# Patient Record
Sex: Male | Born: 1935
Health system: Southern US, Community
[De-identification: ages and names within clinical notes are randomized; demographics above are authoritative.]

## PROBLEM LIST (undated history)

## (undated) DIAGNOSIS — M1990 Unspecified osteoarthritis, unspecified site: Secondary | ICD-10-CM

## (undated) DIAGNOSIS — I10 Essential (primary) hypertension: Secondary | ICD-10-CM

## (undated) DIAGNOSIS — H269 Unspecified cataract: Secondary | ICD-10-CM

## (undated) DIAGNOSIS — J439 Emphysema, unspecified: Secondary | ICD-10-CM

## (undated) DIAGNOSIS — H409 Unspecified glaucoma: Secondary | ICD-10-CM

## (undated) HISTORY — PX: EYE SURGERY: SHX253

## (undated) HISTORY — DX: Unspecified cataract: H26.9

## (undated) HISTORY — DX: Emphysema, unspecified: J43.9

## (undated) HISTORY — DX: Unspecified glaucoma: H40.9

---
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
## 2001-01-08 HISTORY — PX: HERNIA REPAIR: SHX51

## 2002-05-15 ENCOUNTER — Ambulatory Visit (HOSPITAL_COMMUNITY): Admission: RE | Admit: 2002-05-15 | Discharge: 2002-05-15 | Payer: Self-pay | Admitting: General Surgery

## 2004-06-01 ENCOUNTER — Ambulatory Visit (HOSPITAL_COMMUNITY): Admission: RE | Admit: 2004-06-01 | Discharge: 2004-06-01 | Payer: Self-pay | Admitting: Gastroenterology

## 2004-10-27 ENCOUNTER — Encounter: Admission: RE | Admit: 2004-10-27 | Discharge: 2004-10-27 | Payer: Self-pay | Admitting: Internal Medicine

## 2009-11-18 ENCOUNTER — Ambulatory Visit: Payer: Self-pay | Admitting: Oncology

## 2009-12-09 LAB — CBC WITH DIFFERENTIAL/PLATELET
BASO%: 0.5 % (ref 0.0–2.0)
Basophils Absolute: 0 10*3/uL (ref 0.0–0.1)
EOS%: 4.1 % (ref 0.0–7.0)
Eosinophils Absolute: 0.3 10*3/uL (ref 0.0–0.5)
HCT: 41.3 % (ref 38.4–49.9)
HGB: 13.9 g/dL (ref 13.0–17.1)
LYMPH%: 17.4 % (ref 14.0–49.0)
MCH: 30 pg (ref 27.2–33.4)
MCHC: 33.6 g/dL (ref 32.0–36.0)
MCV: 89.4 fL (ref 79.3–98.0)
MONO#: 0.5 10*3/uL (ref 0.1–0.9)
MONO%: 8.4 % (ref 0.0–14.0)
NEUT#: 4.2 10*3/uL (ref 1.5–6.5)
NEUT%: 69.6 % (ref 39.0–75.0)
Platelets: 132 10*3/uL — ABNORMAL LOW (ref 140–400)
RBC: 4.62 10*6/uL (ref 4.20–5.82)
RDW: 13.4 % (ref 11.0–14.6)
WBC: 6.1 10*3/uL (ref 4.0–10.3)
lymph#: 1.1 10*3/uL (ref 0.9–3.3)

## 2009-12-09 LAB — TECHNOLOGIST REVIEW

## 2009-12-09 LAB — CHCC SMEAR

## 2010-05-26 NOTE — Op Note (Signed)
NAME:  [name], [name]              ACCOUNT NO.:  [id number]   MEDICAL RECORD NO.:  [id number]          PATIENT TYPE:  AMB   LOCATION:  ENDO                         FACILITY:  [facility]   PHYSICIAN:  Danise Edge, M.D.   DATE OF BIRTH:  [birth year]   DATE OF PROCEDURE:  06/01/2004  DATE OF DISCHARGE:                                 OPERATIVE REPORT   PROCEDURE:  Screening colonoscopy.   PROCEDURE INDICATIONS:  [name] is a 76 year old male born Nov 18, 1934. Danny Gross is scheduled to undergo his first screening  colonoscopy with polypectomy to prevent colon cancer.   ENDOSCOPIST:  Danise Edge, M.D.   PREMEDICATION:  Versed 4 mg and Demerol 60 mg.   PROCEDURE:  After obtaining informed consent, [name] was placed in the  left lateral decubitus position.  I administered intravenous Demerol and  intravenous Versed to achieve conscious sedation for the procedure. The  patient's blood pressure, oxygen saturation and cardiac rhythm were  monitored throughout the procedure and documented in the medical record.   Anal inspection was normal. Digital rectal exam reveals a non nodular  prostate. The Olympus adjustable pediatric colonoscope was introduced into  the rectum and advanced to the cecum. A normal-appearing ileocecal valve and  appendiceal orifice were identified. Colonic preparation for the exam today  was excellent.   Rectum normal.  Retroflexed view of the distal rectum normal.  Sigmoid colon and descending colon normal.  Splenic flexure normal.  Transverse colon normal.  Hepatic flexure normal.  Ascending colon normal.  Cecum and ileocecal valve normal.   ASSESSMENT:  Normal screening proctocolonoscopy to the cecum.      MJ/MEDQ  D:  06/01/2004  T:  06/01/2004  Job:  [id number]   cc:   Georgann Housekeeper, MD  [address]  [place]  [place] [address]  Fax: [phone]

## 2013-01-08 DIAGNOSIS — J189 Pneumonia, unspecified organism: Secondary | ICD-10-CM

## 2013-01-08 HISTORY — DX: Pneumonia, unspecified organism: J18.9

## 2013-06-17 ENCOUNTER — Other Ambulatory Visit: Payer: Self-pay | Admitting: Internal Medicine

## 2013-06-17 DIAGNOSIS — R911 Solitary pulmonary nodule: Secondary | ICD-10-CM

## 2013-07-01 ENCOUNTER — Ambulatory Visit
Admission: RE | Admit: 2013-07-01 | Discharge: 2013-07-01 | Disposition: A | Discharge status: Home / Self Care | Payer: Medicare PPO | Source: Ambulatory Visit | Attending: Internal Medicine | Admitting: Internal Medicine

## 2013-07-01 DIAGNOSIS — R911 Solitary pulmonary nodule: Secondary | ICD-10-CM

## 2013-07-13 ENCOUNTER — Ambulatory Visit (INDEPENDENT_AMBULATORY_CARE_PROVIDER_SITE_OTHER): Payer: Medicare PPO | Admitting: Internal Medicine

## 2013-07-13 ENCOUNTER — Encounter: Payer: Self-pay | Admitting: Internal Medicine

## 2013-07-13 ENCOUNTER — Other Ambulatory Visit (INDEPENDENT_AMBULATORY_CARE_PROVIDER_SITE_OTHER): Payer: Medicare PPO

## 2013-07-13 VITALS — BP 114/80 | HR 65 | Temp 97.9°F | Ht 70.0 in | Wt 142.0 lb

## 2013-07-13 DIAGNOSIS — R918 Other nonspecific abnormal finding of lung field: Secondary | ICD-10-CM

## 2013-07-13 LAB — CBC WITH DIFFERENTIAL/PLATELET
Basophils Absolute: 0 10*3/uL (ref 0.0–0.1)
Basophils Relative: 0.5 % (ref 0.0–3.0)
Eosinophils Absolute: 0.3 10*3/uL (ref 0.0–0.7)
Eosinophils Relative: 4.7 % (ref 0.0–5.0)
HCT: 41.6 % (ref 39.0–52.0)
Hemoglobin: 13.6 g/dL (ref 13.0–17.0)
Lymphocytes Relative: 18.1 % (ref 12.0–46.0)
Lymphs Abs: 1.1 10*3/uL (ref 0.7–4.0)
MCHC: 32.7 g/dL (ref 30.0–36.0)
MCV: 89.1 fl (ref 78.0–100.0)
Monocytes Absolute: 0.6 10*3/uL (ref 0.1–1.0)
Monocytes Relative: 9.7 % (ref 3.0–12.0)
Neutro Abs: 4 10*3/uL (ref 1.4–7.7)
Neutrophils Relative %: 67 % (ref 43.0–77.0)
Platelets: 120 10*3/uL — ABNORMAL LOW (ref 150.0–400.0)
RBC: 4.67 Mil/uL (ref 4.22–5.81)
RDW: 14 % (ref 11.5–15.5)
WBC: 6 10*3/uL (ref 4.0–10.5)

## 2013-07-13 LAB — SEDIMENTATION RATE: Sed Rate: 14 mm/hr (ref 0–22)

## 2013-07-13 NOTE — Patient Instructions (Addendum)
Please remember to go to the lab department downstairs for your tests - we will call you with the results when they are available.  Please schedule a follow up office visit in 2 weeks, sooner if needed with a cxr. Late add :  cxr's since onset of illness needed on disc with f/u ov

## 2013-07-13 NOTE — Progress Notes (Signed)
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 Pulmonary Consult    Referred per Danny. Christene SlatesKarar Gross for eval of MPN. Pt states had PNA in June 2015.  He c/o minimal cough- prod with minimal tan sputum.   winter 2014 bad bronchitis 100% better Dec 2014 bad bronchitis rx ? zpak > better but nagging cough then started taking allergy meds in May 2015 for fatgue he assoc with gardening  no better>   then abruptly felt ill when tried to get up from prolonged sitting during  first week in June main c/o was weak legs, but also noted   Pain both sides of lower chest and back, and fever 101 > UC rx avelox > 100% better including nagging cough better but fatigue 75% better at most. Appetite back to baseline  No obvious other patterns in day to day or daytime variabilty or assoc chronic cough or cp or chest tightness, subjective wheeze overt sinus or hb symptoms. No unusual exp hx or h/o childhood pna/ asthma or knowledge of premature birth.  Sleeping ok without nocturnal  or early am exacerbation  of respiratory  c/o's or need for noct saba. Also denies any obvious fluctuation of symptoms with weather or environmental changes or other aggravating or alleviating factors except as outlined above   Current Medications, Allergies, Complete Past Medical History, Past Surgical History, Family History, and Social History were reviewed in [facility] electronic medical record.           Review of Systems  Constitutional: Positive for appetite change and unexpected weight change. Negative for fever, chills and activity change.  HENT: Negative for congestion, dental problem, postnasal drip, rhinorrhea, sneezing, sore throat, trouble  swallowing and voice change.   Eyes: Negative for visual disturbance.  Respiratory: Positive for cough. Negative for choking and shortness of breath.   Cardiovascular: Negative for chest pain and leg swelling.  Gastrointestinal: Negative for nausea, vomiting and abdominal pain.  Genitourinary: Negative for difficulty urinating.  Musculoskeletal: Negative for arthralgias.  Skin: Negative for rash.  Psychiatric/Behavioral: Negative for behavioral problems and confusion.       Objective:   Physical Exam   Somber thin amb wm nad  Wt Readings from Last 3 Encounters:  07/13/13 142 lb (64.411 kg)      HEENT: nl dentition, turbinates, and orophanx. Nl external ear canals without cough reflex   NECK :  without JVD/Nodes/TM/ nl carotid upstrokes bilaterally   LUNGS: no acc muscle use, unusual pectus deformity bilaterally abut clear to A and P bilaterally without cough on insp or exp maneuvers   CV:  RRR  no s3 or murmur or increase in P2, no edema   ABD:  soft and nontender with nl excursion in the supine position. No bruits or organomegaly, bowel sounds nl  MS:  warm without deformities, calf tenderness, cyanosis or clubbing  SKIN: warm and dry without lesions    NEURO:  alert, approp, no deficits    CT chest w/o contrast 06/17/13 1. Opacity within the left lower lobe with a nodular character with  multiple nodules scattered throughout the lungs as well. These  findings could  be due to an infectious or inflammatory process, but  a malignancy cannot be excluded. If this does not respond  appropriately to therapy, then bronchoscopy and/or percutaneous  biopsy may be warranted.  2. No definite mediastinal or hilar adenopathy            Assessment & Plan:

## 2013-07-13 NOTE — Progress Notes (Signed)
Quick Note:  Spoke with pt and notified of results per Dr. Wert. Pt verbalized understanding and denied any questions.  ______ 

## 2013-07-13 NOTE — Assessment & Plan Note (Addendum)
-  See CT chest 07/01/13  - ESR 07/13/2013 = 14  In the absence of a smoking hx and given abrupt onset of febrile illness that appears to have responded nicely to avelox these changes on Ct are most c/w organizing pna that is improving on it's own and should not require further w/u or rx assuming he goes back to 100% baseline fnx and we see radiographic improvement (though perhaps not to 100%) esp in the  RML  being difficult to completely clear p CAP.  Since we have an abn baseline cxr then just need to do apples to apples comparison and forego the CT's for now  Discussed in detail all the  indications, usual  risks and alternatives  relative to the benefits with patient who agrees to proceed with conservative f/u as outlined  See instructions for specific recommendations which were reviewed directly with the patient who was given a copy with highlighter outlining the key components.

## 2013-07-14 ENCOUNTER — Telehealth: Payer: Self-pay | Admitting: *Deleted

## 2013-07-14 NOTE — Telephone Encounter (Signed)
Spoke with pt and notified of recs per Dr. Sherene SiresWert. Pt verbalized understanding and denied any questions. Will try and bring cd with cxrs

## 2013-07-14 NOTE — Telephone Encounter (Signed)
Message copied by Christen ButterASKIN, Johnross Nabozny M on Tue Jul 14, 2013  3:57 PM ------      Message from: Sandrea HughsWERT, MICHAEL B      Created: Mon Jul 13, 2013 10:52 PM       If possible we need the previous cxr's on disc at next ov (the ones done when he was sick) for apples to apples comparison ------

## 2013-07-27 ENCOUNTER — Ambulatory Visit (INDEPENDENT_AMBULATORY_CARE_PROVIDER_SITE_OTHER): Payer: Medicare PPO | Admitting: Internal Medicine

## 2013-07-27 ENCOUNTER — Other Ambulatory Visit (INDEPENDENT_AMBULATORY_CARE_PROVIDER_SITE_OTHER): Payer: Medicare PPO

## 2013-07-27 ENCOUNTER — Encounter: Payer: Self-pay | Admitting: Internal Medicine

## 2013-07-27 ENCOUNTER — Ambulatory Visit (INDEPENDENT_AMBULATORY_CARE_PROVIDER_SITE_OTHER)
Admission: RE | Admit: 2013-07-27 | Discharge: 2013-07-27 | Disposition: A | Discharge status: Home / Self Care | Payer: Medicare PPO | Source: Ambulatory Visit | Attending: Internal Medicine | Admitting: Internal Medicine

## 2013-07-27 VITALS — BP 120/80 | HR 60 | Temp 97.7°F | Ht 70.0 in | Wt 142.0 lb

## 2013-07-27 DIAGNOSIS — R0600 Dyspnea, unspecified: Secondary | ICD-10-CM | POA: Insufficient documentation

## 2013-07-27 DIAGNOSIS — R0989 Other specified symptoms and signs involving the circulatory and respiratory systems: Secondary | ICD-10-CM

## 2013-07-27 DIAGNOSIS — R918 Other nonspecific abnormal finding of lung field: Secondary | ICD-10-CM

## 2013-07-27 DIAGNOSIS — R0609 Other forms of dyspnea: Secondary | ICD-10-CM

## 2013-07-27 DIAGNOSIS — Z23 Encounter for immunization: Secondary | ICD-10-CM

## 2013-07-27 LAB — BASIC METABOLIC PANEL
BUN: 26 mg/dL — ABNORMAL HIGH (ref 6–23)
CALCIUM: 9.4 mg/dL (ref 8.4–10.5)
CO2: 29 mEq/L (ref 19–32)
Chloride: 104 mEq/L (ref 96–112)
Creatinine, Ser: 1.2 mg/dL (ref 0.4–1.5)
GFR: 60.66 mL/min (ref >60.00)
GLUCOSE: 105 mg/dL — AB (ref 70–99)
Potassium: 5.2 mEq/L — ABNORMAL HIGH (ref 3.5–5.1)
Sodium: 139 mEq/L (ref 135–145)

## 2013-07-27 LAB — TSH: TSH: 1.29 u[IU]/mL (ref 0.35–4.50)

## 2013-07-27 LAB — PROTIME-INR
INR: 1.1 ratio — AB (ref 0.8–1.0)
Prothrombin Time: 12 s (ref 9.6–13.1)

## 2013-07-27 LAB — BRAIN NATRIURETIC PEPTIDE: Pro B Natriuretic peptide (BNP): 138 pg/mL — ABNORMAL HIGH (ref 0.0–100.0)

## 2013-07-27 NOTE — Patient Instructions (Addendum)
Prevnar 13 today   Please remember to go to the lab  department downstairs for your tests - we will call you with the results when they are available.  Tuesday Aug 4th 715 am [facility] outpatient registration with nothing to eat after supper Monday night for bronchoscopy and biopsy.

## 2013-07-27 NOTE — Progress Notes (Signed)
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 Pulmonary Consult    Referred per Dr. Christene SlatesKarar Hussain for eval of MPN. Pt states had PNA in June 2015.  He c/o minimal cough- prod with minimal tan sputum.   winter 2014 bad bronchitis 100% better Dec 2014 bad bronchitis rx ? zpak > better but nagging dry cough then started taking allergy meds in May 2015 for fatgue he assoc with gardening  no better>   then abruptly felt ill when tried to get up from prolonged sitting during  first week in June main c/o was weak legs, but also noted   Pain both sides of lower chest and back, and fever 101 > UC rx avelox > 100% better including nagging cough better but fatigue 75% better at most. Appetite back to baseline. rec Recheck 2 weeks, no change rx      07/27/2013 f/u ov/Arrion Burruel re: chronic cough/ abn cxr  Chief Complaint  Patient presents with  . Follow-up    Cough is unchanged since last visit.   75% improvement,  Remaining 25% = dry cough day = night, not back jogging yet but Not limited by breathing from desired activities    No obvious day to day or daytime variabilty or assoc chronic cough or cp or chest tightness, subjective wheeze overt sinus or hb symptoms. No unusual exp hx or h/o childhood pna/ asthma or knowledge of premature birth.  Sleeping ok without nocturnal  or early am exacerbation  of respiratory  c/o's or need for noct saba. Also denies any obvious fluctuation of symptoms with weather or environmental changes or other aggravating or alleviating factors except as outlined above   Current Medications, Allergies, Complete Past Medical History, Past Surgical History, Family History, and Social  History were reviewed in [facility] electronic medical record.  ROS  The following are not active complaints unless bolded sore throat, dysphagia, dental problems, itching, sneezing,  nasal congestion or excess/ purulent secretions, ear ache,   fever, chills, sweats, unintended wt loss, pleuritic or exertional cp, hemoptysis,  orthopnea pnd or leg swelling, presyncope, palpitations, heartburn, abdominal pain, anorexia, nausea, vomiting, diarrhea  or change in bowel or urinary habits, change in stools or urine, dysuria,hematuria,  rash, arthralgias, visual complaints, headache, numbness weakness or ataxia or problems with walking or coordination,  change in mood/affect or memory.                      Objective:   Physical Exam   Somber thin amb wm nad   Wt Readings from Last 3 Encounters:  07/27/13 142 lb (64.411 kg)  07/13/13 142 lb (64.411 kg)       HEENT: nl dentition, turbinates, and orophanx. Nl external ear canals without cough reflex   NECK :  without JVD/Nodes/TM/ nl carotid upstrokes bilaterally   LUNGS: no acc muscle use, unusual pectus deformity bilaterally abut clear to A and P bilaterally without cough on insp or exp maneuvers   CV:  RRR  no s3 or murmur or increase in P2, no edema   ABD:  soft and nontender with nl excursion  in the supine position. No bruits or organomegaly, bowel sounds nl  MS:  warm without deformities, calf tenderness, cyanosis or clubbing        cxr 06/13/13  LLL vague infiltrate ?post nodule   CXR  07/27/2013 :  Persistent abnormal lung densities in the right middle lobe and left lower lobe, probably similar to the appearance on the previous CT scan. There is probably bronchiectasis and chronic pulmonary inflammation       Lab Results  Component Value Date   ESRSEDRATE 14 07/13/2013                Assessment & Plan:

## 2013-07-28 NOTE — Assessment & Plan Note (Signed)
Chemistry      Component Value Date/Time   NA 139 07/27/2013 1131   K 5.2* 07/27/2013 1131   CL 104 07/27/2013 1131   CO2 29 07/27/2013 1131   BUN 26* 07/27/2013 1131   CREATININE 1.2 07/27/2013 1131      Component Value Date/Time   CALCIUM 9.4 07/27/2013 1131      Lab Results  Component Value Date   WBC 6.0 07/13/2013   HGB 13.6 07/13/2013   HCT 41.6 07/13/2013   MCV 89.1 07/13/2013   PLT 120.0* 07/13/2013    Lab Results  Component Value Date   TSH 1.29 07/27/2013     Lab Results  Component Value Date   PROBNP 138.0* 07/27/2013     The plt problem is longstanding and not an explanation for fatigue or sob. No evidence of chf

## 2013-07-28 NOTE — Assessment & Plan Note (Signed)
-  See CT chest 07/01/13  - ESR 07/13/2013 = 14  He has no baseline cxr and still coughing, fatigued p recurrent  "cap" typical of bronchiectasis but this is not a prominent finding on CT  Discussed in detail all the  indications, usual  risks and alternatives  relative to the benefits with patient who agrees to proceed with bronchoscopy with biopsy on Aug 4

## 2013-08-11 ENCOUNTER — Ambulatory Visit (HOSPITAL_COMMUNITY): Payer: Medicare PPO

## 2013-08-11 ENCOUNTER — Encounter (INDEPENDENT_AMBULATORY_CARE_PROVIDER_SITE_OTHER): Payer: Self-pay

## 2013-08-11 ENCOUNTER — Encounter (HOSPITAL_COMMUNITY)
Admission: RE | Disposition: A | Discharge status: Home / Self Care | Payer: Self-pay | Source: Ambulatory Visit | Attending: Critical Care Medicine

## 2013-08-11 ENCOUNTER — Encounter (HOSPITAL_COMMUNITY): Payer: Self-pay | Admitting: Respiratory Therapy

## 2013-08-11 ENCOUNTER — Telehealth: Payer: Self-pay | Admitting: Internal Medicine

## 2013-08-11 ENCOUNTER — Other Ambulatory Visit: Payer: Self-pay | Admitting: Internal Medicine

## 2013-08-11 ENCOUNTER — Encounter (HOSPITAL_COMMUNITY)
Admission: RE | Disposition: A | Discharge status: Home / Self Care | Payer: Medicare PPO | Source: Ambulatory Visit | Attending: Critical Care Medicine

## 2013-08-11 ENCOUNTER — Encounter: Payer: Self-pay | Admitting: Internal Medicine

## 2013-08-11 ENCOUNTER — Ambulatory Visit (HOSPITAL_COMMUNITY)
Admission: RE | Admit: 2013-08-11 | Discharge: 2013-08-11 | Disposition: A | Discharge status: Home / Self Care | Payer: Medicare PPO | Source: Ambulatory Visit | Attending: Critical Care Medicine | Admitting: Critical Care Medicine

## 2013-08-11 ENCOUNTER — Ambulatory Visit (HOSPITAL_COMMUNITY)
Admission: RE | Admit: 2013-08-11 | Discharge: 2013-08-11 | Disposition: A | Discharge status: Home / Self Care | Payer: Medicare PPO | Source: Ambulatory Visit | Attending: Internal Medicine | Admitting: Internal Medicine

## 2013-08-11 DIAGNOSIS — R05 Cough: Secondary | ICD-10-CM | POA: Insufficient documentation

## 2013-08-11 DIAGNOSIS — J984 Other disorders of lung: Secondary | ICD-10-CM | POA: Diagnosis not present

## 2013-08-11 DIAGNOSIS — R059 Cough, unspecified: Secondary | ICD-10-CM | POA: Diagnosis not present

## 2013-08-11 DIAGNOSIS — R918 Other nonspecific abnormal finding of lung field: Secondary | ICD-10-CM

## 2013-08-11 HISTORY — PX: VIDEO BRONCHOSCOPY: SHX5072

## 2013-08-11 LAB — BODY FLUID CELL COUNT WITH DIFFERENTIAL
Eos, Fluid: 2 %
Lymphs, Fluid: 8 %
Monocyte-Macrophage-Serous Fluid: 9 % — ABNORMAL LOW (ref 50–90)
Neutrophil Count, Fluid: 81 % — ABNORMAL HIGH (ref 0–25)
WBC FLUID: 4410 uL — AB (ref 0–1000)

## 2013-08-11 SURGERY — BRONCHOSCOPY, WITH FLUOROSCOPY
Anesthesia: Moderate Sedation | Laterality: Bilateral

## 2013-08-11 MED ORDER — MIDAZOLAM HCL 10 MG/2ML IJ SOLN: 1.0000 mg | Freq: Once | INTRAMUSCULAR | Status: DC

## 2013-08-11 MED ORDER — LEVOFLOXACIN 500 MG PO TABS: 500.0000 mg | ORAL_TABLET | Freq: Every day | ORAL | Status: DC

## 2013-08-11 MED ORDER — LIDOCAINE HCL 1 % IJ SOLN
INTRAMUSCULAR | Status: DC | PRN
  Administered 2013-08-11: 6 mL via RESPIRATORY_TRACT

## 2013-08-11 MED ORDER — MEPERIDINE HCL 100 MG/ML IJ SOLN: 100.0000 mg | Freq: Once | INTRAMUSCULAR | Status: DC

## 2013-08-11 MED ORDER — PHENYLEPHRINE HCL 0.25 % NA SOLN: 1.0000 | Freq: Four times a day (QID) | NASAL | Status: DC | PRN

## 2013-08-11 MED ORDER — LIDOCAINE HCL 2 % EX GEL: 1.0000 "application " | Freq: Once | CUTANEOUS | Status: DC

## 2013-08-11 MED ORDER — LIDOCAINE HCL 2 % EX GEL
CUTANEOUS | Status: DC | PRN
  Administered 2013-08-11: 1

## 2013-08-11 MED ORDER — PHENYLEPHRINE HCL 0.25 % NA SOLN
NASAL | Status: DC | PRN
  Administered 2013-08-11: 1 via NASAL

## 2013-08-11 MED ORDER — SODIUM CHLORIDE 0.9 % IV SOLN
INTRAVENOUS | Status: DC
  Administered 2013-08-11: 08:00:00 via INTRAVENOUS

## 2013-08-11 MED ORDER — MIDAZOLAM HCL 10 MG/2ML IJ SOLN
INTRAMUSCULAR | Status: AC
  Filled 2013-08-11: qty 4

## 2013-08-11 MED ORDER — MEPERIDINE HCL 100 MG/ML IJ SOLN
INTRAMUSCULAR | Status: AC
  Filled 2013-08-11: qty 2

## 2013-08-11 MED ORDER — MIDAZOLAM HCL 10 MG/2ML IJ SOLN
INTRAMUSCULAR | Status: DC | PRN
  Administered 2013-08-11: 2.5 mg via INTRAVENOUS

## 2013-08-11 NOTE — Discharge Instructions (Signed)
°  Flexible Bronchoscopy, Care After These instructions give you information on caring for yourself after your procedure. Your doctor may also give you more specific instructions. Call your doctor if you have any problems or questions after your procedure. HOME CARE  Do not eat or drink anything for 2 hours after your procedure. If you try to eat or drink before the medicine wears off, food or drink could go into your lungs. You could also burn yourself.  After 2 hours have passed and when you can cough and gag normally, you may eat soft food and drink liquids slowly.  The day after the test, you may eat your normal diet.  You may do your normal activities.  Keep all doctor visits. GET HELP RIGHT AWAY IF:  You get more and more short of breath.  You get light-headed.  You feel like you are going to pass out (faint).  You have chest pain.  You have new problems that worry you.  You cough up more than a little blood.  You cough up more blood than before. MAKE SURE YOU:  Understand these instructions.  Will watch your condition.  Will get help right away if you are not doing well or get worse. Document Released: 10/22/2008 Document Revised: 12/30/2012 Document Reviewed: 08/29/2012 [facility] Patient Information 2015 [employer] [place]. This information is not intended to replace advice given to you by your health care provider. Make sure you discuss any questions you have with your health care provider.  Nothing to eat or drink until   11:00 AM   Today 08/11/2013

## 2013-08-11 NOTE — Telephone Encounter (Signed)
Called made pt aware of recs. Nothing further needed 

## 2013-08-11 NOTE — Telephone Encounter (Signed)
Called spoke with Harol. She reports about 1/2 hr ago pt has had fever of 101.2. Pt has not taken anything for the fever bc they were told pt could not take any medications until MW gave the okay. Please advise thanks

## 2013-08-11 NOTE — H&P (Signed)
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  Pulmonary Consult       Referred per Dr. Lacie Scotts for eval of MPN. Pt states had PNA in June 2015.  He c/o minimal cough- prod with minimal tan sputum.     winter 2014 bad bronchitis 100% better Dec 2014 bad bronchitis rx ? zpak > better but nagging dry cough then started taking allergy meds in May 2015 for fatgue he assoc with gardening  no better>   then abruptly felt ill when tried to get up from prolonged sitting during  first week in June main c/o was weak legs, but also noted   Pain both sides of lower chest and back, and fever 101 > UC rx avelox > 100% better including nagging cough better but fatigue 75% better at most. Appetite back to baseline. rec Recheck 2 weeks, no change rx         07/27/2013 f/u ov/Danny Gross re: chronic cough/ abn cxr   Chief Complaint   Patient presents with   .  Follow-up       Cough is unchanged since last visit.     75% improvement,  Remaining 25% = dry cough day = night, not back jogging yet but Not limited by breathing from desired activities     No obvious day to day or daytime variabilty or assoc chronic cough or cp or chest tightness, subjective wheeze overt sinus or hb symptoms. No unusual exp hx or h/o childhood pna/ asthma or knowledge of premature birth.   Sleeping ok without nocturnal  or early am exacerbation  of respiratory  c/o's or need for noct saba. Also denies any obvious fluctuation of symptoms with weather or environmental changes or other aggravating or alleviating factors except as outlined above    Current Medications, Allergies, Complete Past Medical History, Past Surgical History, Family History, and Social History were reviewed in [employer] record.   ROS  The following are not active complaints unless bolded sore throat, dysphagia, dental problems, itching, sneezing,  nasal congestion or excess/ purulent secretions, ear ache,   fever, chills, sweats, unintended wt loss, pleuritic or exertional cp, hemoptysis,  orthopnea pnd or leg swelling, presyncope, palpitations, heartburn, abdominal pain, anorexia, nausea, vomiting, diarrhea  or change in bowel or urinary habits, change in stools or urine, dysuria,hematuria,  rash, arthralgias, visual complaints, headache, numbness weakness or ataxia or problems with walking or coordination,  change in mood/affect or memory.                            Objective:     Physical Exam     Somber thin amb wm nad      Wt Readings from Last 3 Encounters:   07/27/13  142 lb (64.411 kg)   07/13/13  142 lb (64.411 kg)           HEENT: nl dentition, turbinates, and orophanx. Nl external ear canals without cough reflex     NECK :  without JVD/Nodes/TM/ nl carotid upstrokes bilaterally     LUNGS: no acc muscle use, unusual pectus deformity bilaterally abut clear to A and P bilaterally without cough  on insp or exp maneuvers     CV:  RRR  no s3 or murmur or increase in P2, no edema    ABD:  soft and nontender with nl excursion in the supine position. No bruits or organomegaly, bowel sounds nl   MS:  warm without deformities, calf tenderness, cyanosis or clubbing            cxr 06/13/13   LLL vague infiltrate ?post nodule     CXR  07/27/2013 :  Persistent abnormal lung densities in the right middle lobe and left lower lobe, probably similar to the appearance on the previous CT scan. There is probably bronchiectasis and chronic pulmonary inflammation           Lab Results   Component  Value  Date     ESRSEDRATE  14  07/13/2013                           Assessment & Plan:               Pulmonary infiltrates     - ESR  07/13/2013 = 14  He has no baseline cxr and still coughing, fatigued p recurrent  "cap" typical of bronchiectasis but this is not a prominent finding on CT   Discussed in detail all the  indications, usual  risks and alternatives  relative to the benefits with patient who agrees to proceed with bronchoscopy with biopsy on Aug 4    08/11/2013 day of FOB/ no change in hx or exam   Christinia Gully, MD Pulmonary and [place] [phone] After 5:30 PM or weekends, call [phone]

## 2013-08-11 NOTE — Telephone Encounter (Signed)
Fine to take tylenol and plenty of fluids but this is still not at all unexpected and on levaquin there are no bugs that aresn't well covered.

## 2013-08-11 NOTE — Telephone Encounter (Signed)
Called and spoke with Danny Gross and she stated that the pt has a  Temp now of 102.0 and wanted to know what she can give him for this.  MW please advise. thanks

## 2013-08-11 NOTE — Progress Notes (Signed)
Video Bronchoscopy done  Intervention Bronchial washing done Intervention Bronchial biopsy done  Procedure tolerated well 

## 2013-08-11 NOTE — Op Note (Signed)
Bronchoscopy Procedure Note  Date of Operation: 08/11/2013   Pre-op Diagnosis: ? MAI  Post-op Diagnosis: same  Surgeon: Sandrea HughsMichael Maurene Hollin  Anesthesia: Monitored Local Anesthesia with Sedation  Operation: Video Flexible fiberoptic bronchoscopy, diagnostic   Findings: copious bilateral mucopurulent secretions  Specimen: BAL LLL and Tbbx LLL  Estimated Blood Loss: min   Complications: None  Indications and History: See updated H and P same date. The risks, benefits, complications, treatment options and expected outcomes were discussed with the patient.  The possibilities of reaction to medication, pulmonary aspiration, perforation of a viscus, bleeding, failure to diagnose a condition and creating a complication requiring transfusion or operation were discussed with the patient who freely signed the consent.    Description of Procedure: The patient was re-examined in the bronchoscopy suite and the site of surgery properly noted/marked.  The patient was identified  and the procedure verified as Flexible Fiberoptic Bronchoscopy.  A Time Out was held and the above information confirmed.   After the induction of topical nasopharyngeal anesthesia, the patient was positioned  and the bronchoscope was passed through the R naris. The vocal cords were visualized and  1% buffered lidocaine 5 ml was topically placed onto the cords. The cords were nl. The scope was then passed into the trachea.  1% buffered lidocaine given topically. Airways inspected bilaterally to the subsegmental level with the following findings:   1) copious bilateral mp secretions  2) no focal lesions   Interventions  1) BAL LLL with bloody return  2) Tbbx LLL x 4 by fluoroscopy      The Patient was taken to the Endoscopy Recovery area in satisfactory condition.  Attestation: I performed the procedure.  Sandrea HughsMichael Theresia Pree, MD Pulmonary and Critical Care Medicine [facility] Cell [phone] After 5:30 PM or  weekends, call [phone]

## 2013-08-11 NOTE — Telephone Encounter (Signed)
This is not unusual but given the nasty color of his mucus rec levaquin 500 mg daily x 10 days

## 2013-08-12 ENCOUNTER — Encounter: Payer: Self-pay | Admitting: Internal Medicine

## 2013-08-12 ENCOUNTER — Encounter (HOSPITAL_COMMUNITY): Payer: Self-pay | Admitting: Internal Medicine

## 2013-08-12 ENCOUNTER — Other Ambulatory Visit: Payer: Self-pay | Admitting: Internal Medicine

## 2013-08-12 DIAGNOSIS — R0600 Dyspnea, unspecified: Secondary | ICD-10-CM

## 2013-08-12 NOTE — Telephone Encounter (Signed)
Dr Sherene SiresWert please advise [facility] [place] [facility] that you would recommend the patient see. Thanks.

## 2013-08-13 LAB — CULTURE, RESPIRATORY W GRAM STAIN

## 2013-08-13 LAB — CULTURE, RESPIRATORY

## 2013-08-14 ENCOUNTER — Encounter: Payer: Self-pay | Admitting: Internal Medicine

## 2013-08-14 NOTE — Telephone Encounter (Signed)
Please advise MW thanks 

## 2013-08-14 NOTE — Progress Notes (Signed)
Quick Note:  LMTCB ______ 

## 2013-08-17 NOTE — Progress Notes (Signed)
Quick Note:  LMTCB ______ 

## 2013-08-19 ENCOUNTER — Encounter: Payer: Self-pay | Admitting: Internal Medicine

## 2013-08-19 ENCOUNTER — Encounter: Payer: Self-pay | Admitting: *Deleted

## 2013-08-19 ENCOUNTER — Ambulatory Visit (INDEPENDENT_AMBULATORY_CARE_PROVIDER_SITE_OTHER): Payer: Medicare PPO | Admitting: Internal Medicine

## 2013-08-19 VITALS — BP 112/72 | HR 70 | Temp 97.9°F | Ht 70.0 in | Wt 138.0 lb

## 2013-08-19 DIAGNOSIS — R918 Other nonspecific abnormal finding of lung field: Secondary | ICD-10-CM

## 2013-08-19 MED ORDER — AZITHROMYCIN 250 MG PO TABS: ORAL_TABLET | ORAL | Status: DC

## 2013-08-19 MED ORDER — ETHAMBUTOL HCL 400 MG PO TABS: ORAL_TABLET | ORAL | Status: DC

## 2013-08-19 MED ORDER — FLUTTER DEVI: Status: DC

## 2013-08-19 MED ORDER — ETHAMBUTOL HCL 400 MG PO TABS: 400.0000 mg | ORAL_TABLET | Freq: Every day | ORAL | Status: DC

## 2013-08-19 NOTE — Progress Notes (Signed)
Quick Note:  Will discuss at ov today ______ 

## 2013-08-19 NOTE — Patient Instructions (Addendum)
Zithromax 250 mg one daily   mucinex or mucinex dm up 1200 mg every 12 hours as needed and use your flutter valve  Ethambutol 400 mg twice daily (let your eye doctor know)  Please schedule a follow up office visit in 4 weeks, sooner if needed with pfts and cxr   You should not travel by airplane in meantime

## 2013-08-19 NOTE — Progress Notes (Signed)
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 Pulmonary Consult    Referred per Dr. Christene SlatesKarar Hussain for eval of MPN. Pt states had PNA in June 2015.  He c/o minimal cough- prod with minimal tan sputum.   winter 2014 bad bronchitis 100% better Dec 2014 bad bronchitis rx ? zpak > better but nagging dry cough then started taking allergy meds in May 2015 for fatgue he assoc with gardening  no better>   then abruptly felt ill when tried to get up from prolonged sitting during  first week in June main c/o was weak legs, but also noted   Pain both sides of lower chest and back, and fever 101 > UC rx avelox > 100% better including nagging cough better but fatigue 75% better at most. Appetite back to baseline. rec Recheck 2 weeks, no change rx      07/27/2013 f/u ov/Makaylen Thieme re: chronic cough/ abn cxr  Chief Complaint  Patient presents with  . Follow-up    Cough is unchanged since last visit.   75% improvement,  Remaining 25% = dry cough day = night, not back jogging yet but Not limited by breathing from desired activities   rec Prevnar13 Aug 4 fob > Pos afb/ Pos atypcial cyt with fever 102 > levaquin x 10d    08/19/2013 f/u ov/Laquanda Bick re: Vivi Barrackprob mai  Chief Complaint  Patient presents with  . Follow-up    F/u after bronch. Pt c/o lack of energy. Pt states he has had a fever above 99 degrees since bronch on 8/4 and c/o sweats. Pt states he was afebrile today. Pt c/o prod cough with tan mucous. Denies CP.   finishing up levaquin, mucus much less vol since fob, bloody sputum clearing / no cp and no more fever   No obvious day to day or daytime variabilty or assoc limiting sob or cp or chest tightness,  subjective wheeze overt sinus or hb symptoms. No unusual exp hx or h/o childhood pna/ asthma or knowledge of premature birth.  Sleeping ok without nocturnal  or early am exacerbation  of respiratory  c/o's or need for noct saba. Also denies any obvious fluctuation of symptoms with weather or environmental changes or other aggravating or alleviating factors except as outlined above   Current Medications, Allergies, Complete Past Medical History, Past Surgical History, Family History, and Social History were reviewed in [facility] electronic medical record.  ROS  The following are not active complaints unless bolded sore throat, dysphagia, dental problems, itching, sneezing,  nasal congestion or excess/ purulent secretions, ear ache,   fever, chills, sweats, unintended wt loss, pleuritic or exertional cp, hemoptysis,  orthopnea pnd or leg swelling, presyncope, palpitations, heartburn, abdominal pain, anorexia, nausea, vomiting, diarrhea  or change in bowel or urinary habits, change in stools or urine, dysuria,hematuria,  rash, arthralgias, visual complaints, headache, numbness weakness or ataxia or problems with walking or coordination,  change in mood/affect or memory.                      Objective:   Physical Exam   Somber thin amb wm  nad  Wt Readings from Last 3 Encounters:  08/19/13 138 lb (62.596 kg)  07/27/13 142 lb (64.411 kg)  07/13/13 142 lb (64.411 kg)        HEENT: nl dentition, turbinates, and orophanx. Nl external ear canals without cough reflex   NECK :  without JVD/Nodes/TM/ nl carotid upstrokes bilaterally   LUNGS: no acc muscle use, unusual pectus deformity bilaterally abut clear to A and P bilaterally without cough on insp or exp maneuvers   CV:  RRR  no s3 or murmur or increase in P2, no edema   ABD:  soft and nontender with nl excursion in the supine position. No bruits or organomegaly, bowel sounds nl  MS:  warm without deformities, calf  tenderness, cyanosis or clubbing        cxr 06/13/13  LLL vague infiltrate ?post nodule   CXR  07/27/2013 :  Persistent abnormal lung densities in the right middle lobe and left lower lobe, probably similar to the appearance on the previous CT scan. There is probably bronchiectasis and chronic pulmonary inflammation                    Assessment & Plan:

## 2013-08-19 NOTE — Assessment & Plan Note (Signed)
-  See CT chest 07/01/13  - ESR 07/13/2013 = 14 - FOB 08/11/2013 > copious mp secretions bilaterally > WBC 4410 with 80% polys / 2% eos> 2+ AFB/ small cluster atypical cells/never smoker - Levaquin rx 8/4-14/2015  Most likely this is MAI  I had an extended discussion with the patient today lasting 15 to 20 minutes of a 25 minute visit on the following issues:   1)  MAI pathophys/ not contagious but need to be sure this is atpical tb before travel/ crowd exp 2) Need to promote better mc fxn> flutter valve 3) start zmax/ eth but consider refer to ID if any bug other than MAI, possible 2-3 y 4) need to be sure the infiltrates and clinical syndrome improve on approp rx and if not consider excisional bx as have not proven benignancy  See instructions for specific recommendations which were reviewed directly with the patient who was given a copy with highlighter outlining the key components.

## 2013-08-20 ENCOUNTER — Encounter: Payer: Self-pay | Admitting: Internal Medicine

## 2013-08-24 ENCOUNTER — Encounter: Payer: Self-pay | Admitting: Internal Medicine

## 2013-08-24 NOTE — Telephone Encounter (Signed)
Good afternoon! In order to better assist you I need more information. I am not sure what you mean by " you will be off of MyChart next week?" Could you please clarify exactly what is needed. I do see that you have an appointment for a PFT and visit with Dr. Sherene SiresWert on 9/11. I hope you have a great day! Than you!

## 2013-08-28 ENCOUNTER — Encounter: Payer: Self-pay | Admitting: Internal Medicine

## 2013-08-28 NOTE — Telephone Encounter (Signed)
Please advise MW thanks 

## 2013-08-31 ENCOUNTER — Encounter: Payer: Self-pay | Admitting: Internal Medicine

## 2013-08-31 LAB — FUNGUS CULTURE W SMEAR
FUNGAL SMEAR: NONE SEEN
Special Requests: NORMAL

## 2013-09-05 ENCOUNTER — Encounter: Payer: Self-pay | Admitting: Internal Medicine

## 2013-09-09 ENCOUNTER — Encounter: Payer: Self-pay | Admitting: Internal Medicine

## 2013-09-09 LAB — AFB CULTURE WITH SMEAR (NOT AT ARMC): SPECIAL REQUESTS: NORMAL

## 2013-09-10 ENCOUNTER — Other Ambulatory Visit: Payer: Self-pay | Admitting: Internal Medicine

## 2013-09-10 DIAGNOSIS — R918 Other nonspecific abnormal finding of lung field: Secondary | ICD-10-CM

## 2013-09-10 NOTE — Progress Notes (Signed)
Quick Note:  Spoke with pt and notified of results per Dr. Wert. Pt verbalized understanding and denied any questions.  ______ 

## 2013-09-10 NOTE — Progress Notes (Signed)
Quick Note:  LMTCB ______ 

## 2013-09-14 ENCOUNTER — Encounter: Payer: Self-pay | Admitting: Internal Medicine

## 2013-09-17 ENCOUNTER — Other Ambulatory Visit: Payer: Self-pay | Admitting: Internal Medicine

## 2013-09-17 DIAGNOSIS — R918 Other nonspecific abnormal finding of lung field: Secondary | ICD-10-CM

## 2013-09-18 ENCOUNTER — Ambulatory Visit (INDEPENDENT_AMBULATORY_CARE_PROVIDER_SITE_OTHER)
Admission: RE | Admit: 2013-09-18 | Discharge: 2013-09-18 | Disposition: A | Discharge status: Home / Self Care | Payer: Medicare PPO | Source: Ambulatory Visit | Attending: Internal Medicine | Admitting: Internal Medicine

## 2013-09-18 ENCOUNTER — Ambulatory Visit (INDEPENDENT_AMBULATORY_CARE_PROVIDER_SITE_OTHER): Payer: Medicare PPO | Admitting: Internal Medicine

## 2013-09-18 ENCOUNTER — Encounter: Payer: Self-pay | Admitting: Internal Medicine

## 2013-09-18 VITALS — BP 106/74 | HR 60 | Temp 97.8°F | Ht 69.0 in | Wt 137.0 lb

## 2013-09-18 DIAGNOSIS — R0609 Other forms of dyspnea: Secondary | ICD-10-CM

## 2013-09-18 DIAGNOSIS — R0989 Other specified symptoms and signs involving the circulatory and respiratory systems: Secondary | ICD-10-CM

## 2013-09-18 DIAGNOSIS — R0600 Dyspnea, unspecified: Secondary | ICD-10-CM

## 2013-09-18 DIAGNOSIS — Z23 Encounter for immunization: Secondary | ICD-10-CM

## 2013-09-18 DIAGNOSIS — R918 Other nonspecific abnormal finding of lung field: Secondary | ICD-10-CM

## 2013-09-18 LAB — PULMONARY FUNCTION TEST
DL/VA % pred: 76 %
DL/VA: 3.45 ml/min/mmHg/L
DLCO UNC % PRED: 54 %
DLCO UNC: 16.99 ml/min/mmHg
FEF 25-75 POST: 1.13 L/s
FEF 25-75 PRE: 1.26 L/s
FEF2575-%Change-Post: -9 %
FEF2575-%PRED-POST: 55 %
FEF2575-%Pred-Pre: 61 %
FEV1-%Change-Post: -2 %
FEV1-%Pred-Post: 81 %
FEV1-%Pred-Pre: 83 %
FEV1-PRE: 2.39 L
FEV1-Post: 2.33 L
FEV1FVC-%Change-Post: 0 %
FEV1FVC-%PRED-PRE: 89 %
FEV6-%Change-Post: -2 %
FEV6-%PRED-POST: 94 %
FEV6-%PRED-PRE: 96 %
FEV6-Post: 3.51 L
FEV6-Pre: 3.61 L
FEV6FVC-%CHANGE-POST: 0 %
FEV6FVC-%PRED-PRE: 104 %
FEV6FVC-%Pred-Post: 104 %
FVC-%Change-Post: -2 %
FVC-%PRED-PRE: 92 %
FVC-%Pred-Post: 90 %
FVC-POST: 3.6 L
FVC-Pre: 3.7 L
PRE FEV1/FVC RATIO: 65 %
Post FEV1/FVC ratio: 65 %
Post FEV6/FVC ratio: 97 %
Pre FEV6/FVC Ratio: 98 %
RV % pred: 91 %
RV: 2.32 L
TLC % pred: 84 %
TLC: 5.76 L

## 2013-09-18 NOTE — Patient Instructions (Signed)
Keep appointment to see the ID specialist and see how you do in the meantime off zithromax  Please schedule a follow up visit in 3 months but call sooner if needed

## 2013-09-18 NOTE — Assessment & Plan Note (Addendum)
-  See CT chest 07/01/13  - ESR 07/13/2013 = 14 - FOB 08/11/2013 > copious mp secretions bilaterally > WBC 4410 with 80% polys / 2% eos> 2+ AFB/ small cluster atypical cells/never smoker> non MTB/Non MAI reported 08/19/2013 > sent for further speciation by lab plus aspergillus species identified>  M Abscessus > referred to ID 09/09/2013  - Levaquin rx 8/4-14/2015 - ETH/ ZMAX 08/19/2013 >>> 08/20/2013  cancelled eth due to concerns by Dr Katy Fitch    zmax stoppedtoday  (pm fatigue) and rec hold further abx until sees ID

## 2013-09-18 NOTE — Assessment & Plan Note (Signed)
-   PFTs 09/18/2013   FEV 1  2.39(83%) ratio 65 and no change p Saba,  dlco 54% corrects to 76% - 09/18/2013  Walked RA @ nl pace  x 3 laps @ 185 ft each stopped due to  End of study/ no desat   He does have mild airflow obst and clearly has some mucociliary dysfunction but not enough to justify bronchodilators at this point

## 2013-09-18 NOTE — Progress Notes (Signed)
PFT done today. 

## 2013-09-18 NOTE — Progress Notes (Signed)
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 Pulmonary Consult    Referred per Dr. Christene Slates for eval of MPN. Pt states had PNA in June 2015.  He c/o minimal cough- prod with minimal tan sputum.   winter 2014 bad bronchitis 100% better Dec 2014 bad bronchitis rx ? zpak > better but nagging dry cough then started taking allergy meds in May 2015 for fatgue he assoc with gardening  no better>   then abruptly felt ill when tried to get up from prolonged sitting during  first week in June main c/o was weak legs, but also noted   Pain both sides of lower chest and back, and fever 101 > UC rx avelox > 100% better including nagging cough better but fatigue 75% better at most. Appetite back to baseline. rec Recheck 2 weeks, no change rx      07/27/2013 f/u ov/Cobe Viney re: chronic cough/ abn cxr  Chief Complaint  Patient presents with  . Follow-up    Cough is unchanged since last visit.   75% improvement,  Remaining 25% = dry cough day = night, not back jogging yet but Not limited by breathing from desired activities   rec Prevnar13 Aug 4 fob > Pos afb/ Pos atypcial cyt with fever 102 > levaquin x 10d    08/19/2013 f/u ov/Lyllie Cobbins re: Vivi Barrack  Chief Complaint  Patient presents with  . Follow-up    F/u after bronch. Pt c/o lack of energy. Pt states he has had a fever above 99 degrees since bronch on 8/4 and c/o sweats. Pt states he was afebrile today. Pt c/o prod cough with tan mucous. Denies CP.   finishing up levaquin, mucus much less vol since fob, bloody sputum clearing / no cp and no more fever  rec Zithromax 250 mg one daily  Ethambutol 400 mg twice daily > canceled by ophthalmology    mucinex or mucinex dm up 1200 mg every 12 hours as needed and use your flutter valve      09/18/2013 f/u ov/Ivah Girardot re: bronchiectasis /M abscessus end of zmax trial.  Chief Complaint  Patient presents with  . Follow-up    CXR and PFT done today. No new co's today.   no change mucus = tan more in ams/ no blood/ using flutter / singing ok now Not limited by breathing from desired activities but very sedentary  No longer having sweats while  on zmax but fatigue in pms worse   No obvious day to day or daytime variabilty or assoc limiting sob or cp or chest tightness, subjective wheeze overt sinus or hb symptoms. No unusual exp hx or h/o childhood pna/ asthma or knowledge of premature birth.  Sleeping ok without nocturnal  or early am exacerbation  of respiratory  c/o's or need for noct saba. Also denies any obvious fluctuation of symptoms with weather or environmental changes or other aggravating or alleviating factors except as outlined above   Current Medications, Allergies, Complete Past Medical History, Past Surgical History, Family History, and Social History were reviewed in [facility] record.  ROS  The following are not  active complaints unless bolded sore throat, dysphagia, dental problems, itching, sneezing,  nasal congestion or excess/ purulent secretions, ear ache,   fever, chills, sweats, unintended wt loss, pleuritic or exertional cp, hemoptysis,  orthopnea pnd or leg swelling, presyncope, palpitations, heartburn, abdominal pain, anorexia, nausea, vomiting, diarrhea  or change in bowel or urinary habits, change in stools or urine, dysuria,hematuria,  rash, arthralgias, visual complaints, headache, numbness weakness or ataxia or problems with walking or coordination,  change in mood/affect or memory.                      Objective:   Physical Exam   Somber thin amb wm nad  09/18/2013        137  Wt Readings from Last 3 Encounters:  08/19/13 138 lb  (62.596 kg)  07/27/13 142 lb (64.411 kg)  07/13/13 142 lb (64.411 kg)        HEENT: nl dentition, turbinates, and orophanx. Nl external ear canals without cough reflex   NECK :  without JVD/Nodes/TM/ nl carotid upstrokes bilaterally   LUNGS: no acc muscle use, unusual pectus deformity bilaterally abut clear to A and P bilaterally without cough on insp or exp maneuvers   CV:  RRR  no s3 or murmur or increase in P2, no edema   ABD:  soft and nontender with nl excursion in the supine position. No bruits or organomegaly, bowel sounds nl  MS:  warm without deformities, calf tenderness, cyanosis or clubbing       CXR  09/18/2013 :  Patchy infiltrate persists in the left base. Areas of mild atelectatic change in the right mid lower lung zones on the right are present. No change in cardiac silhouette. Underlying emphysema      Assessment & Plan:

## 2013-09-19 ENCOUNTER — Encounter: Payer: Self-pay | Admitting: Internal Medicine

## 2013-09-28 ENCOUNTER — Ambulatory Visit (INDEPENDENT_AMBULATORY_CARE_PROVIDER_SITE_OTHER): Payer: Medicare PPO | Admitting: Internal Medicine

## 2013-09-28 ENCOUNTER — Encounter: Payer: Self-pay | Admitting: Internal Medicine

## 2013-09-28 VITALS — BP 149/74 | HR 58 | Temp 97.8°F | Wt 139.0 lb

## 2013-09-28 DIAGNOSIS — A319 Mycobacterial infection, unspecified: Secondary | ICD-10-CM

## 2013-09-28 LAB — CBC WITH DIFFERENTIAL/PLATELET
BASOS ABS: 0.1 10*3/uL (ref 0.0–0.1)
BASOS PCT: 1 % (ref 0–1)
EOS ABS: 0.2 10*3/uL (ref 0.0–0.7)
EOS PCT: 4 % (ref 0–5)
HCT: 42.4 % (ref 39.0–52.0)
Hemoglobin: 13.9 g/dL (ref 13.0–17.0)
Lymphocytes Relative: 23 % (ref 12–46)
Lymphs Abs: 1.3 10*3/uL (ref 0.7–4.0)
MCH: 28.4 pg (ref 26.0–34.0)
MCHC: 32.8 g/dL (ref 30.0–36.0)
MCV: 86.5 fL (ref 78.0–100.0)
Monocytes Absolute: 0.6 10*3/uL (ref 0.1–1.0)
Monocytes Relative: 10 % (ref 3–12)
Neutro Abs: 3.6 10*3/uL (ref 1.7–7.7)
Neutrophils Relative %: 62 % (ref 43–77)
PLATELETS: 146 10*3/uL — AB (ref 150–400)
RBC: 4.9 MIL/uL (ref 4.22–5.81)
RDW: 14.3 % (ref 11.5–15.5)
WBC: 5.8 10*3/uL (ref 4.0–10.5)

## 2013-09-28 NOTE — Progress Notes (Signed)
   Subjective:    Patient ID: Danny Gross, male    DOB: 04/23/1934, 79 y.o.   MRN: [id number]  HPI  Pneumonia treated in [place], [place] urgent [facility] center given levofloxacin. Improved. For productive cough, fatigue and weakness. Retired Environmental manager professor 2009.  Seen by dr. Sherene Sires recently, found to have pulmonary nodule, bronchoscopy + M.Abscessus. PFTs.  Had been on azithromycin daily x 30 day, stopped on 9/11. Initially to have azithro/emb but unable e to take emb due to gout. In last 2 wks feeling better. No longer having fatigue.    SE azithro: lack of energy. Sleep 10hr plus 1 hr nap. No Known Allergies Current Outpatient Prescriptions on File Prior to Visit  Medication Sig Dispense Refill  . brimonidine-timolol (COMBIGAN) 0.2-0.5 % ophthalmic solution Place 1 drop into both eyes every 12 (twelve) hours.      . Cholecalciferol (VITAMIN D PO) Once every 1 to 2 wks      . dorzolamide (TRUSOPT) 2 % ophthalmic solution Place 1 drop into both eyes 2 (two) times daily.      Marland Kitchen guaiFENesin (MUCINEX) 600 MG 12 hr tablet Take 600 mg by mouth 2 (two) times daily as needed.      . latanoprost (XALATAN) 0.005 % ophthalmic solution Place 1 drop into both eyes at bedtime.      Marland Kitchen Respiratory Therapy Supplies (FLUTTER) DEVI As directed.  1 each  0   No current facility-administered medications on file prior to visit.   Active Ambulatory Problems    Diagnosis Date Noted  . Pulmonary infiltrates 07/13/2013  . Dyspnea 07/27/2013   Resolved Ambulatory Problems    Diagnosis Date Noted  . No Resolved Ambulatory Problems   No Additional Past Medical History     Soc hx: walking - a day ([place]) and swimming (indoor) pool 5-54min/day. Sings 2 hr/ day. No smoking drinking . Enjoys gardening. [place].   family history includes Cancer in his maternal aunt.  Review of Systems Feels back to normal except for fatigue, requiring afternoon nap . #5 lost of weight.    Objective:   Physical  Exam BP 149/74  Pulse 58  Temp(Src) 97.8 F (36.6 C) (Oral)  Wt 139 lb (63.05 kg) Physical Exam  Constitutional: He is oriented to person, place, and time. He appears well-developed and well-nourished. No distress.  HENT:  Mouth/Throat: Oropharynx is clear and moist. No oropharyngeal exudate.  Cardiovascular: Normal rate, regular rhythm and normal heart sounds. Exam reveals no gallop and no friction rub.  No murmur heard.  Pulmonary/Chest: Effort normal and breath sounds normal. No respiratory distress. He has no wheezes.  Abdominal: Soft. Bowel sounds are normal. He exhibits no distension. There is no tenderness.  Lymphadenopathy:  He has no cervical adenopathy.  Neurological: He is alert and oriented to person, place, and time.  Skin: Skin is warm and dry. No rash noted. No erythema.  Psychiatric: He has a normal mood and affect. His behavior is normal.      Assessment & Plan:  Pulmonary m.abscessus infection = will coordinate with [facility] to send isolate out for susceptibility testing. After review of information, can decide of dual therapy to treat NTM pulmonary infection  Health maintenance = patient already received flu vaccination  rtc 4 wk

## 2013-09-29 LAB — COMPLETE METABOLIC PANEL WITH GFR
ALK PHOS: 93 U/L (ref 39–117)
ALT: 16 U/L (ref 0–53)
AST: 20 U/L (ref 0–37)
Albumin: 4.1 g/dL (ref 3.5–5.2)
BILIRUBIN TOTAL: 0.8 mg/dL (ref 0.2–1.2)
BUN: 28 mg/dL — ABNORMAL HIGH (ref 6–23)
CO2: 30 mEq/L (ref 19–32)
CREATININE: 1.34 mg/dL (ref 0.50–1.35)
Calcium: 9.8 mg/dL (ref 8.4–10.5)
Chloride: 98 mEq/L (ref 96–112)
GFR, Est African American: 59 mL/min — ABNORMAL LOW
GFR, Est Non African American: 51 mL/min — ABNORMAL LOW
Glucose, Bld: 82 mg/dL (ref 70–99)
Potassium: 4.7 mEq/L (ref 3.5–5.3)
SODIUM: 137 meq/L (ref 135–145)
TOTAL PROTEIN: 7 g/dL (ref 6.0–8.3)

## 2013-10-15 ENCOUNTER — Other Ambulatory Visit: Payer: Self-pay | Admitting: Dermatology

## 2013-10-19 ENCOUNTER — Encounter: Payer: Self-pay | Admitting: Internal Medicine

## 2013-10-23 ENCOUNTER — Other Ambulatory Visit: Payer: Self-pay

## 2013-10-30 ENCOUNTER — Encounter: Payer: Self-pay | Admitting: Internal Medicine

## 2013-11-05 ENCOUNTER — Ambulatory Visit (INDEPENDENT_AMBULATORY_CARE_PROVIDER_SITE_OTHER): Payer: Medicare PPO | Admitting: Internal Medicine

## 2013-11-05 ENCOUNTER — Encounter: Payer: Self-pay | Admitting: Internal Medicine

## 2013-11-05 VITALS — BP 120/82 | HR 65 | Temp 98.1°F | Wt 141.0 lb

## 2013-11-05 DIAGNOSIS — R05 Cough: Secondary | ICD-10-CM

## 2013-11-05 DIAGNOSIS — R058 Other specified cough: Secondary | ICD-10-CM

## 2013-11-05 DIAGNOSIS — R918 Other nonspecific abnormal finding of lung field: Secondary | ICD-10-CM

## 2013-11-05 DIAGNOSIS — A319 Mycobacterial infection, unspecified: Secondary | ICD-10-CM

## 2013-11-05 LAB — REFERRED ASSAY

## 2013-11-09 NOTE — Progress Notes (Signed)
Subjective:    Patient ID: Danny Gross, male    DOB: August 30, 1934, 79 y.o.   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 He was initially treated for  [place] given levofloxacin. Improved. He as then referred to see dr. Sherene SiresWert  In early July when they found pulmonary nodule on CT. He underwent bronchoscopy + M.Abscessus. PFTs. He did an emperic trial of azithromycin daily x 30 day, stopped on 9/11. Initially to have azithro/emb but unable to take emb due to gout. We saw him for the first time in late September awaiting the sensitivities of the m.abscessus.  Amikacin 8  S Cefoxitin 32  I cipro 4  R clarithro PENDING Doxy > 16  R linezolid 16  I tigecycline 1 Bactrim   R  He states that he is feeling incredibly well, better than he has in a while, back to his normal exercise routine which includes swimming and going to the gym. He states that he still has mild productive cough but not associated with fatigued or DOE.  He and his wife have plans to go to [place] for thanksgiving and then to [place] over the winter holidays.  They have numerous questions regarding further treatment  Review of Systems Mild productive cough, otherwise 10 point ros is negative    Objective:   Physical Exam BP 120/82 mmHg  Pulse 65  Temp(Src) 98.1 F (36.7 C) (Oral)  Wt 141 lb (63.957 kg) Physical Exam  Constitutional: He is oriented to person, place, and time. He appears well-developed and well-nourished. No distress.  HENT:  Mouth/Throat: Oropharynx is clear and moist. No oropharyngeal exudate.     CT 6/24/015 FINDINGS: On lung window images there is parenchymal opacity within the left lower lobe, some of which represents multiple adjacent nodules. One of the larger nodules in the left lower lobe on  image number 60 measures 3.0 x 1.3 cm with a nodule more anteriorly in the left lower lobe on image number 56 measuring 1.1 cm in diameter. A nodular component within the left lower lobe posterior medially on image number 51 measures 2.5 x 2.9 cm. These findings could all represent pneumonia. However, there are multiple additional lung nodules throughout the lungs with an irregular soft tissue opacity anteriorly within the inferior medial right upper lobe extending to the pericardial contours. This lesion measures 1.8 x 3.0 cm on image number 40. Although these findings could all relate to an inflammatory or infectious process, a malignancy is a definite consideration. If these areas do not clear appropriately with therapy, bronchoscopy may be helpful to assess for cytology. If bronchoscopy is not successful, percutaneous biopsy could be performed. No pleural effusion is seen.  On soft tissue window images, this study is somewhat limited due to no IV contrast media being given. However the only nodes which are identified are within the precarinal region measuring 8 and 7 mm in short axis diameter respectively. No definite mediastinal or hilar adenopathy is seen. The heart is mildly enlarged. The portion of the upper abdomen that is visualized is unremarkable with a simple appearing cyst emanating from the upper pole of the left kidney. Only mild degenerative change is present in the mid to lower thoracic spine. No lytic or blastic bony lesion is seen.     Assessment &  Plan:  M.absessus pulmonary nodular disease = spent 45 min with 100% of the time in counseling with management of disease process, side effects of medications, length of therapy. What is anticipated with using Iv antibiotics. Patient and his wife are interested in deferring treatment until Jan 2016 since they do not want to be using Iv antibiotics [place] [address] [place]  We will see him back in 1 month to  see how he is feeling. If worsening, would discuss to start treatment sooner than later.  clarithro PENDING but based on his 1 month empiric trial of azithro, it is likely to be sensitive. Few oral options. clarithro , possibly linezolid. For IV, will need to consider using amikacin vs.cefoxitin vs. Tigecycline.  rtc in 4 wk

## 2013-12-15 ENCOUNTER — Ambulatory Visit (INDEPENDENT_AMBULATORY_CARE_PROVIDER_SITE_OTHER): Payer: Medicare PPO | Admitting: Internal Medicine

## 2013-12-15 ENCOUNTER — Encounter: Payer: Self-pay | Admitting: Internal Medicine

## 2013-12-15 VITALS — BP 140/83 | HR 53 | Temp 98.1°F | Wt 145.0 lb

## 2013-12-15 DIAGNOSIS — R918 Other nonspecific abnormal finding of lung field: Secondary | ICD-10-CM

## 2013-12-15 NOTE — Progress Notes (Signed)
Subjective:    Patient ID: Danny RutterGerald W Hund, male    DOB: August 09, 1934, 79 y.o.   MRN: [id number]  HPI 79yo M with m.abscessus pulmonary infection. CT chest in June showed numerous nodules. M.abscessus isolate fairly resistant. Currently he is asymptomatic, no worse than baseline. He has still ongoing productive cough, but frequency consistency unchanged in the last year. He denies shortness of breath, still runs without difficulty. He is quite active with his wife, they go out dancing. 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 He notices that he needs 9 hr of sleep so that he is not fatigued. Fatigue slightly more in the last 2 wk, more so than his last visit.   We discussed treatment options and side effects. He reports being sensitive to medications and is concerned how treatment will affect his quality of life  M.abscessus sensitivity:  Amikacin 8 S Cefoxitin 32 I cipro 4 R clarithro          R Doxy > 16 R linezolid 16 I tigecycline 1 Bactrim R  Current Outpatient Prescriptions on File Prior to Visit  Medication Sig Dispense Refill  . brimonidine-timolol (COMBIGAN) 0.2-0.5 % ophthalmic solution Place 1 drop into both eyes every 12 (twelve) hours.    . dorzolamide (TRUSOPT) 2 % ophthalmic solution Place 1 drop into both eyes 2 (two) times daily.    Marland Kitchen. latanoprost (XALATAN) 0.005 % ophthalmic solution Place 1 drop into both eyes at bedtime.    . Cholecalciferol (VITAMIN D PO) Once every 1 to 2 wks    . guaiFENesin (MUCINEX) 600 MG 12 hr tablet Take 600 mg by mouth 2 (two) times daily as needed.    Marland Kitchen. Respiratory Therapy Supplies (FLUTTER) DEVI As directed. (Patient not taking: Reported on 12/15/2013) 1 each 0   No current facility-administered medications on file prior to visit.   Active Ambulatory Problems    Diagnosis Date Noted  . Pulmonary infiltrates 07/13/2013  . Dyspnea 07/27/2013   Resolved Ambulatory Problems   Diagnosis Date Noted  . No Resolved Ambulatory Problems   No Additional Past Medical History     Review of Systems + productive cough + fatigue, otherwise other 10 point ros is negative    Objective:   Physical Exam BP 140/83 mmHg  Pulse 53  Temp(Src) 98.1 F (36.7 C) (Oral)  Wt 145 lb (65.772 kg) Physical Exam  Constitutional: He is oriented to person, place, and time. He appears well-developed and well-nourished. No distress.  HENT:  Mouth/Throat: Oropharynx is clear and moist. No oropharyngeal exudate.  Cardiovascular: Normal rate, regular rhythm and normal heart sounds. Exam reveals no gallop and no friction rub.  No murmur heard.  Pulmonary/Chest: Effort normal and breath sounds normal. No respiratory distress. He has no wheezes.  Abdominal: Soft. Bowel sounds are normal. He exhibits no distension. There is no tenderness.  Lymphadenopathy:  He has no cervical adenopathy.  Neurological: He is alert and oriented to person, place, and time.  Skin: Skin is warm and dry. No rash noted. No erythema.  Psychiatric: He has a normal mood and affect. His behavior is normal.        Assessment & Plan:  M.abscessus pulmonary infection = on CT in Summer 2015 showed nodules in setting of being fairly symptomatic. He was given a course of macrolide (however his isolate is R to them) with improvement in his symptoms. presently No urgency to treat M.abscessus. Since he has busy holiday schedule. We will  plan to get chest ct scan for January to decide when to treat.   If we decide to use AG, will need baseline hearing test. May also pre-treat with NAC to minimize AKI and hearing loss SE. Would only do M-W-F.   For now will watch if symptoms worsen over the next 4-6 wks which would prompt treatment sooner than later.  rtc in 6 wk

## 2013-12-17 ENCOUNTER — Ambulatory Visit (INDEPENDENT_AMBULATORY_CARE_PROVIDER_SITE_OTHER): Payer: Medicare PPO | Admitting: Internal Medicine

## 2013-12-17 ENCOUNTER — Encounter: Payer: Self-pay | Admitting: Internal Medicine

## 2013-12-17 VITALS — BP 140/82 | HR 54 | Temp 97.6°F | Ht 69.0 in | Wt 146.0 lb

## 2013-12-17 DIAGNOSIS — R918 Other nonspecific abnormal finding of lung field: Secondary | ICD-10-CM

## 2013-12-17 NOTE — Patient Instructions (Signed)
Pulmonary follow up will be as needed until / unless Dr Ilsa IhaSnyder or you feel I can be of service.

## 2013-12-17 NOTE — Progress Notes (Signed)
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 Pulmonary Consult    Referred per Dr. Christene SlatesKarar Hussain for eval of MPN. Pt states had PNA in June 2015.  He c/o minimal cough- prod with minimal tan sputum.   winter 2014 bad bronchitis 100% better Dec 2014 bad bronchitis rx ? zpak > better but nagging dry cough then started taking allergy meds in May 2015 for fatgue he assoc with gardening  no better>   then abruptly felt ill when tried to get up from prolonged sitting during  first week in June main c/o was weak legs, but also noted   Pain both sides of lower chest and back, and fever 101 > UC rx avelox > 100% better including nagging cough better but fatigue 75% better at most. Appetite back to baseline. rec Recheck 2 weeks, no change rx      07/27/2013 f/u ov/Brigit Doke re: chronic cough/ abn cxr  Chief Complaint  Patient presents with  . Follow-up    Cough is unchanged since last visit.   75% improvement,  Remaining 25% = dry cough day = night, not back jogging yet but Not limited by breathing from desired activities   rec Prevnar13 Aug 4 fob > Pos afb/ Pos atypcial cyt with fever 102 > levaquin x 10d    08/19/2013 f/u ov/Loura Pitt re: prob atypical TB   Chief Complaint  Patient presents with  . Follow-up    F/u after bronch. Pt c/o lack of energy. Pt states he has had a fever above 99 degrees since bronch on 8/4 and c/o sweats. Pt states he was afebrile today. Pt c/o prod cough with tan mucous. Denies CP.   finishing up levaquin, mucus much less vol since fob, bloody sputum clearing / no cp and no more fever  rec Zithromax 250 mg one daily    Ethambutol 400 mg twice daily > canceled by ophthalmology  mucinex or mucinex dm up 1200 mg every 12 hours as needed and use your flutter valve      09/18/2013 f/u ov/Bernetta Sutley re: bronchiectasis /M abscessus end of zmax trial.  Chief Complaint  Patient presents with  . Follow-up    CXR and PFT done today. No new co's today.   no change mucus = tan more in ams/ no blood/ using flutter / singing ok now Not limited by breathing from desired activities but very sedentary  No longer having sweats while  on zmax but fatigue in pms worse rec Keep appointment to see the ID specialist and see how you do in the meantime off zithromax     12/17/2013 f/u ov/Iolani Twilley re: atypical TB / f/u by Drue SecondSnider, not on rx  Chief Complaint  Patient presents with  . Follow-up    Pt states that his breathing is doing well and he denies any new co's today.   cough continues daily variable pattern with tan never bloody and more with singing   No obvious day to day or daytime variabilty or assoc limiting sob including running  or cp or chest tightness, subjective  wheeze overt sinus or hb symptoms. No unusual exp hx or h/o childhood pna/ asthma or knowledge of premature birth.  Sleeping ok without nocturnal  or early am exacerbation  of respiratory  c/o's or need for noct saba. Also denies any obvious fluctuation of symptoms with weather or environmental changes or other aggravating or alleviating factors except as outlined above   Current Medications, Allergies, Complete Past Medical History, Past Surgical History, Family History, and Social History were reviewed in [facility] electronic medical record.  ROS  The following are not active complaints unless bolded sore throat, dysphagia, dental problems, itching, sneezing,  nasal congestion or excess/ purulent secretions, ear ache,   fever, chills, sweats, unintended wt loss, pleuritic or exertional cp, hemoptysis,  orthopnea pnd or leg swelling, presyncope,  palpitations, heartburn, abdominal pain, anorexia, nausea, vomiting, diarrhea  or change in bowel or urinary habits, change in stools or urine, dysuria,hematuria,  rash, arthralgias, visual complaints, headache, numbness weakness or ataxia or problems with walking or coordination,  change in mood/affect or memory.                      Objective:   Physical Exam   Somber thin amb wm nad  09/18/2013        137 > 12/17/2013 146  Wt Readings from Last 3 Encounters:  08/19/13 138 lb (62.596 kg)  07/27/13 142 lb (64.411 kg)  07/13/13 142 lb (64.411 kg)        HEENT: nl dentition, turbinates, and orophanx. Nl external ear canals without cough reflex   NECK :  without JVD/Nodes/TM/ nl carotid upstrokes bilaterally   LUNGS: no acc muscle use, unusual pectus deformity bilaterally abut clear to A and P bilaterally without cough on insp or exp maneuvers   CV:  RRR  no s3 or murmur or increase in P2, no edema   ABD:  soft and nontender with nl excursion in the supine position. No bruits or organomegaly, bowel sounds nl  MS:  warm without deformities, calf tenderness, cyanosis or clubbing       CXR  09/18/2013 : Patchy infiltrate persists in the left base. Areas of mild atelectatic change in the right mid lower lung zones on the right are present. No change in cardiac silhouette. Underlying emphysema      Assessment & Plan:

## 2013-12-20 NOTE — Assessment & Plan Note (Signed)
-  See CT chest 07/01/13  - ESR 07/13/2013 = 14 - FOB 08/11/2013 > copious mp secretions bilaterally > WBC 4410 with 80% polys / 2% eos> 2+ AFB/ small cluster atypical cells/never smoker> non MTB/Non MAI reported 08/19/2013 > sent for further speciation by lab plus aspergillus species identified>  M Abscessus > referred to ID 09/09/2013  - Levaquin rx 8/4-14/2015 - ETH/ ZMAX 08/19/2013 >>> 08/20/2013  cancelled eth due to concerns by Dr Groat>  zmax stopped 09/18/2013 (pm fatigue)  He is gaining wt and has minimal symptoms  I had an extended discussion with the patient reviewing all relevant studies completed to date and  lasting 15 to 20 minutes of a 25 minute visit on the following ongoing concerns:   1) w/u from initiation reviewed with results to date 2) no evidence of any sign lung problem x for atypica tb involvement LLL and not a surgical candidate (poor outcomes when try to address this surgicall so would be a last resort)   Pulmonary can be f/u prn or if needed by ID  

## 2013-12-21 ENCOUNTER — Other Ambulatory Visit (INDEPENDENT_AMBULATORY_CARE_PROVIDER_SITE_OTHER): Payer: Medicare PPO

## 2013-12-21 ENCOUNTER — Encounter: Payer: Self-pay | Admitting: Internal Medicine

## 2013-12-21 ENCOUNTER — Ambulatory Visit (INDEPENDENT_AMBULATORY_CARE_PROVIDER_SITE_OTHER): Payer: Medicare PPO | Admitting: Internal Medicine

## 2013-12-21 VITALS — BP 132/80 | HR 58 | Temp 97.7°F | Resp 18 | Ht 68.0 in | Wt 144.2 lb

## 2013-12-21 DIAGNOSIS — Z0000 Encounter for general adult medical examination without abnormal findings: Secondary | ICD-10-CM

## 2013-12-21 DIAGNOSIS — Z79899 Other long term (current) drug therapy: Secondary | ICD-10-CM

## 2013-12-21 DIAGNOSIS — R918 Other nonspecific abnormal finding of lung field: Secondary | ICD-10-CM

## 2013-12-21 LAB — CBC
HCT: 43.5 % (ref 39.0–52.0)
HEMOGLOBIN: 14.1 g/dL (ref 13.0–17.0)
MCHC: 32.5 g/dL (ref 30.0–36.0)
MCV: 88.7 fl (ref 78.0–100.0)
Platelets: 123 10*3/uL — ABNORMAL LOW (ref 150.0–400.0)
RBC: 4.9 Mil/uL (ref 4.22–5.81)
RDW: 14.1 % (ref 11.5–15.5)
WBC: 6.5 10*3/uL (ref 4.0–10.5)

## 2013-12-21 LAB — LIPID PANEL
CHOLESTEROL: 204 mg/dL — AB (ref 0–200)
HDL: 53.5 mg/dL (ref >39.00)
LDL Cholesterol: 135 mg/dL — ABNORMAL HIGH (ref 0–99)
NONHDL: 150.5
Total CHOL/HDL Ratio: 4
Triglycerides: 79 mg/dL (ref 0.0–149.0)
VLDL: 15.8 mg/dL (ref 0.0–40.0)

## 2013-12-21 LAB — HEMOGLOBIN A1C: HEMOGLOBIN A1C: 6.2 % (ref 4.6–6.5)

## 2013-12-21 NOTE — Assessment & Plan Note (Signed)
He does have M abscesses in his lungs. Currently following with infectious disease and will have repeat CT scan lungs in January. Depending on progression versus regression of the disease he may require treatment. Given his current lack of symptoms and great joy with music would be hesitant to treat aggressively with amikacin.

## 2013-12-21 NOTE — Progress Notes (Signed)
Pre visit review using our clinic review tool, if applicable. No additional management support is needed unless otherwise documented below in the visit note. 

## 2013-12-21 NOTE — Patient Instructions (Signed)
We will check your blood work today. We will call you back with your results.  We will see you back next year for your physical. If you have any new problems or questions before then please feel free to call our office.  Health Maintenance A healthy lifestyle and preventative care can promote health and wellness.  Maintain regular health, dental, and eye exams.  Eat a healthy diet. Foods like vegetables, fruits, whole grains, low-fat dairy products, and lean protein foods contain the nutrients you need and are low in calories. Decrease your intake of foods high in solid fats, added sugars, and salt. Get information about a proper diet from your health care provider, if necessary.  Regular physical exercise is one of the most important things you can do for your health. Most adults should get at least 150 minutes of moderate-intensity exercise (any activity that increases your heart rate and causes you to sweat) each week. In addition, most adults need muscle-strengthening exercises on 2 or more days a week.   Maintain a healthy weight. The body mass index (BMI) is a screening tool to identify possible weight problems. It provides an estimate of body fat based on height and weight. Your health care provider can find your BMI and can help you achieve or maintain a healthy weight. For males 20 years and older:  A BMI below 18.5 is considered underweight.  A BMI of 18.5 to 24.9 is normal.  A BMI of 25 to 29.9 is considered overweight.  A BMI of 30 and above is considered obese.  Maintain normal blood lipids and cholesterol by exercising and minimizing your intake of saturated fat. Eat a balanced diet with plenty of fruits and vegetables. Blood tests for lipids and cholesterol should begin at age 77 and be repeated every 5 years. If your lipid or cholesterol levels are high, you are over age 950, or you are at high risk for heart disease, you may need your cholesterol levels checked more  frequently.Ongoing high lipid and cholesterol levels should be treated with medicines if diet and exercise are not working.  If you smoke, find out from your health care provider how to quit. If you do not use tobacco, do not start.  Lung cancer screening is recommended for adults aged 55-80 years who are at high risk for developing lung cancer because of a history of smoking. A yearly low-dose CT scan of the lungs is recommended for people who have at least a 30-pack-year history of smoking and are current smokers or have quit within the past 15 years. A pack year of smoking is smoking an average of 1 pack of cigarettes a day for 1 year (for example, a 30-pack-year history of smoking could mean smoking 1 pack a day for 30 years or 2 packs a day for 15 years). Yearly screening should continue until the smoker has stopped smoking for at least 15 years. Yearly screening should be stopped for people who develop a health problem that would prevent them from having lung cancer treatment.  If you choose to drink alcohol, do not have more than 2 drinks per day. One drink is considered to be 12 oz (360 mL) of beer, 5 oz (150 mL) of wine, or 1.5 oz (45 mL) of liquor.  Avoid the use of street drugs. Do not share needles with anyone. Ask for help if you need support or instructions about stopping the use of drugs.  High blood pressure causes heart disease and increases  the risk of stroke. Blood pressure should be checked at least every 1-2 years. Ongoing high blood pressure should be treated with medicines if weight loss and exercise are not effective.  If you are 46-60 years old, ask your health care provider if you should take aspirin to prevent heart disease.  Diabetes screening involves taking a blood sample to check your fasting blood sugar level. This should be done once every 3 years after age 105 if you are at a normal weight and without risk factors for diabetes. Testing should be considered at a younger  age or be carried out more frequently if you are overweight and have at least 1 risk factor for diabetes.  Colorectal cancer can be detected and often prevented. Most routine colorectal cancer screening begins at the age of 53 and continues through age 42. However, your health care provider may recommend screening at an earlier age if you have risk factors for colon cancer. On a yearly basis, your health care provider may provide home test kits to check for hidden blood in the stool. A small camera at the end of a tube may be used to directly examine the colon (sigmoidoscopy or colonoscopy) to detect the earliest forms of colorectal cancer. Talk to your health care provider about this at age 61 when routine screening begins. A direct exam of the colon should be repeated every 5-10 years through age 61, unless early forms of precancerous polyps or small growths are found.  People who are at an increased risk for hepatitis B should be screened for this virus. You are considered at high risk for hepatitis B if:  You were born in a country where hepatitis B occurs often. Talk with your health care provider about which countries are considered high risk.  Your parents were born in a high-risk country and you have not received a shot to protect against hepatitis B (hepatitis B vaccine).  You have HIV or AIDS.  You use needles to inject street drugs.  You live with, or have sex with, someone who has hepatitis B.  You are a man who has sex with other men (MSM).  You get hemodialysis treatment.  You take certain medicines for conditions like cancer, organ transplantation, and autoimmune conditions.  Hepatitis C blood testing is recommended for all people born from [birth year] through 1963 and any individual with known risk factors for hepatitis C.  Healthy men should no longer receive prostate-specific antigen (PSA) blood tests as part of routine cancer screening. Talk to your health care provider about  prostate cancer screening.  Testicular cancer screening is not recommended for adolescents or adult males who have no symptoms. Screening includes self-exam, a health care provider exam, and other screening tests. Consult with your health care provider about any symptoms you have or any concerns you have about testicular cancer.  Practice safe sex. Use condoms and avoid high-risk sexual practices to reduce the spread of sexually transmitted infections (STIs).  You should be screened for STIs, including gonorrhea and chlamydia if:  You are sexually active and are younger than 24 years.  You are older than 24 years, and your health care provider tells you that you are at risk for this type of infection.  Your sexual activity has changed since you were last screened, and you are at an increased risk for chlamydia or gonorrhea. Ask your health care provider if you are at risk.  If you are at risk of being infected with HIV, it  is recommended that you take a prescription medicine daily to prevent HIV infection. This is called pre-exposure prophylaxis (PrEP). You are considered at risk if:  You are a man who has sex with other men (MSM).  You are a heterosexual man who is sexually active with multiple partners.  You take drugs by injection.  You are sexually active with a partner who has HIV.  Talk with your health care provider about whether you are at high risk of being infected with HIV. If you choose to begin PrEP, you should first be tested for HIV. You should then be tested every 3 months for as long as you are taking PrEP.  Use sunscreen. Apply sunscreen liberally and repeatedly throughout the day. You should seek shade when your shadow is shorter than you. Protect yourself by wearing long sleeves, pants, a wide-brimmed hat, and sunglasses year round whenever you are outdoors.  Tell your health care provider of new moles or changes in moles, especially if there is a change in shape or  color. Also, tell your health care provider if a mole is larger than the size of a pencil eraser.  A one-time screening for abdominal aortic aneurysm (AAA) and surgical repair of large AAAs by ultrasound is recommended for men aged 53-75 years who are current or former smokers.  Stay current with your vaccines (immunizations). Document Released: 06/23/2007 Document Revised: 12/30/2012 Document Reviewed: 05/22/2010 [facility] Patient Information 2015 [place], [place]. This information is not intended to replace advice given to you by your health care provider. Make sure you discuss any questions you have with your health care provider.

## 2013-12-21 NOTE — Assessment & Plan Note (Signed)
Patient reports last colonoscopy 2006. We'll try to obtain records. He is up-to-date on immunizations. Check lipid panel, hemoglobin A1c, BMP. He does exercise well and has reasonably good diet.

## 2013-12-21 NOTE — Progress Notes (Signed)
   Subjective:    Patient ID: Danny Gross, male    DOB: 26-Oct-1934, 79 y.o.   MRN: [id number]  HPI The patient is a 79 year old man who comes in today to establish care. He does have past medical history of M abscesses in his lungs. He was recently diagnosed with this fall and is currently not troubled by any symptoms. He is due for repeat CT scan in January and that may direct any therapy. The most recent culture taken with bronchoscopy was not susceptible to many agents. He does very well and is a runner. He denies any problems breathing. He denies any chest pain, abdominal pain, acid reflux, constipation, diarrhea. He does not have any osteoarthritis, balance problems, falls. He does enjoy music quite a lot and therefore has some reservations about treatment that could hurt his hearing.  Review of Systems  Constitutional: Negative for fever, activity change, appetite change, fatigue and unexpected weight change.  HENT: Negative.   Respiratory: Negative for cough, chest tightness, shortness of breath and wheezing.   Cardiovascular: Negative for chest pain, palpitations and leg swelling.  Gastrointestinal: Negative for nausea, abdominal pain, diarrhea, constipation and abdominal distention.  Musculoskeletal: Negative.   Skin: Negative.   Neurological: Negative.   Psychiatric/Behavioral: Negative.       Objective:   Physical Exam  Constitutional: He is oriented to person, place, and time. He appears well-developed and well-nourished.  HENT:  Head: Normocephalic and atraumatic.  Eyes: EOM are normal.  Neck: Normal range of motion.  Cardiovascular: Normal rate and regular rhythm.   Pulmonary/Chest: Effort normal and breath sounds normal.  Abdominal: Soft. Bowel sounds are normal. He exhibits no distension. There is no tenderness. There is no rebound.  Neurological: He is alert and oriented to person, place, and time. Coordination normal.  Skin: Skin is warm and dry.   Filed Vitals:     12/21/13 0925  BP: 132/80  Pulse: 58  Temp: 97.7 F (36.5 C)  TempSrc: Oral  Resp: 18  Height: 5\' 8"  (1.727 m)  Weight: 144 lb 3.2 oz (65.409 kg)  SpO2: 96%      Assessment & Plan:

## 2014-01-27 DIAGNOSIS — Z961 Presence of intraocular lens: Secondary | ICD-10-CM | POA: Diagnosis not present

## 2014-01-27 DIAGNOSIS — H4011X3 Primary open-angle glaucoma, severe stage: Secondary | ICD-10-CM | POA: Diagnosis not present

## 2014-01-28 ENCOUNTER — Encounter: Payer: Self-pay | Admitting: Internal Medicine

## 2014-01-28 ENCOUNTER — Ambulatory Visit (INDEPENDENT_AMBULATORY_CARE_PROVIDER_SITE_OTHER): Payer: Medicare PPO | Admitting: Internal Medicine

## 2014-01-28 VITALS — BP 128/72 | HR 63 | Temp 98.4°F | Wt 144.0 lb

## 2014-01-28 DIAGNOSIS — A319 Mycobacterial infection, unspecified: Secondary | ICD-10-CM

## 2014-01-28 LAB — CBC WITH DIFFERENTIAL/PLATELET
BASOS ABS: 0.1 10*3/uL (ref 0.0–0.1)
Basophils Relative: 1 % (ref 0–1)
EOS ABS: 0.3 10*3/uL (ref 0.0–0.7)
EOS PCT: 5 % (ref 0–5)
HEMATOCRIT: 43.5 % (ref 39.0–52.0)
Hemoglobin: 14.1 g/dL (ref 13.0–17.0)
LYMPHS ABS: 1 10*3/uL (ref 0.7–4.0)
Lymphocytes Relative: 19 % (ref 12–46)
MCH: 29.1 pg (ref 26.0–34.0)
MCHC: 32.4 g/dL (ref 30.0–36.0)
MCV: 89.7 fL (ref 78.0–100.0)
MPV: 12 fL (ref 8.6–12.4)
Monocytes Absolute: 0.5 10*3/uL (ref 0.1–1.0)
Monocytes Relative: 9 % (ref 3–12)
NEUTROS ABS: 3.6 10*3/uL (ref 1.7–7.7)
Neutrophils Relative %: 66 % (ref 43–77)
Platelets: 126 10*3/uL — ABNORMAL LOW (ref 150–400)
RBC: 4.85 MIL/uL (ref 4.22–5.81)
RDW: 13.7 % (ref 11.5–15.5)
WBC: 5.5 10*3/uL (ref 4.0–10.5)

## 2014-01-28 LAB — BASIC METABOLIC PANEL WITH GFR
BUN: 26 mg/dL — ABNORMAL HIGH (ref 6–23)
CO2: 28 meq/L (ref 19–32)
CREATININE: 1.19 mg/dL (ref 0.50–1.35)
Calcium: 9.5 mg/dL (ref 8.4–10.5)
Chloride: 103 mEq/L (ref 96–112)
GFR, Est African American: 68 mL/min
GFR, Est Non African American: 59 mL/min — ABNORMAL LOW
Glucose, Bld: 55 mg/dL — ABNORMAL LOW (ref 70–99)
Potassium: 4.4 mEq/L (ref 3.5–5.3)
Sodium: 142 mEq/L (ref 135–145)

## 2014-01-28 NOTE — Progress Notes (Signed)
Subjective:    Patient ID: Danny Gross, male    DOB: Jun 05, 1934, 80 y.o.   MRN: [id number]  HPI 80yo M who had chronic cough in Spring/Summer of 2015 underwent bronchoscopy which revealed m.abscessus on culture. This visit is a follow on pulmonary infection of  m.abscessus isolated from BAL. Patient states that his overall health is unchanged since we last saw him. He still participates in the same activity, only change he notices is that he usually sleeps 9 hrs a day. He has baseline productive cough in the morning and evening but dry cough in the afternoon. No recent illnesses. Did go to Dr. Lavona Mound for his evaluation of glaucoma, now taking acetazolamide.   Current Outpatient Prescriptions on File Prior to Visit  Medication Sig Dispense Refill  . brimonidine-timolol (COMBIGAN) 0.2-0.5 % ophthalmic solution Place 1 drop into both eyes every 12 (twelve) hours.    . Cholecalciferol (VITAMIN D PO) Once every wk    . dorzolamide (TRUSOPT) 2 % ophthalmic solution Place 1 drop into both eyes 2 (two) times daily.    Marland Kitchen guaiFENesin (MUCINEX) 600 MG 12 hr tablet Take 600 mg by mouth 2 (two) times daily as needed.    . latanoprost (XALATAN) 0.005 % ophthalmic solution Place 1 drop into both eyes at bedtime.    Marland Kitchen Respiratory Therapy Supplies (FLUTTER) DEVI As directed. 1 each 0   No current facility-administered medications on file prior to visit.   Active Ambulatory Problems    Diagnosis Date Noted  . Pulmonary infiltrates 07/13/2013  . Routine general medical examination at a health care facility 12/21/2013   Resolved Ambulatory Problems    Diagnosis Date Noted  . Dyspnea 07/27/2013   Past Medical History  Diagnosis Date  . Cataract   . Emphysema of lung       Review of Systems Review of Systems  Constitutional: Negative for fever, chills, diaphoresis, activity change, appetite change, fatigue and unexpected weight change.  HENT: Negative for congestion, sore throat, rhinorrhea,  sneezing, trouble swallowing and sinus pressure.  Eyes: Negative for photophobia and visual disturbance.  Respiratory: Negative for cough, chest tightness, shortness of breath, wheezing and stridor.  Cardiovascular: Negative for chest pain, palpitations and leg swelling.  Gastrointestinal: Negative for nausea, vomiting, abdominal pain, diarrhea, constipation, blood in stool, abdominal distention and anal bleeding.  Genitourinary: Negative for dysuria, hematuria, flank pain and difficulty urinating.  Musculoskeletal: Negative for myalgias, back pain, joint swelling, arthralgias and gait problem.  Skin: Negative for color change, pallor, rash and wound.  Neurological: Negative for dizziness, tremors, weakness and light-headedness.  Hematological: Negative for adenopathy. Does not bruise/bleed easily.  Psychiatric/Behavioral: Negative for behavioral problems, confusion, sleep disturbance, dysphoric mood, decreased concentration and agitation.       Objective:   Physical Exam BP 128/72 mmHg  Pulse 63  Temp(Src) 98.4 F (36.9 C) (Oral)  Wt 144 lb (65.318 kg) Physical Exam  Constitutional: He is oriented to person, place, and time. He appears well-developed and well-nourished. No distress.  HENT:  Mouth/Throat: Oropharynx is clear and moist. No oropharyngeal exudate.  Cardiovascular: Normal rate, regular rhythm and normal heart sounds. Exam reveals no gallop and no friction rub.  No murmur heard.  Pulmonary/Chest: Effort normal and breath sounds normal. No respiratory distress. He has no wheezes.  Abdominal: Soft. Bowel sounds are normal. He exhibits no distension. There is no tenderness.  Lymphadenopathy:  He has no cervical adenopathy.  Neurological: He is alert and oriented to person, place, and  time.  Skin: Skin is warm and dry. No rash noted. No erythema.  Psychiatric: He has a normal mood and affect. His behavior is normal.          Assessment & Plan:  M.abscessus pulmonary  infection = will repeat chest ct to evaluate pulmonary nodules/bronchiectesus to see if having progression of lung disease in the last 6 months since we have not initiated treatment. He is otherwise asymptomatic. M. Abscessus treatment likely to be challenging due to resistant patterns. Will also try to do ethambutol sparing regimen due to his glaucoma.  25 min of face to face time spent with patient regarding treatment and work up of m.abscessus

## 2014-02-01 ENCOUNTER — Other Ambulatory Visit: Payer: Self-pay | Admitting: Internal Medicine

## 2014-02-01 ENCOUNTER — Other Ambulatory Visit: Payer: Medicare PPO

## 2014-02-01 DIAGNOSIS — A319 Mycobacterial infection, unspecified: Secondary | ICD-10-CM

## 2014-02-02 ENCOUNTER — Ambulatory Visit (HOSPITAL_COMMUNITY)
Admission: RE | Admit: 2014-02-02 | Discharge: 2014-02-02 | Disposition: A | Discharge status: Home / Self Care | Payer: Medicare PPO | Source: Ambulatory Visit | Attending: Internal Medicine | Admitting: Internal Medicine

## 2014-02-02 ENCOUNTER — Telehealth: Payer: Self-pay | Admitting: Licensed Clinical Social Worker

## 2014-02-02 ENCOUNTER — Encounter (HOSPITAL_COMMUNITY): Payer: Self-pay

## 2014-02-02 DIAGNOSIS — R918 Other nonspecific abnormal finding of lung field: Secondary | ICD-10-CM | POA: Diagnosis not present

## 2014-02-02 NOTE — Telephone Encounter (Signed)
Sputum results came back AFB  2+. Results will be faxed to the clinic and placed in Dr. Feliz BeamSnider's box.

## 2014-02-03 ENCOUNTER — Telehealth: Payer: Self-pay | Admitting: Internal Medicine

## 2014-02-03 NOTE — Telephone Encounter (Signed)
Gave him results of ct scan that it looked improved in left lower lobe, unchanged in right middle lobe as reviewed with radiology. He has recent sputum specimen sent in on 1/25 that is afb smear positive, cx pending. Patient at this time is asymptomatic, in the sense of no worsening of his chronic cough.  Awaiting id and sensitivities on 1/25 specimen. Suspect it is still m.abscessus.  i am not pushed to start treatment at this time since his isolate is highly resistant, requiring iv antibiotics.  He has a very active lifestyle including swimming, running, singing in choir. picc line would definitely impact his QoL. He is agreable to conservative management of watch and wait til we get results back in 2 months to decide further treatment options.  [facility] Danny B. Drue SecondSnider MD MPH [facility] [phone]

## 2014-02-05 NOTE — Telephone Encounter (Signed)
i saw results already. This is likely recurrence of m.abscessus. i have told patient the plan to catch up in 3-4 wk for treatment options

## 2014-02-10 ENCOUNTER — Telehealth: Payer: Self-pay | Admitting: *Deleted

## 2014-02-10 DIAGNOSIS — H4011X3 Primary open-angle glaucoma, severe stage: Secondary | ICD-10-CM | POA: Diagnosis not present

## 2014-02-10 NOTE — Telephone Encounter (Signed)
Critical value - ACID FAST BACILLI ISOLATED.  [facility] sending out for sensitivities.

## 2014-02-11 NOTE — Telephone Encounter (Signed)
I am aware of the AFB cx. We are just waiting for them to identify it to see if it is still the m.abscessus that he has. thx.

## 2014-02-22 ENCOUNTER — Ambulatory Visit: Payer: Medicare PPO | Admitting: Family Medicine

## 2014-03-05 LAB — AFB CULTURE WITH SMEAR (NOT AT ARMC)

## 2014-03-05 LAB — CP MYCOBACTERIAL IDENTIFICATION

## 2014-03-22 ENCOUNTER — Encounter: Payer: Self-pay | Admitting: Internal Medicine

## 2014-03-24 DIAGNOSIS — Z961 Presence of intraocular lens: Secondary | ICD-10-CM | POA: Diagnosis not present

## 2014-03-24 DIAGNOSIS — H4011X3 Primary open-angle glaucoma, severe stage: Secondary | ICD-10-CM | POA: Diagnosis not present

## 2014-03-29 ENCOUNTER — Ambulatory Visit (INDEPENDENT_AMBULATORY_CARE_PROVIDER_SITE_OTHER): Payer: Medicare PPO | Admitting: Internal Medicine

## 2014-03-29 ENCOUNTER — Encounter: Payer: Self-pay | Admitting: Internal Medicine

## 2014-03-29 VITALS — BP 167/88 | HR 61 | Temp 97.7°F | Wt 144.0 lb

## 2014-03-29 DIAGNOSIS — A319 Mycobacterial infection, unspecified: Secondary | ICD-10-CM | POA: Diagnosis not present

## 2014-03-29 DIAGNOSIS — R918 Other nonspecific abnormal finding of lung field: Secondary | ICD-10-CM | POA: Diagnosis not present

## 2014-04-01 NOTE — Progress Notes (Signed)
Subjective:    Patient ID: Danny Gross, male    DOB: 13-Apr-1934, 80 y.o.   MRN: [id number]  HPI 80yo M with good state of health however found to have pulmonary m.abscessus in August 2015. Repeat Chest CT showed improvement in pulmonary infiltrates 6 months after initial symptoms of shortness of breath and chronic cough. He is back to his baseline. We have been seeing him every 3 months to monitor his respiratory status to determine if need to treat  Micro: m.abscessus from 08/11/2013 Amikacin 8    S Cefoxitin 32    I cipro       > 4   R clarithormycin > 16  R Doxycycline>16   R linezolid 16    I moxifloxacin  >8   R tigecycline 1 Bactrim > 8/152     R  Active Ambulatory Problems    Diagnosis Date Noted  . Pulmonary infiltrates 07/13/2013  . Routine general medical examination at a health care facility 12/21/2013   Resolved Ambulatory Problems    Diagnosis Date Noted  . Dyspnea 07/27/2013   Past Medical History  Diagnosis Date  . Cataract   . Emphysema of lung      Review of Systems Review of Systems  Constitutional: Negative for fever, chills, diaphoresis, activity change, appetite change,  and unexpected weight change. + fatigue, needs 8 hrs of sleep HENT: Negative for congestion, sore throat, rhinorrhea, sneezing, trouble swallowing and sinus pressure.  Eyes: Negative for photophobia and visual disturbance.  Respiratory: Negative for cough, chest tightness, shortness of breath, wheezing and stridor.  Cardiovascular: Negative for chest pain, palpitations and leg swelling.  Gastrointestinal: Negative for nausea, vomiting, abdominal pain, diarrhea, constipation, blood in stool, abdominal distention and anal bleeding.  Genitourinary: Negative for dysuria, hematuria, flank pain and difficulty urinating.  Musculoskeletal: Negative for myalgias, back pain, joint swelling, arthralgias and gait problem.  Skin: Negative for color change, pallor, rash and wound.    Neurological: Negative for dizziness, tremors, weakness and light-headedness.  Hematological: Negative for adenopathy. Does not bruise/bleed easily.  Psychiatric/Behavioral: Negative for behavioral problems, confusion, sleep disturbance, dysphoric mood, decreased concentration and agitation.       Objective:   Physical Exam BP 167/88 mmHg  Pulse 61  Temp(Src) 97.7 F (36.5 C) (Oral)  Wt 144 lb (65.318 kg) Physical Exam  Constitutional: He is oriented to person, place, and time. He appears well-developed and well-nourished. No distress.  HENT:  Mouth/Throat: Oropharynx is clear and moist. No oropharyngeal exudate.  Cardiovascular: Normal rate, regular rhythm and normal heart sounds. Exam reveals no gallop and no friction rub.  No murmur heard.  Pulmonary/Chest: Effort normal and breath sounds normal. No respiratory distress. He has no wheezes.  Abdominal: Soft. Bowel sounds are normal. He exhibits no distension. There is no tenderness.  Lymphadenopathy:  He has no cervical adenopathy.  Neurological: He is alert and oriented to person, place, and time.  Skin: Skin is warm and dry. No rash noted. No erythema.  Psychiatric: He has a normal mood and affect. His behavior is normal.          Assessment & Plan:  M.abscessus pulmonary colonization vs. Infection = he appears that he is doing well, not complaining of any respiratory symptoms other than baseline cough in the morning, but not associated with shortness of breath. In light of improved chest CT, favoring not to treat at this time and watch if symptoms worsen. Will plan to do q 6 month Chest CT  and PFT to see if worsening function. His isolate is very resistant, thus far, it appears can possibly give tigecycline which gives n/v and amikacin (nephrotoxicity), no oral options,but linezolid is intermediate. Will send isolate [facility] for further susceptibiltiy testing.  rtc in august

## 2014-04-26 ENCOUNTER — Other Ambulatory Visit: Payer: Self-pay | Admitting: Dermatology

## 2014-06-09 LAB — REFERRED ASSAY

## 2014-06-24 DIAGNOSIS — H4011X3 Primary open-angle glaucoma, severe stage: Secondary | ICD-10-CM | POA: Diagnosis not present

## 2014-07-10 ENCOUNTER — Encounter: Payer: Self-pay | Admitting: Internal Medicine

## 2014-08-10 ENCOUNTER — Telehealth: Payer: Self-pay | Admitting: Internal Medicine

## 2014-08-10 NOTE — Telephone Encounter (Signed)
M.ABSCESSUS SUSCEPTIBILITIES 04/09/2014  Amikacin    16 S Kanamycin 8 S Tobramycin >16 R Cefoxitin 32 I Imipenem 8 I cipro  >8 R Doxy  >16 R moxi  >4 R tigecycline 0.5 S clarithro 4 I azithro 32 S augmentin >32/16  R Bactrim  R linezolid 8 I Clofazimine <0.5 S Clofaz+amikac S

## 2014-08-11 ENCOUNTER — Other Ambulatory Visit: Payer: Self-pay | Admitting: Internal Medicine

## 2014-08-11 ENCOUNTER — Encounter: Payer: Self-pay | Admitting: Internal Medicine

## 2014-08-11 ENCOUNTER — Encounter: Payer: Self-pay | Admitting: *Deleted

## 2014-08-11 DIAGNOSIS — A310 Pulmonary mycobacterial infection: Secondary | ICD-10-CM

## 2014-08-13 ENCOUNTER — Telehealth: Payer: Self-pay | Admitting: *Deleted

## 2014-08-13 NOTE — Telephone Encounter (Signed)
Authorization [id number] granted for Chest CT without contrast (CPT 71250), valid 08/13/14 - 09/12/14.  RN scheduled patient for CT 8/8 and follow up with Dr. Drue Second on 8/9, but patient has to schedule due to conflicts.  RN gave patient phone number to radiology scheduling [phone]) to change the CT, he understands this image should be completed before 9/4.  Patient is scheduled for follow up with Dr. Drue Second on 9/7 (next available). Pt verbalized understanding, agreement. Andree Coss, RN

## 2014-08-16 ENCOUNTER — Ambulatory Visit (HOSPITAL_COMMUNITY): Payer: Medicare PPO

## 2014-08-17 ENCOUNTER — Ambulatory Visit: Payer: Medicare PPO | Admitting: Internal Medicine

## 2014-08-19 ENCOUNTER — Ambulatory Visit (HOSPITAL_COMMUNITY)
Admission: RE | Admit: 2014-08-19 | Discharge: 2014-08-19 | Disposition: A | Discharge status: Home / Self Care | Payer: Medicare PPO | Source: Ambulatory Visit | Attending: Internal Medicine | Admitting: Internal Medicine

## 2014-08-19 DIAGNOSIS — J439 Emphysema, unspecified: Secondary | ICD-10-CM | POA: Diagnosis not present

## 2014-08-19 DIAGNOSIS — A310 Pulmonary mycobacterial infection: Secondary | ICD-10-CM | POA: Insufficient documentation

## 2014-08-19 DIAGNOSIS — J479 Bronchiectasis, uncomplicated: Secondary | ICD-10-CM | POA: Diagnosis not present

## 2014-08-27 ENCOUNTER — Encounter: Payer: Self-pay | Admitting: Internal Medicine

## 2014-09-15 ENCOUNTER — Ambulatory Visit: Payer: Medicare PPO | Admitting: Internal Medicine

## 2014-09-20 ENCOUNTER — Ambulatory Visit: Payer: Medicare PPO | Admitting: Internal Medicine

## 2014-09-30 IMAGING — CR DG CHEST 2V
2 series · 2 of 2 positions shown · non-contrast
Comparison: August 11, 2013 and July 27, 2013

CLINICAL DATA: Pulmonary infiltrate

EXAM:
CHEST  2 VIEW

[view not recorded (1 of 2)]
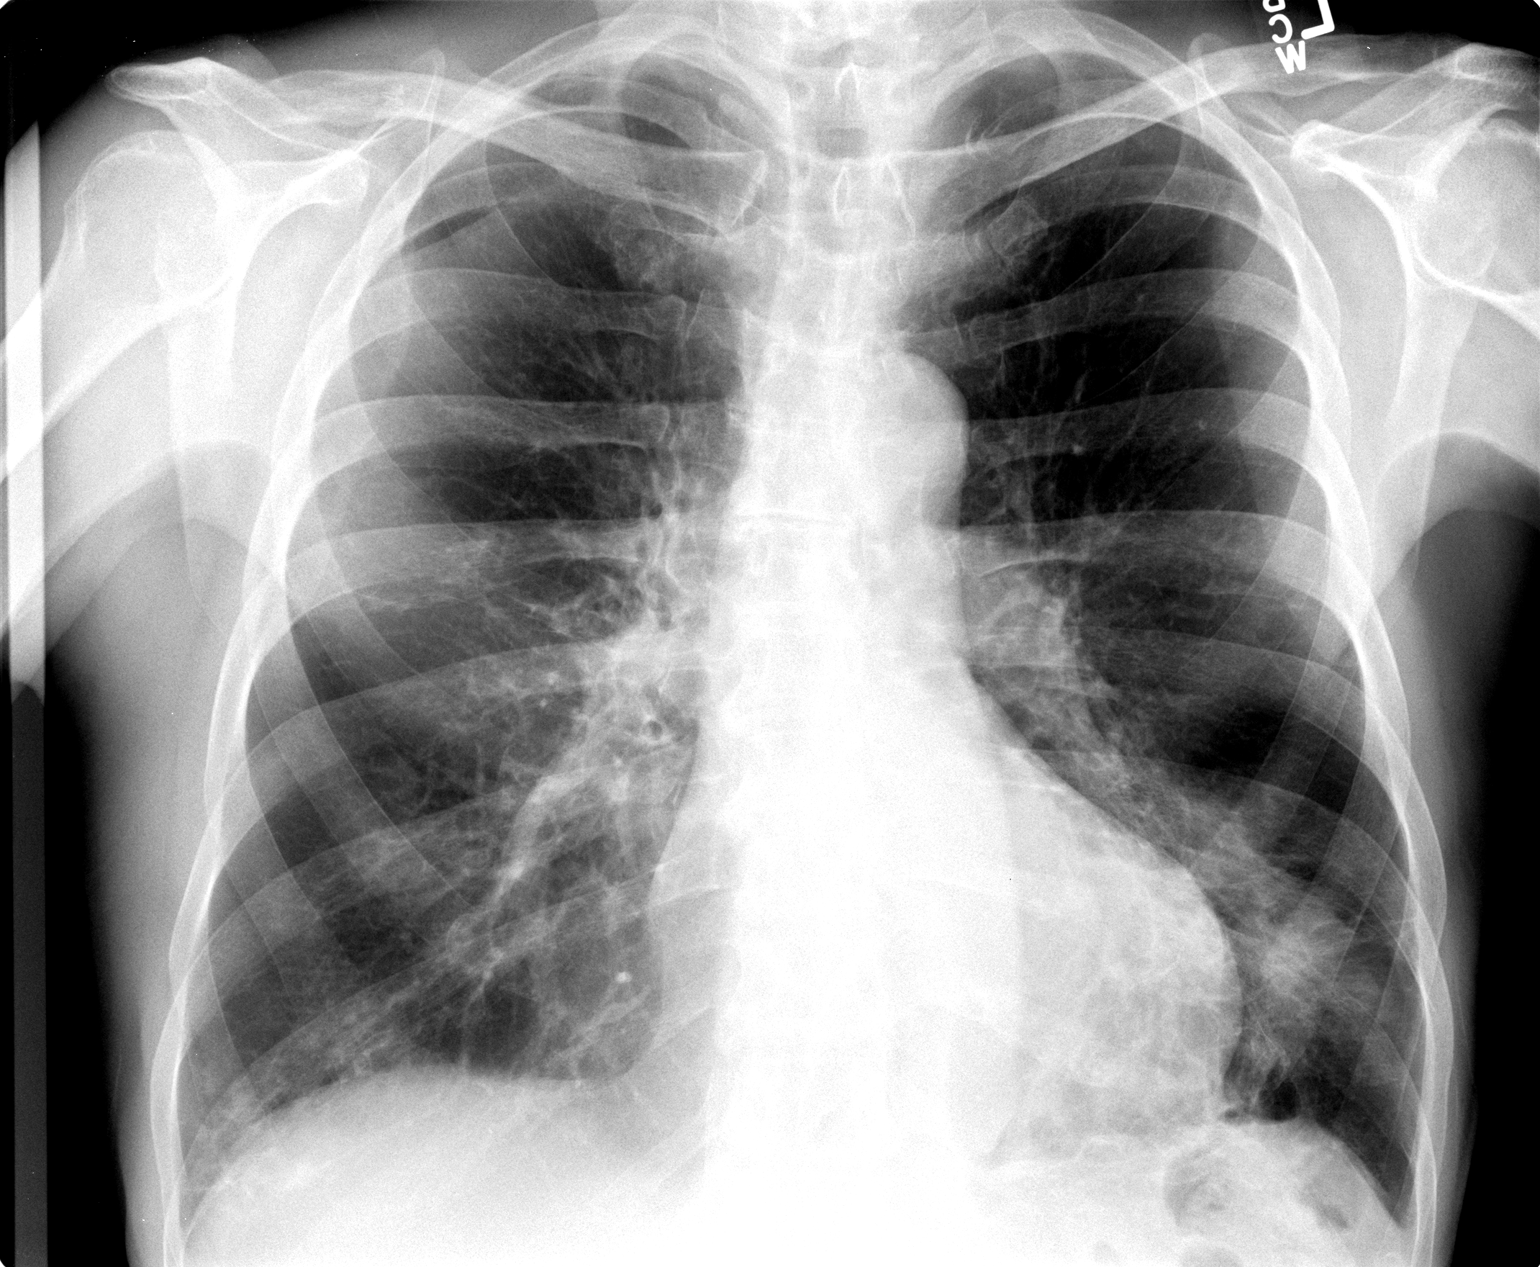

[view not recorded (2 of 2)]
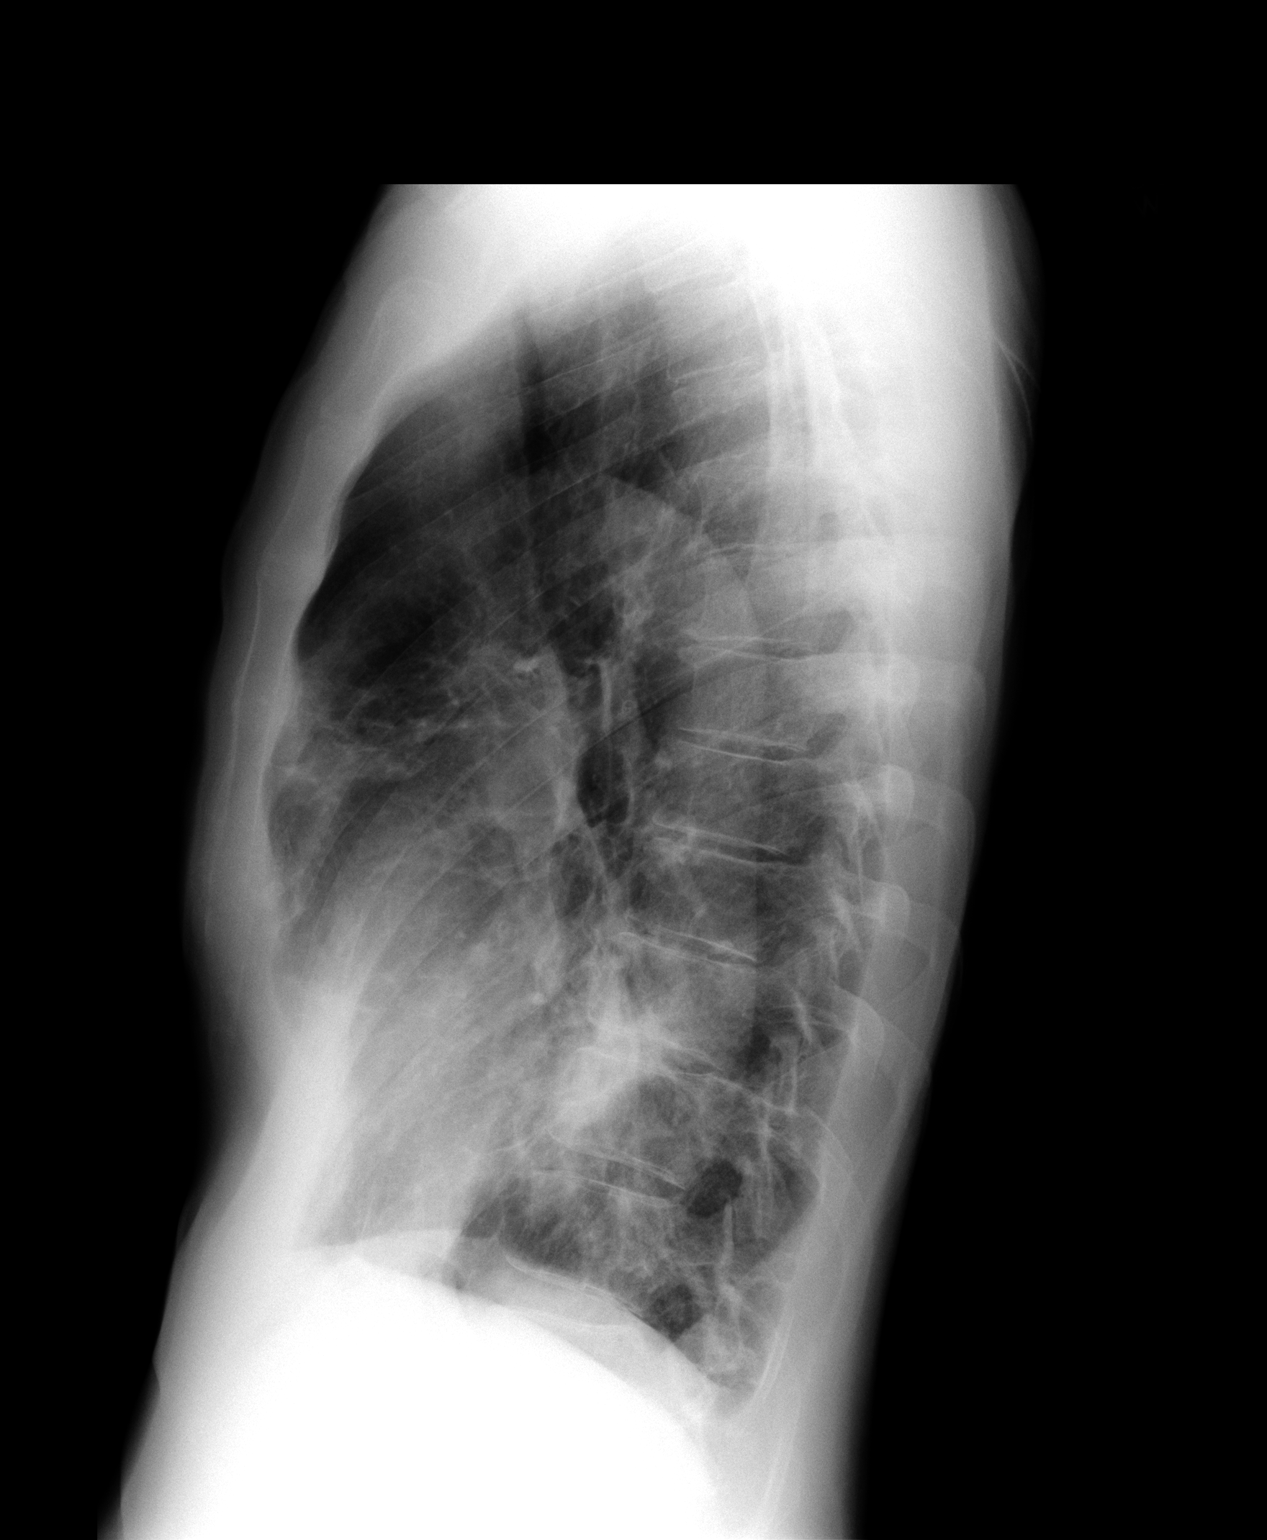

[2 of 2 positions shown; findings below may reference images not displayed]

FINDINGS: There is patchy opacity in the left lower lobe which has increased
compared to the July 2013 study but appears similar to the August 2013 study. There is patchy atelectatic change in the right mid and
lower lung zones. There is a degree of underlying emphysema. The
heart size is normal. The pulmonary vascularity reflects underlying
emphysema. No adenopathy.
IMPRESSION: Patchy infiltrate persists in the left base. Areas of mild
atelectatic change in the right mid lower lung zones on the right
are present. No change in cardiac silhouette. Underlying emphysema.

## 2014-10-04 ENCOUNTER — Ambulatory Visit (INDEPENDENT_AMBULATORY_CARE_PROVIDER_SITE_OTHER): Payer: Medicare PPO | Admitting: Internal Medicine

## 2014-10-04 ENCOUNTER — Other Ambulatory Visit (INDEPENDENT_AMBULATORY_CARE_PROVIDER_SITE_OTHER): Payer: Medicare PPO

## 2014-10-04 ENCOUNTER — Encounter: Payer: Self-pay | Admitting: Internal Medicine

## 2014-10-04 VITALS — BP 128/78 | HR 59 | Temp 98.1°F | Resp 16 | Ht 69.0 in | Wt 136.4 lb

## 2014-10-04 DIAGNOSIS — Z0000 Encounter for general adult medical examination without abnormal findings: Secondary | ICD-10-CM | POA: Diagnosis not present

## 2014-10-04 DIAGNOSIS — Z23 Encounter for immunization: Secondary | ICD-10-CM

## 2014-10-04 DIAGNOSIS — Z79899 Other long term (current) drug therapy: Secondary | ICD-10-CM | POA: Diagnosis not present

## 2014-10-04 DIAGNOSIS — Z1211 Encounter for screening for malignant neoplasm of colon: Secondary | ICD-10-CM

## 2014-10-04 DIAGNOSIS — R918 Other nonspecific abnormal finding of lung field: Secondary | ICD-10-CM

## 2014-10-04 DIAGNOSIS — R7301 Impaired fasting glucose: Secondary | ICD-10-CM | POA: Insufficient documentation

## 2014-10-04 LAB — LIPID PANEL
CHOL/HDL RATIO: 3
Cholesterol: 174 mg/dL (ref 0–200)
HDL: 51.3 mg/dL (ref >39.00)
LDL Cholesterol: 107 mg/dL — ABNORMAL HIGH (ref 0–99)
NONHDL: 122.52
Triglycerides: 78 mg/dL (ref 0.0–149.0)
VLDL: 15.6 mg/dL (ref 0.0–40.0)

## 2014-10-04 LAB — COMPREHENSIVE METABOLIC PANEL
ALT: 20 U/L (ref 0–53)
AST: 20 U/L (ref 0–37)
Albumin: 4 g/dL (ref 3.5–5.2)
Alkaline Phosphatase: 110 U/L (ref 39–117)
BUN: 28 mg/dL — AB (ref 6–23)
CHLORIDE: 107 meq/L (ref 96–112)
CO2: 25 mEq/L (ref 19–32)
Calcium: 9.4 mg/dL (ref 8.4–10.5)
Creatinine, Ser: 1.02 mg/dL (ref 0.40–1.50)
GFR: 75.05 mL/min (ref >60.00)
GLUCOSE: 104 mg/dL — AB (ref 70–99)
Potassium: 4 mEq/L (ref 3.5–5.1)
SODIUM: 139 meq/L (ref 135–145)
Total Bilirubin: 0.8 mg/dL (ref 0.2–1.2)
Total Protein: 7 g/dL (ref 6.0–8.3)

## 2014-10-04 LAB — CBC
HCT: 44.4 % (ref 39.0–52.0)
Hemoglobin: 14.4 g/dL (ref 13.0–17.0)
MCHC: 32.4 g/dL (ref 30.0–36.0)
MCV: 88.7 fl (ref 78.0–100.0)
Platelets: 144 10*3/uL — ABNORMAL LOW (ref 150.0–400.0)
RBC: 5 Mil/uL (ref 4.22–5.81)
RDW: 13.9 % (ref 11.5–15.5)
WBC: 6.3 10*3/uL (ref 4.0–10.5)

## 2014-10-04 LAB — HEMOGLOBIN A1C: Hgb A1c MFr Bld: 6 % (ref 4.6–6.5)

## 2014-10-04 NOTE — Assessment & Plan Note (Signed)
Seeing ID for his M abscessus. Recent CT scan reviewed with him at visit with some improvement in the left lung and worse in the RML. Previous cultures have been very resistant and symptoms limited. Mild cough during the day but exercise endurance maintained.

## 2014-10-04 NOTE — Assessment & Plan Note (Signed)
Referral for colonoscopy (will be last). Flu and pneumonia shot given and shingles shot documented to update his records. Is active and talked to him about skin cancer prevention with sunscreen and protective clothing. 10 year screening recommendations given to him at visit.

## 2014-10-04 NOTE — Assessment & Plan Note (Signed)
Previous HgA1c 6.2 and rechecking today.

## 2014-10-04 NOTE — Progress Notes (Signed)
   Subjective:    Patient ID: Danny Gross, male    DOB: 01/02/1935, 80 y.o.   MRN: [id number]  HPI Here for medicare wellness, no new complaints. Please see A/P for status and treatment of chronic medical problems.   Diet: heart healthy Physical activity: active daily Depression/mood screen: negative Hearing: intact to whispered voice Visual acuity: grossly normal, performs annual eye exam, glaucoma ADLs: capable Fall risk: none Home safety: good Cognitive evaluation: intact to orientation, naming, recall and repetition EOL planning: adv directives discussed  I have personally reviewed and have noted 1. The patient's medical and social history - reviewed today no changes 2. Their use of alcohol, tobacco or illicit drugs 3. Their current medications and supplements 4. The patient's functional ability including ADL's, fall risks, home safety risks and hearing or visual impairment. 5. Diet and physical activities 6. Evidence for depression or mood disorders 7. Care team reviewed and updated (available in snapshot)  Review of Systems  Constitutional: Negative for fever, activity change, appetite change, fatigue and unexpected weight change.  HENT: Negative.   Eyes: Negative.   Respiratory: Negative for cough, chest tightness, shortness of breath and wheezing.   Cardiovascular: Negative for chest pain, palpitations and leg swelling.  Gastrointestinal: Negative for nausea, abdominal pain, diarrhea, constipation and abdominal distention.  Musculoskeletal: Negative.   Skin: Negative.   Neurological: Negative.   Psychiatric/Behavioral: Negative.       Objective:   Physical Exam  Constitutional: He is oriented to person, place, and time. He appears well-developed and well-nourished.  HENT:  Head: Normocephalic and atraumatic.  Eyes: EOM are normal.  Neck: Normal range of motion.  Cardiovascular: Normal rate and regular rhythm.   Pulmonary/Chest: Effort normal and breath  sounds normal.  Abdominal: Soft. Bowel sounds are normal. He exhibits no distension. There is no tenderness. There is no rebound.  Neurological: He is alert and oriented to person, place, and time. Coordination normal.  Skin: Skin is warm and dry.  Psychiatric: He has a normal mood and affect.   Filed Vitals:   10/04/14 0820  BP: 128/78  Pulse: 59  Temp: 98.1 F (36.7 C)  TempSrc: Oral  Resp: 16  Height:  (1.753 m)  Weight: 136 lb 6.4 oz (61.871 kg)  SpO2: 97%      Assessment & Plan:  Flu shot and pneumonia 23 given at visit.

## 2014-10-04 NOTE — Progress Notes (Signed)
Pre visit review using our clinic review tool, if applicable. No additional management support is needed unless otherwise documented below in the visit note. 

## 2014-10-04 NOTE — Patient Instructions (Signed)
We have given you the flu and pneumonia shots today.   We will get you in for the colonoscopy as well.   We will check the labs for you today.  Health Maintenance A healthy lifestyle and preventative care can promote health and wellness.  Maintain regular health, dental, and eye exams.  Eat a healthy diet. Foods like vegetables, fruits, whole grains, low-fat dairy products, and lean protein foods contain the nutrients you need and are low in calories. Decrease your intake of foods high in solid fats, added sugars, and salt. Get information about a proper diet from your health care provider, if necessary.  Regular physical exercise is one of the most important things you can do for your health. Most adults should get at least 150 minutes of moderate-intensity exercise (any activity that increases your heart rate and causes you to sweat) each week. In addition, most adults need muscle-strengthening exercises on 2 or more days a week.   Maintain a healthy weight. The body mass index (BMI) is a screening tool to identify possible weight problems. It provides an estimate of body fat based on height and weight. Your health care provider can find your BMI and can help you achieve or maintain a healthy weight. For males 20 years and older:  A BMI below 18.5 is considered underweight.  A BMI of 18.5 to 24.9 is normal.  A BMI of 25 to 29.9 is considered overweight.  A BMI of 30 and above is considered obese.  Maintain normal blood lipids and cholesterol by exercising and minimizing your intake of saturated fat. Eat a balanced diet with plenty of fruits and vegetables. Blood tests for lipids and cholesterol should begin at age 20 and be repeated every 5 years. If your lipid or cholesterol levels are high, you are over age 23, or you are at high risk for heart disease, you may need your cholesterol levels checked more frequently.Ongoing high lipid and cholesterol levels should be treated with  medicines if diet and exercise are not working.  If you smoke, find out from your health care provider how to quit. If you do not use tobacco, do not start.  Lung cancer screening is recommended for adults aged 55-80 years who are at high risk for developing lung cancer because of a history of smoking. A yearly low-dose CT scan of the lungs is recommended for people who have at least a 30-pack-year history of smoking and are current smokers or have quit within the past 15 years. A pack year of smoking is smoking an average of 1 pack of cigarettes a day for 1 year (for example, a 30-pack-year history of smoking could mean smoking 1 pack a day for 30 years or 2 packs a day for 15 years). Yearly screening should continue until the smoker has stopped smoking for at least 15 years. Yearly screening should be stopped for people who develop a health problem that would prevent them from having lung cancer treatment.  If you choose to drink alcohol, do not have more than 2 drinks per day. One drink is considered to be 12 oz (360 mL) of beer, 5 oz (150 mL) of wine, or 1.5 oz (45 mL) of liquor.  Avoid the use of street drugs. Do not share needles with anyone. Ask for help if you need support or instructions about stopping the use of drugs.  High blood pressure causes heart disease and increases the risk of stroke. Blood pressure should be checked at least  every 1-2 years. Ongoing high blood pressure should be treated with medicines if weight loss and exercise are not effective.  If you are 16-3 years old, ask your health care provider if you should take aspirin to prevent heart disease.  Diabetes screening involves taking a blood sample to check your fasting blood sugar level. This should be done once every 3 years after age 73 if you are at a normal weight and without risk factors for diabetes. Testing should be considered at a younger age or be carried out more frequently if you are overweight and have at  least 1 risk factor for diabetes.  Colorectal cancer can be detected and often prevented. Most routine colorectal cancer screening begins at the age of 58 and continues through age 8. However, your health care provider may recommend screening at an earlier age if you have risk factors for colon cancer. On a yearly basis, your health care provider may provide home test kits to check for hidden blood in the stool. A small camera at the end of a tube may be used to directly examine the colon (sigmoidoscopy or colonoscopy) to detect the earliest forms of colorectal cancer. Talk to your health care provider about this at age 33 when routine screening begins. A direct exam of the colon should be repeated every 5-10 years through age 72, unless early forms of precancerous polyps or small growths are found.  People who are at an increased risk for hepatitis B should be screened for this virus. You are considered at high risk for hepatitis B if:  You were born in a country where hepatitis B occurs often. Talk with your health care provider about which countries are considered high risk.  Your parents were born in a high-risk country and you have not received a shot to protect against hepatitis B (hepatitis B vaccine).  You have HIV or AIDS.  You use needles to inject street drugs.  You live with, or have sex with, someone who has hepatitis B.  You are a man who has sex with other men (MSM).  You get hemodialysis treatment.  You take certain medicines for conditions like cancer, organ transplantation, and autoimmune conditions.  Hepatitis C blood testing is recommended for all people born from [birth year] through 1963 and any individual with known risk factors for hepatitis C.  Healthy men should no longer receive prostate-specific antigen (PSA) blood tests as part of routine cancer screening. Talk to your health care provider about prostate cancer screening.  Testicular cancer screening is not recommended  for adolescents or adult males who have no symptoms. Screening includes self-exam, a health care provider exam, and other screening tests. Consult with your health care provider about any symptoms you have or any concerns you have about testicular cancer.  Practice safe sex. Use condoms and avoid high-risk sexual practices to reduce the spread of sexually transmitted infections (STIs).  You should be screened for STIs, including gonorrhea and chlamydia if:  You are sexually active and are younger than 24 years.  You are older than 24 years, and your health care provider tells you that you are at risk for this type of infection.  Your sexual activity has changed since you were last screened, and you are at an increased risk for chlamydia or gonorrhea. Ask your health care provider if you are at risk.  If you are at risk of being infected with HIV, it is recommended that you take a prescription medicine daily to prevent  HIV infection. This is called pre-exposure prophylaxis (PrEP). You are considered at risk if:  You are a man who has sex with other men (MSM).  You are a heterosexual man who is sexually active with multiple partners.  You take drugs by injection.  You are sexually active with a partner who has HIV.  Talk with your health care provider about whether you are at high risk of being infected with HIV. If you choose to begin PrEP, you should first be tested for HIV. You should then be tested every 3 months for as long as you are taking PrEP.  Use sunscreen. Apply sunscreen liberally and repeatedly throughout the day. You should seek shade when your shadow is shorter than you. Protect yourself by wearing long sleeves, pants, a wide-brimmed hat, and sunglasses year round whenever you are outdoors.  Tell your health care provider of new moles or changes in moles, especially if there is a change in shape or color. Also, tell your health care provider if a mole is larger than the size  of a pencil eraser.  A one-time screening for abdominal aortic aneurysm (AAA) and surgical repair of large AAAs by ultrasound is recommended for men aged 18-75 years who are current or former smokers.  Stay current with your vaccines (immunizations). Document Released: 06/23/2007 Document Revised: 12/30/2012 Document Reviewed: 05/22/2010 [facility] Patient Information 2015 [place], [place]. This information is not intended to replace advice given to you by your health care provider. Make sure you discuss any questions you have with your health care provider.

## 2014-11-01 ENCOUNTER — Ambulatory Visit: Payer: Medicare PPO | Admitting: Internal Medicine

## 2014-11-01 DIAGNOSIS — Z85828 Personal history of other malignant neoplasm of skin: Secondary | ICD-10-CM | POA: Diagnosis not present

## 2014-11-01 DIAGNOSIS — D1801 Hemangioma of skin and subcutaneous tissue: Secondary | ICD-10-CM | POA: Diagnosis not present

## 2014-11-01 DIAGNOSIS — L821 Other seborrheic keratosis: Secondary | ICD-10-CM | POA: Diagnosis not present

## 2014-11-01 DIAGNOSIS — L812 Freckles: Secondary | ICD-10-CM | POA: Diagnosis not present

## 2014-11-01 DIAGNOSIS — L570 Actinic keratosis: Secondary | ICD-10-CM | POA: Diagnosis not present

## 2014-11-02 DIAGNOSIS — Z961 Presence of intraocular lens: Secondary | ICD-10-CM | POA: Diagnosis not present

## 2014-11-02 DIAGNOSIS — H401133 Primary open-angle glaucoma, bilateral, severe stage: Secondary | ICD-10-CM | POA: Diagnosis not present

## 2014-11-09 ENCOUNTER — Encounter: Payer: Self-pay | Admitting: Internal Medicine

## 2014-11-09 ENCOUNTER — Ambulatory Visit (INDEPENDENT_AMBULATORY_CARE_PROVIDER_SITE_OTHER): Payer: Medicare PPO | Admitting: Internal Medicine

## 2014-11-09 VITALS — BP 164/73 | HR 63 | Temp 97.4°F | Ht 68.0 in | Wt 138.0 lb

## 2014-11-09 DIAGNOSIS — A310 Pulmonary mycobacterial infection: Secondary | ICD-10-CM | POA: Diagnosis not present

## 2014-11-09 NOTE — Progress Notes (Signed)
Subjective:    Patient ID: Danny RutterGerald W Chatwin, male    DOB: 1934-11-08, 80 y.o.   MRN: [id number]  HPI 80yo M with m.abscessus pulmonary disease, thus far untreated due to being asymptomatic. Since we last saw him, he reports that he has more productive cough after singing. He does not necessarily report any more fatigue. He does have a 6lb weight loss in 6 months unintentional No fever, chills, nightsweats  No Known Allergies Current Outpatient Prescriptions on File Prior to Visit  Medication Sig Dispense Refill  . acetaZOLAMIDE (DIAMOX) 500 MG capsule Take 500 mg by mouth 2 (two) times a week.     . brimonidine-timolol (COMBIGAN) 0.2-0.5 % ophthalmic solution Place 1 drop into both eyes every 12 (twelve) hours.    . Cholecalciferol (VITAMIN D PO) Once every wk    . dorzolamide (TRUSOPT) 2 % ophthalmic solution Place 1 drop into both eyes 2 (two) times daily.    Marland Kitchen. guaiFENesin (MUCINEX) 600 MG 12 hr tablet Take 600 mg by mouth 2 (two) times daily as needed.    . latanoprost (XALATAN) 0.005 % ophthalmic solution Place 1 drop into both eyes at bedtime.    Marland Kitchen. Respiratory Therapy Supplies (FLUTTER) DEVI As directed. (Patient not taking: Reported on 11/09/2014) 1 each 0   No current facility-administered medications on file prior to visit.   Active Ambulatory Problems    Diagnosis Date Noted  . Pulmonary infiltrates 07/13/2013  . Routine general medical examination at a health care facility 12/21/2013  . Impaired fasting glucose 10/04/2014   Resolved Ambulatory Problems    Diagnosis Date Noted  . Dyspnea 07/27/2013   Past Medical History  Diagnosis Date  . Cataract   . Emphysema of lung [facility](HCC)    Social History  Substance Use Topics  . Smoking status: Never Smoker   . Smokeless tobacco: Never Used  . Alcohol Use: Yes     Comment: cocktail daily   family history includes Cancer in his maternal aunt.   Review of Systems 10 point ros is reviewed, positive pertinents listed above   Objective:   Physical Exam BP 164/73 mmHg  Pulse 63  Temp(Src) 97.4 F (36.3 C) (Oral)  Ht 5\' 8"  (1.727 m)  Wt 138 lb (62.596 kg)  BMI 20.99 kg/m2 Physical Exam  Constitutional: He is oriented to person, place, and time. He appears well-developed and well-nourished. No distress.  HENT:  Mouth/Throat: Oropharynx is clear and moist. No oropharyngeal exudate.  Cardiovascular: Normal rate, regular rhythm and normal heart sounds. Exam reveals no gallop and no friction rub.  No murmur heard.  Pulmonary/Chest: Effort normal and breath sounds normal. No respiratory distress. He has no wheezes.    CT scan reviewed showing some areas of new involvement       Assessment & Plan:  Hx of m.abscessus pulmonary disease = we willl get sputum cx. Defer treatment for now since not significantly symptomatic. He has resistant strain which would require IV therapy that contains aminoglycoside and possibly tigecycline both with large side effect profile. It would impair his current quality of life, likely won't be able to swim during extended period of time.   i am concerned that he is starting to have more symptoms than previously since he has had some weight loss and abit more coughing, though he has not had any change in his active social life and exercise schedule. His CT in September showed areas of improvement but also areas that are new. We discussed that  we will likely initiate treatment if symptoms worsen. For now due to their holiday travels. Will see him back in January to reassess.

## 2014-11-16 ENCOUNTER — Other Ambulatory Visit: Payer: Self-pay | Admitting: Internal Medicine

## 2014-11-16 DIAGNOSIS — A310 Pulmonary mycobacterial infection: Secondary | ICD-10-CM | POA: Diagnosis not present

## 2014-12-21 LAB — CP MYCOBACTERIAL IDENTIFICATION

## 2015-01-10 LAB — AFB CULTURE WITH SMEAR (NOT AT ARMC)

## 2015-02-14 IMAGING — CT CT CHEST W/O CM
2 of 5 series · 15 of 36 positions shown, 18 images · non-contrast
Comparison: CT chest of 07/02/1998

ADDENDUM:
In review of comparison exam from 06/27/2013, there is considerable
improvement in the nodular consolidative pattern within the left
lower lobe now a reticular nodular pattern suggesting a positive
response to therapy. There is some increased nodularity in the right
middle lobe as described below.

Findings conveyed [place] on 02/03/2014  at[DATE].
CLINICAL DATA: Pulmonary nodules, followup
EXAM:
CT CHEST WITHOUT CONTRAST
TECHNIQUE: Multidetector CT imaging of the chest was performed following the
standard protocol without IV contrast..

[Series 2: chest w/o st · axial · non-contrast · 0.78mm/px · z∈[-322,-22]mm · 12 of 72 slices shown, 15 images]
[im 6/72  mediastinal]
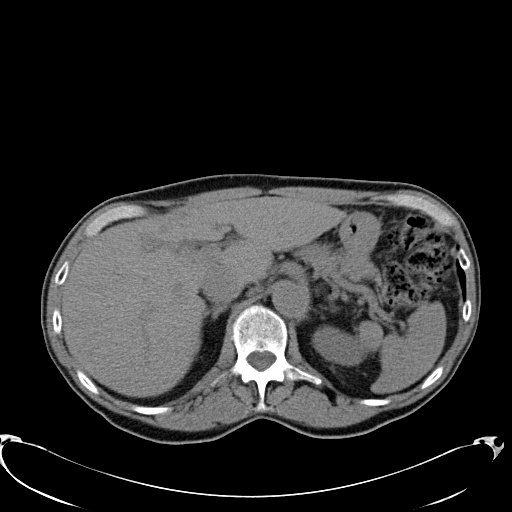
[im 6/72  lung]
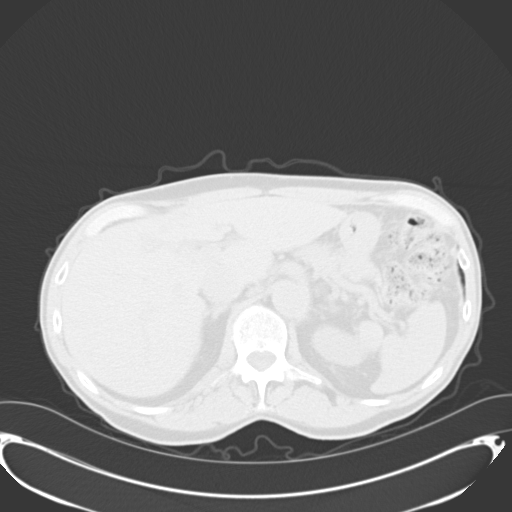
[im 11/72  lung]
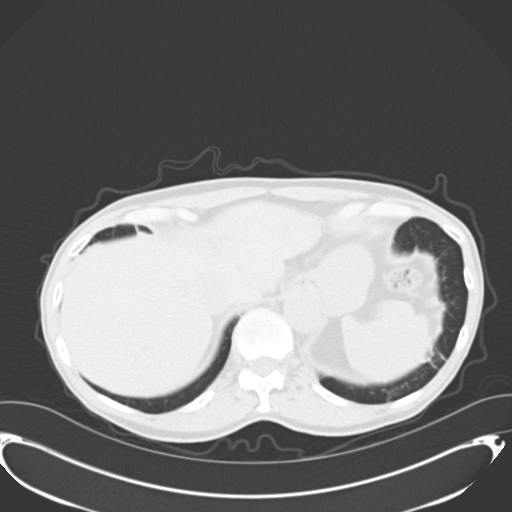
[im 17/72  lung]
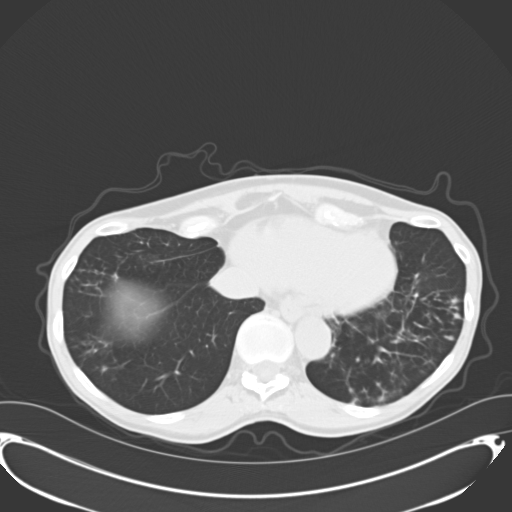
[im 22/72  lung]
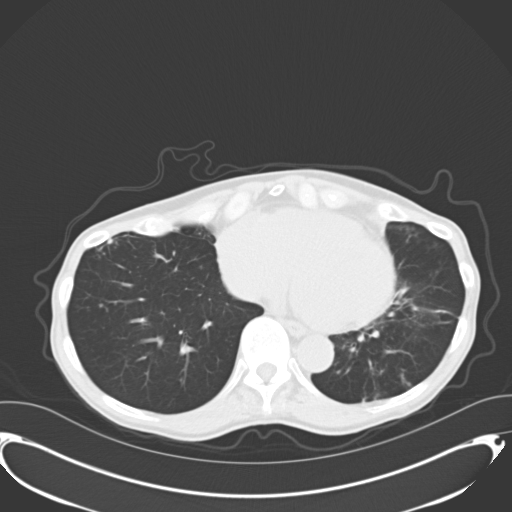
[im 28/72  mediastinal]
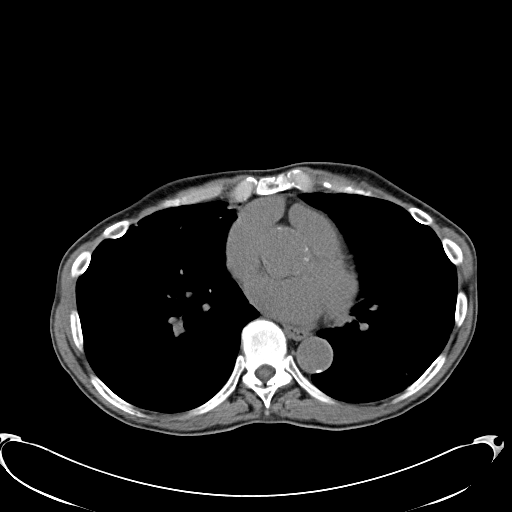
[im 28/72  lung]
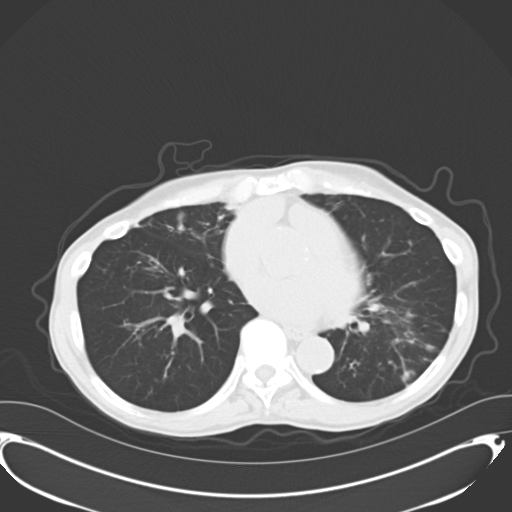
[im 33/72  lung]
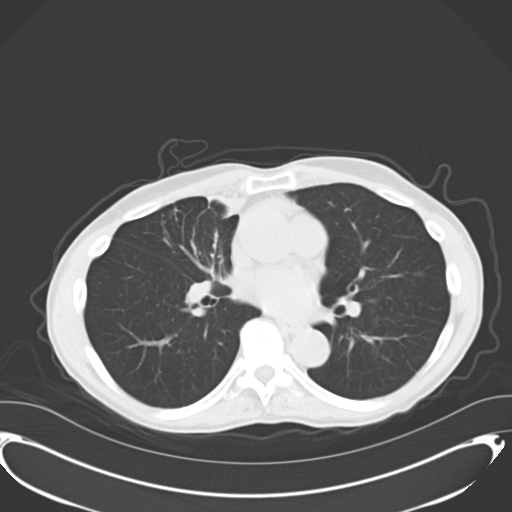
[im 39/72  lung]
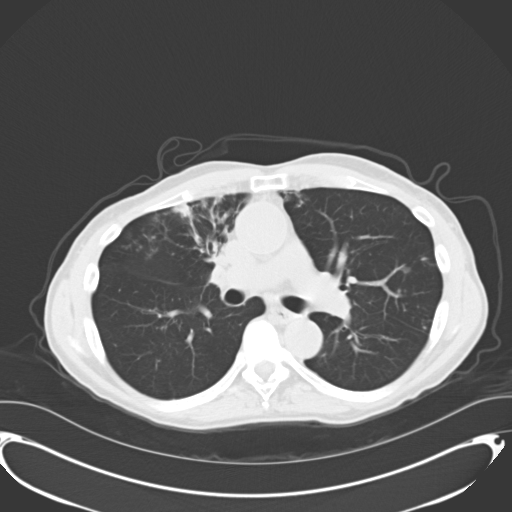
[im 44/72  lung]
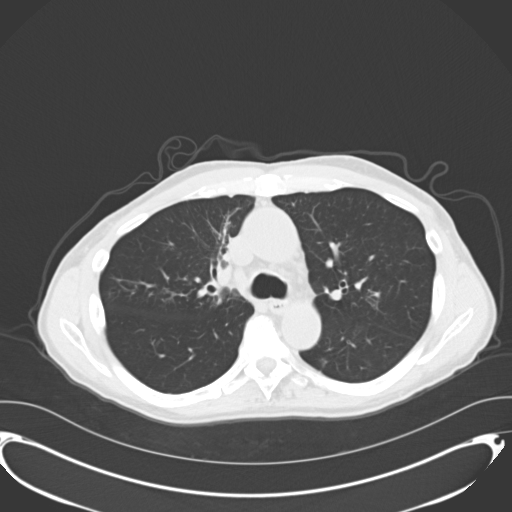
[im 50/72  mediastinal]
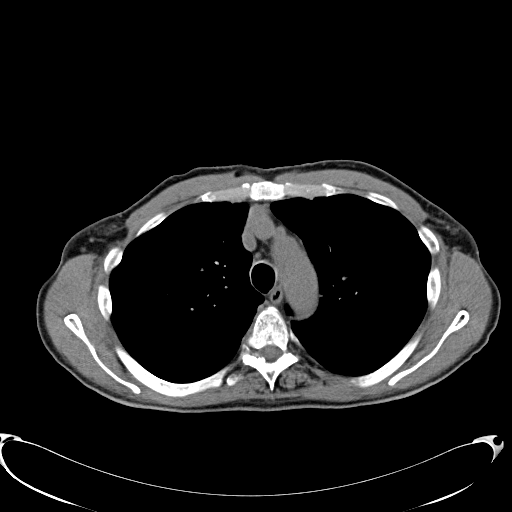
[im 50/72  lung]
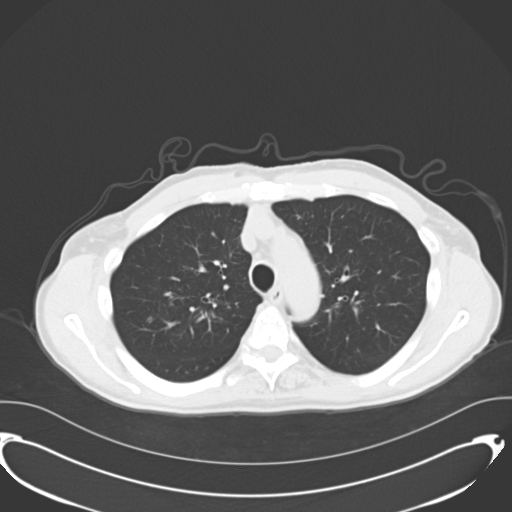
[im 55/72  lung]
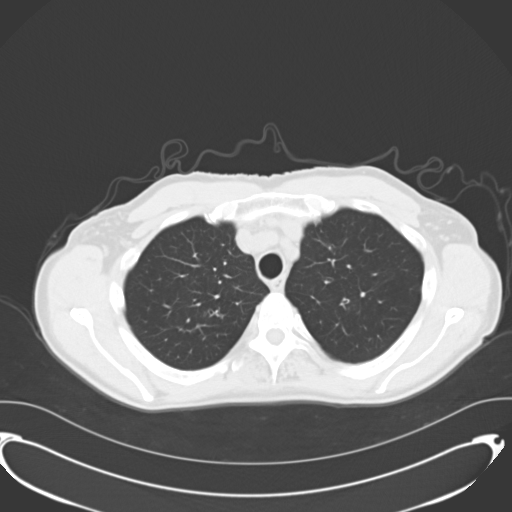
[im 61/72  lung]
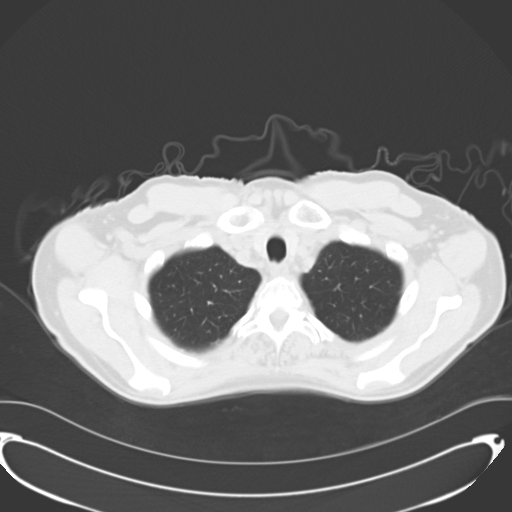
[im 66/72  lung]
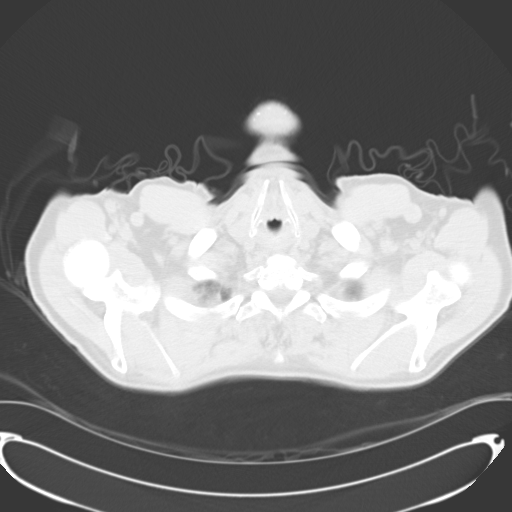

[Series 602: <mpr thick range> · coronal · 0.78mm/px · 3 of 114 slices shown]
[im 23/114  lung]
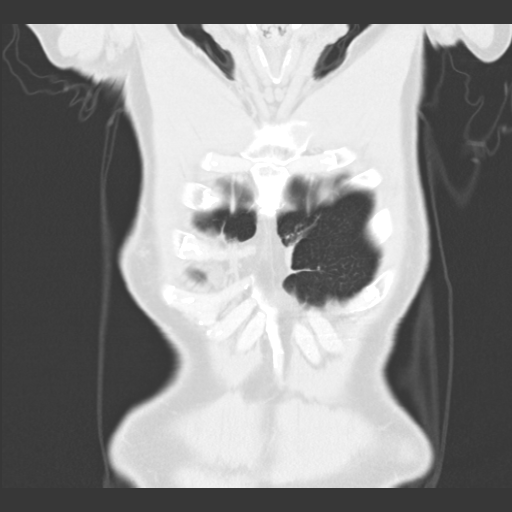
[im 46/114  lung]
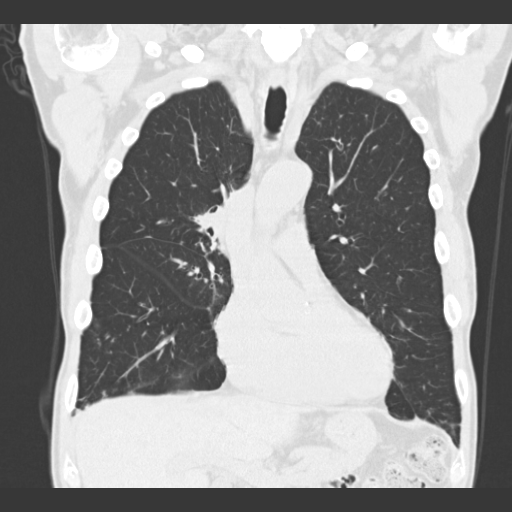
[im 68/114  lung]
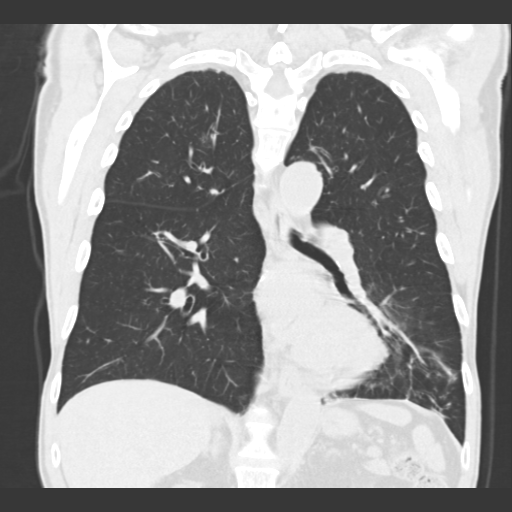

[15 of 36 positions shown; findings below may reference images not displayed]

FINDINGS: Some of the previously described areas of nodular opacities
throughout the lungs have improved in the interval. However, there
is more parenchymal opacity within the anterior inferior right upper
lobe. In addition a masslike opacity in the anteromedial right upper
lobe persists measuring 33 x 19 mm compared to 30 x 18 mm
previously. Although this may also be inflammatory, an underlying
neoplasm cannot be excluded. Also some of the persistent areas of
reticular nodular "tree-in-bud" in the lower lobes and within the
right upper lobe persist. These findings are suggestive of an
atypical pneumonia such as mycobacterium avium complex. In addition,
a portion of the opacity in the anterior inferior right upper lobe
does appear to demonstrate bronchiectatic change which also is
suggestive of an atypical infection. No pleural effusion is seen.
The central airway is patent.

On soft tissue window images, the thyroid gland is unremarkable. On
this unenhanced study, no mediastinal or hilar adenopathy is seen
with only small mediastinal nodes a appearing stable. A cyst
emanates from the upper pole of the left kidney as noted previously.
IMPRESSION: 1. Waxing and waning of nodular lung infiltrates with a somewhat
tree-in-bud appearance as well as bronchiectatic change in the right
middle lobe. These findings are suggestive of an atypical infection
such as mycobacterium avium complex. Correlate clinically.
2. Masslike opacity in the anterior inferior right upper lobe
medially may be inflammatory, but an underlying neoplasm cannot be
excluded.

## 2015-02-28 ENCOUNTER — Ambulatory Visit: Payer: Medicare PPO | Admitting: Internal Medicine

## 2015-03-03 ENCOUNTER — Encounter: Payer: Self-pay | Admitting: Internal Medicine

## 2015-03-03 ENCOUNTER — Ambulatory Visit (INDEPENDENT_AMBULATORY_CARE_PROVIDER_SITE_OTHER): Payer: Medicare Other | Admitting: Internal Medicine

## 2015-03-03 VITALS — BP 127/79 | HR 64 | Temp 97.9°F | Wt 147.0 lb

## 2015-03-03 DIAGNOSIS — R918 Other nonspecific abnormal finding of lung field: Secondary | ICD-10-CM

## 2015-03-03 NOTE — Progress Notes (Signed)
  RFV: m.abscess Subjective:    Patient ID: Danny Gross, male    DOB: 1934/12/27, 81 y.o.   MRN: [id number]  HPI 81yo M with bronchiectasis and hx of colonization with m.abscessus. We have not treated him yet since he has minimal symptoms though chest CT serial imaging suggest some mild progression of disease. He reports he is not as fatigue. Running daily. Continues to take part with choir. He does take daily naps. Mrs. Huckins agrees that he does not appear to have chronic cough.  Previous susceptibilities include:  Amikacin 16S Kanamycin8S Tobramycin>16R Cefoxitin32I Imipenem8I cipro>8R Doxy>16R moxi>4R tigecycline0.5S clarithro4I azithro32S augmentin>32/16 R BactrimR linezolid8I Clofazimine<0.5S Clofaz+amikacS  Review of Systems Daily morning cough that clears. No shortness of breath. Mild fatigue. Otherwise 10 point ros is negative    Objective:   Physical Exam BP 127/79 mmHg  Pulse 64  Temp(Src) 97.9 F (36.6 C) (Oral)  Wt 147 lb (66.679 kg) Physical Exam  Constitutional: He is oriented to person, place, and time. He appears well-developed and well-nourished. No distress.  HENT:  Mouth/Throat: Oropharynx is clear and moist. No oropharyngeal exudate.  Cardiovascular: Normal rate, regular rhythm and normal heart sounds. Exam reveals no gallop and no friction rub.  No murmur heard.  Pulmonary/Chest: Effort normal and breath sounds normal. No respiratory distress. He has no wheezes.  Abdominal: Soft. Bowel sounds are normal. He exhibits no distension. There is no tenderness.  Lymphadenopathy:  He has no cervical adenopathy.  Neurological: He is alert and oriented to person, place, and time.  Skin: Skin is  warm and dry. No rash noted. No erythema.  Psychiatric: He has a normal mood and affect. His behavior is normal.       Assessment & Plan:  M.abscessus pulmonary disease= continue to monitor for worsening symptoms to decide if need to treat. For now return back to clinic in 4  Month, will repeat chest CT at that time to decide treatment warranted given clinical picture  Bronchiectasis = no exacerbation at this time.

## 2015-04-12 ENCOUNTER — Telehealth: Payer: Self-pay
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
## 2015-04-18 NOTE — Telephone Encounter (Signed)
Fine

## 2015-05-10 ENCOUNTER — Ambulatory Visit (INDEPENDENT_AMBULATORY_CARE_PROVIDER_SITE_OTHER): Payer: Medicare Other | Admitting: Internal Medicine

## 2015-05-10 ENCOUNTER — Encounter: Payer: Self-pay | Admitting: Internal Medicine

## 2015-05-10 VITALS — BP 140/90 | HR 68 | Temp 97.8°F | Ht 69.0 in | Wt 144.0 lb

## 2015-05-10 DIAGNOSIS — Z0000 Encounter for general adult medical examination without abnormal findings: Secondary | ICD-10-CM | POA: Diagnosis not present

## 2015-05-10 DIAGNOSIS — R7301 Impaired fasting glucose: Secondary | ICD-10-CM | POA: Diagnosis not present

## 2015-05-10 NOTE — Assessment & Plan Note (Signed)
Checking labs, cologuard ordered. Given 10 year screening recommendations. Counseled on the need for exercise and sun safety and mole surveillance. Immunizations up to date.

## 2015-05-10 NOTE — Progress Notes (Signed)
Pre visit review using our clinic review tool, if applicable. No additional management support is needed unless otherwise documented below in the visit note. 

## 2015-05-10 NOTE — Patient Instructions (Signed)
We are going to have them send a cologuard test to your house. It has a 95% chance of finding any polyps and about a 5% false positive rate. This wil be your last colon cancer screening.   Health Maintenance, Male A healthy lifestyle and preventative care can promote health and wellness.  Maintain regular health, dental, and eye exams.  Eat a healthy diet. Foods like vegetables, fruits, whole grains, low-fat dairy products, and lean protein foods contain the nutrients you need and are low in calories. Decrease your intake of foods high in solid fats, added sugars, and salt. Get information about a proper diet from your health care provider, if necessary.  Regular physical exercise is one of the most important things you can do for your health. Most adults should get at least 150 minutes of moderate-intensity exercise (any activity that increases your heart rate and causes you to sweat) each week. In addition, most adults need muscle-strengthening exercises on 2 or more days a week.   Maintain a healthy weight. The body mass index (BMI) is a screening tool to identify possible weight problems. It provides an estimate of body fat based on height and weight. Your health care provider can find your BMI and can help you achieve or maintain a healthy weight. For males 20 years and older:  A BMI below 18.5 is considered underweight.  A BMI of 18.5 to 24.9 is normal.  A BMI of 25 to 29.9 is considered overweight.  A BMI of 30 and above is considered obese.  Maintain normal blood lipids and cholesterol by exercising and minimizing your intake of saturated fat. Eat a balanced diet with plenty of fruits and vegetables. Blood tests for lipids and cholesterol should begin at age 79 and be repeated every 5 years. If your lipid or cholesterol levels are high, you are over age 10250, or you are at high risk for heart disease, you may need your cholesterol levels checked more frequently.Ongoing high lipid and  cholesterol levels should be treated with medicines if diet and exercise are not working.  If you smoke, find out from your health care provider how to quit. If you do not use tobacco, do not start.  Lung cancer screening is recommended for adults aged 55-80 years who are at high risk for developing lung cancer because of a history of smoking. A yearly low-dose CT scan of the lungs is recommended for people who have at least a 30-pack-year history of smoking and are current smokers or have quit within the past 15 years. A pack year of smoking is smoking an average of 1 pack of cigarettes a day for 1 year (for example, a 30-pack-year history of smoking could mean smoking 1 pack a day for 30 years or 2 packs a day for 15 years). Yearly screening should continue until the smoker has stopped smoking for at least 15 years. Yearly screening should be stopped for people who develop a health problem that would prevent them from having lung cancer treatment.  If you choose to drink alcohol, do not have more than 2 drinks per day. One drink is considered to be 12 oz (360 mL) of beer, 5 oz (150 mL) of wine, or 1.5 oz (45 mL) of liquor.  Avoid the use of street drugs. Do not share needles with anyone. Ask for help if you need support or instructions about stopping the use of drugs.  High blood pressure causes heart disease and increases the risk of stroke.  High blood pressure is more likely to develop in:  People who have blood pressure in the end of the normal range (100-139/85-89 mm Hg).  People who are overweight or obese.  People who are African American.  If you are 53-75 years of age, have your blood pressure checked every 3-5 years. If you are 37 years of age or older, have your blood pressure checked every year. You should have your blood pressure measured twice--once when you are at a hospital or clinic, and once when you are not at a hospital or clinic. Record the average of the two measurements. To  check your blood pressure when you are not at a hospital or clinic, you can use:  An automated blood pressure machine at a pharmacy.  A home blood pressure monitor.  If you are 45-3 years old, ask your health care provider if you should take aspirin to prevent heart disease.  Diabetes screening involves taking a blood sample to check your fasting blood sugar level. This should be done once every 3 years after age 53 if you are at a normal weight and without risk factors for diabetes. Testing should be considered at a younger age or be carried out more frequently if you are overweight and have at least 1 risk factor for diabetes.  Colorectal cancer can be detected and often prevented. Most routine colorectal cancer screening begins at the age of 38 and continues through age 69. However, your health care provider may recommend screening at an earlier age if you have risk factors for colon cancer. On a yearly basis, your health care provider may provide home test kits to check for hidden blood in the stool. A small camera at the end of a tube may be used to directly examine the colon (sigmoidoscopy or colonoscopy) to detect the earliest forms of colorectal cancer. Talk to your health care provider about this at age 76 when routine screening begins. A direct exam of the colon should be repeated every 5-10 years through age 52, unless early forms of precancerous polyps or small growths are found.  People who are at an increased risk for hepatitis B should be screened for this virus. You are considered at high risk for hepatitis B if:  You were born in a country where hepatitis B occurs often. Talk with your health care provider about which countries are considered high risk.  Your parents were born in a high-risk country and you have not received a shot to protect against hepatitis B (hepatitis B vaccine).  You have HIV or AIDS.  You use needles to inject street drugs.  You live with, or have sex  with, someone who has hepatitis B.  You are a man who has sex with other men (MSM).  You get hemodialysis treatment.  You take certain medicines for conditions like cancer, organ transplantation, and autoimmune conditions.  Hepatitis C blood testing is recommended for all people born from [birth year] through 1965 and any individual with known risk factors for hepatitis C.  Healthy men should no longer receive prostate-specific antigen (PSA) blood tests as part of routine cancer screening. Talk to your health care provider about prostate cancer screening.  Testicular cancer screening is not recommended for adolescents or adult males who have no symptoms. Screening includes self-exam, a health care provider exam, and other screening tests. Consult with your health care provider about any symptoms you have or any concerns you have about testicular cancer.  Practice safe sex. Use condoms  and avoid high-risk sexual practices to reduce the spread of sexually transmitted infections (STIs).  You should be screened for STIs, including gonorrhea and chlamydia if:  You are sexually active and are younger than 24 years.  You are older than 24 years, and your health care provider tells you that you are at risk for this type of infection.  Your sexual activity has changed since you were last screened, and you are at an increased risk for chlamydia or gonorrhea. Ask your health care provider if you are at risk.  If you are at risk of being infected with HIV, it is recommended that you take a prescription medicine daily to prevent HIV infection. This is called pre-exposure prophylaxis (PrEP). You are considered at risk if:  You are a man who has sex with other men (MSM).  You are a heterosexual man who is sexually active with multiple partners.  You take drugs by injection.  You are sexually active with a partner who has HIV.  Talk with your health care provider about whether you are at high risk of being  infected with HIV. If you choose to begin PrEP, you should first be tested for HIV. You should then be tested every 3 months for as long as you are taking PrEP.  Use sunscreen. Apply sunscreen liberally and repeatedly throughout the day. You should seek shade when your shadow is shorter than you. Protect yourself by wearing long sleeves, pants, a wide-brimmed hat, and sunglasses year round whenever you are outdoors.  Tell your health care provider of new moles or changes in moles, especially if there is a change in shape or color. Also, tell your health care provider if a mole is larger than the size of a pencil eraser.  A one-time screening for abdominal aortic aneurysm (AAA) and surgical repair of large AAAs by ultrasound is recommended for men aged 25-75 years who are current or former smokers.  Stay current with your vaccines (immunizations).   This information is not intended to replace advice given to you by your health care provider. Make sure you discuss any questions you have with your health care provider.   Document Released: 06/23/2007 Document Revised: 01/15/2014 Document Reviewed: 05/22/2010 Elsevier Interactive Patient Education [employer].

## 2015-05-10 NOTE — Progress Notes (Signed)
   Subjective:    Patient ID: Danny Gross, male    DOB: 26-Sep-1934, 81 y.o.   MRN: [id number]  HPI The patient is a 81 YO man coming in for wellness. No new concerns.   PMH, FMH, social history reviewed and updated  Review of Systems  Constitutional: Negative for fever, activity change, appetite change, fatigue and unexpected weight change.  HENT: Negative.   Eyes: Negative.   Respiratory: Negative for cough, chest tightness, shortness of breath and wheezing.   Cardiovascular: Negative for chest pain, palpitations and leg swelling.  Gastrointestinal: Negative for nausea, abdominal pain, diarrhea, constipation and abdominal distention.  Musculoskeletal: Negative.   Skin: Negative.   Neurological: Negative.   Psychiatric/Behavioral: Negative.       Objective:   Physical Exam  Constitutional: He is oriented to person, place, and time. He appears well-developed and well-nourished.  HENT:  Head: Normocephalic and atraumatic.  Eyes: EOM are normal.  Neck: Normal range of motion.  Cardiovascular: Normal rate and regular rhythm.   Pulmonary/Chest: Effort normal and breath sounds normal.  Abdominal: Soft. Bowel sounds are normal. He exhibits no distension. There is no tenderness. There is no rebound.  Neurological: He is alert and oriented to person, place, and time. Coordination normal.  Skin: Skin is warm and dry.  Psychiatric: He has a normal mood and affect.   Filed Vitals:   05/10/15 1305  BP: 140/90  Pulse: 68  Temp: 97.8 F (36.6 C)  TempSrc: Oral  Height: 5\' 9"  (1.753 m)  Weight: 144 lb (65.318 kg)  SpO2: 97%      Assessment & Plan:  Cologuard ordered
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
## 2015-05-10 NOTE — Progress Notes (Signed)
Medical screening examination/treatment/procedure(s) were performed by non-physician practitioner and as supervising physician I was immediately available for consultation/collaboration. I agree with above. Elizabeth A Crawford, MD 

## 2015-05-10 NOTE — Assessment & Plan Note (Signed)
Checking HgA1c today, adjust as needed.

## 2015-05-23 LAB — COLOGUARD: Cologuard: NEGATIVE

## 2015-05-30 ENCOUNTER — Other Ambulatory Visit (INDEPENDENT_AMBULATORY_CARE_PROVIDER_SITE_OTHER): Payer: Medicare Other

## 2015-05-30 DIAGNOSIS — Z0000 Encounter for general adult medical examination without abnormal findings: Secondary | ICD-10-CM

## 2015-05-30 LAB — LIPID PANEL
Cholesterol: 201 mg/dL — ABNORMAL HIGH (ref 0–200)
HDL: 50 mg/dL (ref >39.00)
LDL Cholesterol: 133 mg/dL — ABNORMAL HIGH (ref 0–99)
NonHDL: 150.79
Total CHOL/HDL Ratio: 4
Triglycerides: 87 mg/dL (ref 0.0–149.0)
VLDL: 17.4 mg/dL (ref 0.0–40.0)

## 2015-05-30 LAB — COMPREHENSIVE METABOLIC PANEL
ALBUMIN: 4.1 g/dL (ref 3.5–5.2)
ALK PHOS: 87 U/L (ref 39–117)
ALT: 14 U/L (ref 0–53)
AST: 18 U/L (ref 0–37)
BUN: 29 mg/dL — AB (ref 6–23)
CO2: 29 mEq/L (ref 19–32)
CREATININE: 1.03 mg/dL (ref 0.40–1.50)
Calcium: 9.4 mg/dL (ref 8.4–10.5)
Chloride: 104 mEq/L (ref 96–112)
GFR: 74.09 mL/min (ref >60.00)
Glucose, Bld: 99 mg/dL (ref 70–99)
Potassium: 4.6 mEq/L (ref 3.5–5.1)
SODIUM: 139 meq/L (ref 135–145)
Total Bilirubin: 0.8 mg/dL (ref 0.2–1.2)
Total Protein: 6.7 g/dL (ref 6.0–8.3)

## 2015-05-30 LAB — CBC
HCT: 43.4 % (ref 39.0–52.0)
Hemoglobin: 14.4 g/dL (ref 13.0–17.0)
MCHC: 33.3 g/dL (ref 30.0–36.0)
MCV: 88.1 fl (ref 78.0–100.0)
Platelets: 122 10*3/uL — ABNORMAL LOW (ref 150.0–400.0)
RBC: 4.92 Mil/uL (ref 4.22–5.81)
RDW: 13.3 % (ref 11.5–15.5)
WBC: 6.5 10*3/uL (ref 4.0–10.5)

## 2015-05-30 LAB — HEMOGLOBIN A1C: HEMOGLOBIN A1C: 6.1 % (ref 4.6–6.5)

## 2015-06-22 ENCOUNTER — Encounter: Payer: Self-pay | Admitting: Geriatric Medicine

## 2015-06-30 ENCOUNTER — Encounter: Payer: Self-pay | Admitting: Internal Medicine

## 2015-07-14 ENCOUNTER — Telehealth: Payer: Self-pay | Admitting: *Deleted

## 2015-07-14 NOTE — Telephone Encounter (Signed)
No prior authorization necessary for chest ct [facility].

## 2015-07-29 ENCOUNTER — Ambulatory Visit (HOSPITAL_COMMUNITY)
Admission: RE | Admit: 2015-07-29 | Discharge: 2015-07-29 | Disposition: A | Discharge status: Home / Self Care | Payer: Medicare Other | Source: Ambulatory Visit | Attending: Internal Medicine | Admitting: Internal Medicine

## 2015-07-29 DIAGNOSIS — R918 Other nonspecific abnormal finding of lung field: Secondary | ICD-10-CM | POA: Insufficient documentation

## 2015-07-29 DIAGNOSIS — I712 Thoracic aortic aneurysm, without rupture: Secondary | ICD-10-CM | POA: Diagnosis not present

## 2015-08-10 ENCOUNTER — Ambulatory Visit (INDEPENDENT_AMBULATORY_CARE_PROVIDER_SITE_OTHER): Payer: Medicare Other | Admitting: Internal Medicine

## 2015-08-10 ENCOUNTER — Encounter: Payer: Self-pay | Admitting: Internal Medicine

## 2015-08-10 VITALS — BP 144/93 | HR 65 | Temp 97.4°F | Wt 142.8 lb

## 2015-08-10 DIAGNOSIS — I712 Thoracic aortic aneurysm, without rupture, unspecified: Secondary | ICD-10-CM

## 2015-08-10 DIAGNOSIS — A319 Mycobacterial infection, unspecified: Secondary | ICD-10-CM

## 2015-08-12 NOTE — Progress Notes (Signed)
Patient ID: Danny Gross, male   DOB: Jun 04, 1934, 81 y.o.   MRN: [id number]  HPI Danny Gross is a 81yo M with history of bronchiectasis and colonization of m.absessus NTM infection ,that has not been treated but serially monitored. He recently had repeat chest CT on 7/21 that showed ongoing RML infiltrate but not significant worsening of disease process. Some areas of improvement. He does that ectatic ascending thoracic aorta noted on CT last year, measured at 4.0 cm and this year at 4.3cm. Though mentioned that it is unchanged.    He is in his standard good health. Continues to swim, sing, keeps up with his regular exercise routine. He states the has occasional cough but no DOE. He reports that his singing (in quartet) at time is followed by coughing.  No fever, chills, malaise  He is struck by BP measurements in clinic since he routinely checks his BP and it is in normal range most times.  Outpatient Encounter Prescriptions as of 08/10/2015  Medication Sig  . brimonidine-timolol (COMBIGAN) 0.2-0.5 % ophthalmic solution Place 1 drop into both eyes every 12 (twelve) hours.  . Cholecalciferol (VITAMIN D PO) Once every wk  . co-enzyme Q-10 30 MG capsule Take 30 mg by mouth daily.  . dorzolamide (TRUSOPT) 2 % ophthalmic solution Place 1 drop into both eyes 2 (two) times daily.  Marland Kitchen latanoprost (XALATAN) 0.005 % ophthalmic solution Place 1 drop into both eyes at bedtime.  Marland Kitchen acetaZOLAMIDE (DIAMOX) 500 MG capsule Take 500 mg by mouth 2 (two) times a week.   Marland Kitchen guaiFENesin (MUCINEX) 600 MG 12 hr tablet Take 600 mg by mouth 2 (two) times daily as needed.  Marland Kitchen Respiratory Therapy Supplies (FLUTTER) DEVI As directed. (Patient not taking: Reported on 03/03/2015)   No facility-administered encounter medications on file as of 08/10/2015.      Patient Active Problem List   Diagnosis Date Noted  . Impaired fasting glucose 10/04/2014  . Routine general medical examination at a health care facility  12/21/2013  . Pulmonary infiltrates 07/13/2013     Health Maintenance Due  Topic Date Due  . INFLUENZA VACCINE  08/09/2015     Review of Systems  Constitutional: Negative for fever, chills, diaphoresis, activity change, appetite change, fatigue and unexpected weight change.  HENT: Negative for congestion, sore throat, rhinorrhea, sneezing, trouble swallowing and sinus pressure.  Eyes: Negative for photophobia and visual disturbance.  Respiratory: Negative for cough, chest tightness, shortness of breath, wheezing and stridor.  Cardiovascular: Negative for chest pain, palpitations and leg swelling.  Gastrointestinal: Negative for nausea, vomiting, abdominal pain, diarrhea, constipation, blood in stool, abdominal distention and anal bleeding.  Genitourinary: Negative for dysuria, hematuria, flank pain and difficulty urinating.  Musculoskeletal: Negative for myalgias, back pain, joint swelling, arthralgias and gait problem.  Skin: Negative for color change, pallor, rash and wound.  Neurological: Negative for dizziness, tremors, weakness and light-headedness.  Hematological: Negative for adenopathy. Does not bruise/bleed easily.  Psychiatric/Behavioral: Negative for behavioral problems, confusion, sleep disturbance, dysphoric mood, decreased concentration and agitation.    Physical Exam   BP (!) 144/93 (BP Location: Left Arm)   Pulse 65   Temp 97.4 F (36.3 C) (Oral)   Wt 142 lb 12.8 oz (64.8 kg)   BMI 21.09 kg/m  Physical Exam  Constitutional: He is oriented to person, place, and time. He appears well-developed and well-nourished. No distress.  HENT:  Mouth/Throat: Oropharynx is clear and moist. No oropharyngeal exudate.  Cardiovascular: Normal rate, regular rhythm  and normal heart sounds. Exam reveals no gallop and no friction rub.  No murmur heard.  Pulmonary/Chest: Effort normal and breath sounds normal. No respiratory distress. He has no wheezes.  Abdominal: Soft. Bowel  sounds are normal. He exhibits no distension. There is no tenderness.  Lymphadenopathy:  He has no cervical adenopathy.  Neurological: He is alert and oriented to person, place, and time.  Skin: Skin is warm and dry. No rash noted. No erythema.  Psychiatric: He has a normal mood and affect. His behavior is normal.    CBC Lab Results  Component Value Date   WBC 6.5 05/30/2015   RBC 4.92 05/30/2015   HGB 14.4 05/30/2015   HCT 43.4 05/30/2015   PLT 122.0 (L) 05/30/2015   MCV 88.1 05/30/2015   MCH 29.1 01/28/2014   MCHC 33.3 05/30/2015   RDW 13.3 05/30/2015   LYMPHSABS 1.0 01/28/2014   MONOABS 0.5 01/28/2014   EOSABS 0.3 01/28/2014   BASOSABS 0.1 01/28/2014   BMET Lab Results  Component Value Date   NA 139 05/30/2015   K 4.6 05/30/2015   CL 104 05/30/2015   CO2 29 05/30/2015   GLUCOSE 99 05/30/2015   BUN 29 (H) 05/30/2015   CREATININE 1.03 05/30/2015   CALCIUM 9.4 05/30/2015   GFRNONAA 59 (L) 01/28/2014   GFRAA 68 01/28/2014     Assessment and Plan  Pulmonary m.abscessus = at this time, he has very little symptoms for the disease process. They are in agreement to continue to monitor with symptoms and imaging if needed to decide when to pull trigger for treatment. He has drug resistant m.absessus which would need prolonged IV therapy that would preclude him from swimming. And he also high SE profile to med regimen. For now, I feel it is safe to watch and monitor closely  Thoracic ascending aortic aneurysm = we discussed that it would make sense to refer to vascular/CT surgery for feedback on further management.  Health maintenance = recommend enhanced flu vaccine for the Fall  rtc in 6 months

## 2015-08-18 ENCOUNTER — Telehealth: Payer: Self-pay | Admitting: *Deleted

## 2015-08-18 NOTE — Telephone Encounter (Signed)
Per Dr Drue SecondSnider called [facility] to schedule an office visit was advised they work from Epic but not sure the referral is in correctly. Placed in the referral coordinator voicemail to have her call me back to make sure it is in correctly. Will wait for her call.

## 2015-08-30 ENCOUNTER — Other Ambulatory Visit: Payer: Self-pay | Admitting: *Deleted

## 2015-08-30 DIAGNOSIS — I712 Thoracic aortic aneurysm, without rupture, unspecified: Secondary | ICD-10-CM

## 2015-09-05 ENCOUNTER — Institutional Professional Consult (permissible substitution) (INDEPENDENT_AMBULATORY_CARE_PROVIDER_SITE_OTHER): Payer: Medicare Other | Admitting: Cardiothoracic Surgery

## 2015-09-05 VITALS — BP 140/85 | HR 67 | Resp 20 | Ht 69.0 in | Wt 142.0 lb

## 2015-09-05 DIAGNOSIS — I712 Thoracic aortic aneurysm, without rupture, unspecified: Secondary | ICD-10-CM

## 2015-09-05 NOTE — Progress Notes (Signed)
Patient ID: Danny Gross, male   DOB: 1934/11/06, 81 y.o.   MRN: [id number]       [address]       [place] [id number]             [phone]        Danny Gross [facility] Medical Record #[id number] Date of Birth: 1934/11/06  Referring: Judyann MunsonSnider, Cynthia, MD Primary Care: Myrlene BrokerElizabeth A Crawford, MD  Chief Complaint:   Abnormal CT scan of chest  Chief Complaint  Patient presents with  . Thoracic Aortic Aneurysm    Surgical eval, Chest CT 07/29/2015  Patient examined, most recent CT scans of chest personally reviewed and counseled with patient regarding a mild-moderate fusiform aneurysm of the ascending thoracic aorta  History of Present Illness:     81 year old Caucasian male never smoker presents for evaluation of a 4.3 cm fusiform aneurysm of the ascending thoracic aorta noted as an incidental finding for examination of atypical mycobacterial disease of his right lung. The patient has been carefully followed by Dr. 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 This has been stable since 2015. The patient states a decision has been made not to treat the infection at this time. The patient underwent bronchoscopy in 2015 by Dr. Sherene SiresWert.  The patient's fusiform ascending aortic dilatation is also been stable since 2015. There is no evidence of mural thrombus or ulceration. There is no family history of aortic dissection or sudden death. There is no family history of thoracic or abdominal aortic aneurysm disease. The patient denies history of hypertension although his blood pressure determinations have been borderline elevated at physician's offices. The patient follows a heart healthy lifestyle with diet and exercise.   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 His weight has been stable    Zubrod Score: At the time of surgery this patient's most appropriate activity status/level should be described as: []     0    Normal activity, no symptoms [x]     1    Restricted in physical strenuous activity but ambulatory, able to do out light work []     2    Ambulatory and capable of self care, unable to do work activities, up and about                 more than 50%  Of the time                            []     3    Only limited self care, in bed greater than 50% of waking hours []     4    Completely disabled, no self care, confined to bed or chair []     5    Moribund  Past Medical History:  Diagnosis Date  . Cataract   . Emphysema of lung [facility](HCC)     Past Surgical History:  Procedure Laterality Date  . HERNIA REPAIR  2003  . TONSILLECTOMY  1939  . VIDEO BRONCHOSCOPY Bilateral 08/11/2013   Procedure: VIDEO BRONCHOSCOPY WITH FLUORO;  Surgeon: Nyoka CowdenMichael B Wert, MD;  Location: [facility];  Service: Cardiopulmonary;  Laterality: Bilateral;    History  Smoking Status  . Never Smoker  Smokeless Tobacco  .  Never Used   History  Alcohol Use  . 0.0 oz/week    Comment: cocktail daily    Social History   Social History  . Marital status: Married    Spouse name: N/A  . Number of children: N/A  . Years of education: N/A   Occupational History  . Retired    Social History Main Topics  . Smoking status: Never Smoker  . Smokeless tobacco: Never Used  . Alcohol use 0.0 oz/week     Comment: cocktail daily  . Drug use: No  . Sexual activity: Not on file   Other Topics Concern  . Not on file   Social History Narrative  . No narrative on file    No Known Allergies  Current Outpatient Prescriptions  Medication Sig Dispense Refill  . brimonidine-timolol (COMBIGAN) 0.2-0.5 % ophthalmic solution Place 1 drop into both eyes every 12 (twelve) hours.    . Cholecalciferol (VITAMIN D PO) Once every wk    . co-enzyme Q-10 30 MG capsule  Take 30 mg by mouth daily.    . dorzolamide (TRUSOPT) 2 % ophthalmic solution Place 1 drop into both eyes 2 (two) times daily.    Marland Kitchen guaiFENesin (MUCINEX) 600 MG 12 hr tablet Take 600 mg by mouth 2 (two) times daily as needed.    . latanoprost (XALATAN) 0.005 % ophthalmic solution Place 1 drop into both eyes at bedtime.    Marland Kitchen Respiratory Therapy Supplies (FLUTTER) DEVI As directed. 1 each 0   No current facility-administered medications for this visit.      (Not in a hospital admission)  Family History  Problem Relation Age of Onset  . Cancer Maternal Aunt     ? type      Review of Systems:       Cardiac Review of Systems: Y or N  Chest Pain [  No  ]  Resting SOB [ no ] Exertional SOB  [ no ]  Orthopnea [no  ]   Pedal Edema [  no ]    Palpitations [no  ] Syncope  [ no ]   Presyncope [no   ]  General Review of Systems: [Y] = yes [  ]=no Constitional: recent weight change [  ]; anorexia [  ]; fatigue [  ]; nausea [  ]; night sweats [  ]; fever [  ]; or chills [  ]                                                               Dental: poor dentition[  ]; Last Dentist visit:Every 6 months   Eye : blurred vision [  ]; diplopia [   ]; vision changes [  ];  Amaurosis fugax[  ];Previous cataract surgery  Resp: cough [yes-chronic  ];  wheezing[  ];  hemoptysis[  ]; shortness of breath[  ]; paroxysmal nocturnal dyspnea[  ]; dyspnea on exertion[  ]; or orthopnea[  ];  GI:  gallstones[  ], vomiting[  ];  dysphagia[  ]; melena[  ];  hematochezia [  ]; heartburn[  ];   Hx of  Colonoscopy[ yes negative ]; GU: kidney stones [  ]; hematuria[  ];   dysuria [  ];  nocturia[  ];  history of  obstruction [  ]; urinary frequency [  ]             Skin: rash, swelling[  ];, hair loss[  ];  peripheral edema[  ];  or itching[  ]; Musculosketetal: myalgias[  ];  joint swelling[  ];  joint erythema[  ];  joint pain[  ];  back pain[  ];  Heme/Lymph: bruising[ yes ];  bleeding[  ];  anemia[  ];  Neuro: TIA[   ];  headaches[  ];  stroke[  ];  vertigo[  ];  seizures[  ];   paresthesias[  ];  difficulty walking[  ];  Psych:depression[  ]; anxiety[  ];  Endocrine: diabetes[  ];  thyroid dysfunction[  ];  Immunizations: Flu [ every year ]; Pneumococcal[  ];  Other:No history thoracic trauma  Physical Exam: BP 140/85   Pulse 67   Resp 20   Ht 5\' 9"  (1.753 m)   Wt 142 lb (64.4 kg)   SpO2 98% Comment: RA  BMI 20.97 kg/m        Physical Exam  General: 81 year old Caucasian male no acute distress accompanied by wife HEENT: Normocephalic pupils equal , dentition adequate Neck: Supple without JVD, adenopathy, or bruit Chest: Clear to auscultation, symmetrical breath sounds, no rhonchi, no tenderness             or deformity Cardiovascular: Regular rate and rhythm, no murmur, no gallop, peripheral pulses             palpable in all extremities Abdomen:  Soft, nontender, no palpable mass or organomegaly Extremities: Warm, well-perfused, no clubbing cyanosis edema or tenderness, positive varicose veins of the lower extremities but without ulceration or venous stasis              Rectal/GU: Deferred Neuro: Grossly non--focal and symmetrical throughout Skin: Clean and dry without rash or ulceration   Diagnostic Studies & Laboratory data:     Recent Radiology Findings:   No results found.   I have independently reviewed the above radiologic studies.  Recent Lab Findings: Lab Results  Component Value Date   WBC 6.5 05/30/2015   HGB 14.4 05/30/2015   HCT 43.4 05/30/2015   PLT 122.0 (L) 05/30/2015   GLUCOSE 99 05/30/2015   CHOL 201 (H) 05/30/2015   TRIG 87.0 05/30/2015   HDL 50.00 05/30/2015   LDLCALC 133 (H) 05/30/2015   ALT 14 05/30/2015   AST 18 05/30/2015   NA 139 05/30/2015   K 4.6 05/30/2015   CL 104 05/30/2015   CREATININE 1.03 05/30/2015   BUN 29 (H) 05/30/2015   CO2 29 05/30/2015   TSH 1.29 07/27/2013   INR 1.1 (H) 07/27/2013   HGBA1C 6.1 05/30/2015      Assessment  / Plan:     4.3 cm fusiform dilatation of the ascending aorta, asymptomatic. This is low risk for aortic dissection. The aorta should be followed with annual surveillance scans which will be set up with the patient. Patient understands he should avoid heavy weight training but his current exercise program with dumbbells 10-15 pounds is safe.  The patient's chronic lung disease is followed by his ID consult from Dr. Ilsa Iha.  I will plan on seeing the patient back a year and will coordinate CT scans of the chest with his imaging for his pulmonary disease in order not to duplicate scans.  The patient understands that surgical repair of the ascending aorta is not indicated in an asymptomatic patient  unless  the diameter is 5.5 cm.  @ME1 @ 09/05/2015 4:46 PM

## 2015-10-27 ENCOUNTER — Encounter: Payer: Self-pay | Admitting: Geriatric Medicine

## 2016-01-12 ENCOUNTER — Ambulatory Visit: Payer: Medicare Other | Admitting: Internal Medicine

## 2016-01-19 ENCOUNTER — Other Ambulatory Visit: Payer: Medicare Other

## 2016-02-02 ENCOUNTER — Ambulatory Visit: Payer: Medicare Other | Admitting: Internal Medicine

## 2016-05-09 ENCOUNTER — Encounter: Payer: Self-pay | Admitting: Internal Medicine

## 2016-05-09 ENCOUNTER — Ambulatory Visit (INDEPENDENT_AMBULATORY_CARE_PROVIDER_SITE_OTHER): Payer: Medicare Other | Admitting: Internal Medicine

## 2016-05-09 VITALS — BP 163/82 | HR 61 | Temp 97.8°F | Ht 67.0 in | Wt 144.0 lb

## 2016-05-09 DIAGNOSIS — A310 Pulmonary mycobacterial infection: Secondary | ICD-10-CM

## 2016-05-09 LAB — BASIC METABOLIC PANEL
BUN: 25 mg/dL (ref 7–25)
CO2: 27 mmol/L (ref 20–31)
Calcium: 9 mg/dL (ref 8.6–10.3)
Chloride: 104 mmol/L (ref 98–110)
Creat: 1.19 mg/dL — ABNORMAL HIGH (ref 0.70–1.18)
Glucose, Bld: 72 mg/dL (ref 65–99)
Potassium: 4.7 mmol/L (ref 3.5–5.3)
Sodium: 139 mmol/L (ref 135–146)

## 2016-05-09 NOTE — Progress Notes (Signed)
RFV: follow up for m.abscessus Patient ID: Danny Gross, male   DOB: 11/08/1934, 82 y.o.   MRN: [id number]  HPI 82yo M with history of bronchiectasis and colonization with pulmonary m.abscessus. He has been monitored for any worsening symptom to consider treatment for his NTM. He is doing well. No decreased change in physical activity. Has intermittent productive cough but no DOE. He continues to sing in choir 4 times per week, swims, and walks every other day roughly a few miles. No changes in his weight. Does report that he needs roughly 9-10 hr of sleep per day.   I have reviewed his chart in epic since last visit. He does have an asymptomatic ascending aorta dilatation.  Outpatient Encounter Prescriptions as of 05/09/2016  Medication Sig  . brimonidine-timolol (COMBIGAN) 0.2-0.5 % ophthalmic solution Place 1 drop into both eyes every 12 (twelve) hours.  Marland Kitchen co-enzyme Q-10 30 MG capsule Take 30 mg by mouth daily.  . dorzolamide (TRUSOPT) 2 % ophthalmic solution Place 1 drop into both eyes 2 (two) times daily.  Marland Kitchen guaiFENesin (MUCINEX) 600 MG 12 hr tablet Take 600 mg by mouth 2 (two) times daily as needed.  . latanoprost (XALATAN) 0.005 % ophthalmic solution Place 1 drop into both eyes at bedtime.  . Cholecalciferol (VITAMIN D PO) Once every wk  . Respiratory Therapy Supplies (FLUTTER) DEVI As directed. (Patient not taking: Reported on 05/09/2016)   No facility-administered encounter medications on file as of 05/09/2016.      Patient Active Problem List   Diagnosis Date Noted  . Impaired fasting glucose 10/04/2014  . Routine general medical examination at a health care facility 12/21/2013  . Pulmonary infiltrates 07/13/2013   Social History  Substance Use Topics  . Smoking status: Never Smoker  . Smokeless tobacco: Never Used  . Alcohol use 0.0 oz/week     Comment: cocktail daily    There are no preventive care reminders to display for this patient.   Review of Systems Review  of Systems  Constitutional: Negative for fever, chills, diaphoresis, activity change, appetite change, fatigue and unexpected weight change.  HENT: Negative for congestion, sore throat, rhinorrhea, sneezing, trouble swallowing and sinus pressure.  Eyes: Negative for photophobia and visual disturbance.  Respiratory: Negative for cough, chest tightness, shortness of breath, wheezing and stridor.  Cardiovascular: Negative for chest pain, palpitations and leg swelling.  Gastrointestinal: Negative for nausea, vomiting, abdominal pain, diarrhea, constipation, blood in stool, abdominal distention and anal bleeding.  Genitourinary: Negative for dysuria, hematuria, flank pain and difficulty urinating.  Musculoskeletal: Negative for myalgias, back pain, joint swelling, arthralgias and gait problem.  Skin: Negative for color change, pallor, rash and wound.  Neurological: Negative for dizziness, tremors, weakness and light-headedness.  Hematological: Negative for adenopathy. Does not bruise/bleed easily.  Psychiatric/Behavioral: Negative for behavioral problems, confusion, sleep disturbance, dysphoric mood, decreased concentration and agitation.    Physical Exam   BP (!) 163/82   Pulse 61   Temp 97.8 F (36.6 C) (Oral)   Ht  (1.702 m)   Wt 144 lb (65.3 kg)   BMI 22.55 kg/m    Physical Exam  Constitutional: He is oriented to person, place, and time. He appears well-developed and well-nourished. No distress.  HENT:  Mouth/Throat: Oropharynx is clear and moist. No oropharyngeal exudate.  Cardiovascular: Normal rate, regular rhythm and normal heart sounds. Exam reveals no gallop and no friction rub.  No murmur heard.  Pulmonary/Chest: Effort normal and breath sounds normal. No respiratory distress.  He has no wheezes.  Abdominal: Soft. Bowel sounds are normal. He exhibits no distension. There is no tenderness.  Lymphadenopathy:  He has no cervical adenopathy.  Neurological: He is alert and  oriented to person, place, and time.  Skin: Skin is warm and dry. No rash noted. No erythema.  Psychiatric: He has a normal mood and affect. His behavior is normal.    CBC Lab Results  Component Value Date   WBC 6.5 05/30/2015   RBC 4.92 05/30/2015   HGB 14.4 05/30/2015   HCT 43.4 05/30/2015   PLT 122.0 (L) 05/30/2015   MCV 88.1 05/30/2015   MCH 29.1 01/28/2014   MCHC 33.3 05/30/2015   RDW 13.3 05/30/2015   LYMPHSABS 1.0 01/28/2014   MONOABS 0.5 01/28/2014   EOSABS 0.3 01/28/2014    BMET Lab Results  Component Value Date   NA 139 05/30/2015   K 4.6 05/30/2015   CL 104 05/30/2015   CO2 29 05/30/2015   GLUCOSE 99 05/30/2015   BUN 29 (H) 05/30/2015   CREATININE 1.03 05/30/2015   CALCIUM 9.4 05/30/2015   GFRNONAA 59 (L) 01/28/2014   GFRAA 68 01/28/2014    Assessment and Plan  Pulmonary m.abscessus = appears to still have very little symptoms. Will plant to repeat imaging likely this summer in July/august to coincide with thoracic aortic aneurysm monitoring. For now he does not have symptoms to suggest we need to urgently treat pulmonary NTM infection/colonization. Await to see if results are significantly different on imaging.

## 2016-06-12 ENCOUNTER — Encounter: Payer: Self-pay | Admitting: Internal Medicine

## 2016-06-12 NOTE — Telephone Encounter (Signed)
Danny Gross, is this something you are working on from 5/2?

## 2016-06-13 ENCOUNTER — Telehealth: Payer: Self-pay

## 2016-06-19 ENCOUNTER — Encounter (HOSPITAL_COMMUNITY): Payer: Self-pay

## 2016-06-19 ENCOUNTER — Ambulatory Visit (HOSPITAL_COMMUNITY)
Admission: RE | Admit: 2016-06-19 | Discharge: 2016-06-19 | Disposition: A | Discharge status: Home / Self Care | Payer: Medicare Other | Source: Ambulatory Visit | Attending: Internal Medicine | Admitting: Internal Medicine

## 2016-06-19 DIAGNOSIS — I700 Atherosclerosis of aorta: Secondary | ICD-10-CM | POA: Diagnosis not present

## 2016-06-19 DIAGNOSIS — I719 Aortic aneurysm of unspecified site, without rupture: Secondary | ICD-10-CM | POA: Insufficient documentation

## 2016-06-19 DIAGNOSIS — R918 Other nonspecific abnormal finding of lung field: Secondary | ICD-10-CM | POA: Diagnosis not present

## 2016-06-19 DIAGNOSIS — A310 Pulmonary mycobacterial infection: Secondary | ICD-10-CM | POA: Insufficient documentation

## 2016-06-26 NOTE — Telephone Encounter (Signed)
Error

## 2016-08-07 ENCOUNTER — Telehealth: Payer: Medicare Other | Admitting: Nurse Practitioner

## 2016-08-07 ENCOUNTER — Encounter: Payer: Self-pay | Admitting: Internal Medicine

## 2016-08-07 DIAGNOSIS — A310 Pulmonary mycobacterial infection: Secondary | ICD-10-CM

## 2016-08-07 DIAGNOSIS — H9203 Otalgia, bilateral: Secondary | ICD-10-CM

## 2016-08-07 NOTE — Progress Notes (Signed)
3 more attempts were made to contact patient from [phone]. Was still not able to reach patient for further clarification. Evisit closed with no charge to the patient

## 2016-08-07 NOTE — Progress Notes (Signed)
MI requires antibiotic treatment and I did not see any antibiotics listed on med list. I attempted to contact patient by phone and had to leave a message. Sent message through e visit an dwill wait on patients response to determine treatment.

## 2016-08-09 IMAGING — CT CT CHEST W/O CM
2 of 3 series · 15 of 36 positions shown, 18 images · non-contrast
Comparison: 08/19/2014, 02/02/2014.

CLINICAL DATA: Followup right middle lobe abnormalities which may
reflect mycobacterium avium intracellulare.

EXAM:
CT CHEST WITHOUT CONTRAST
TECHNIQUE: Multidetector CT imaging of the chest was performed following the
standard protocol without IV contrast.

[Series 2: chest w/o 2mm st · axial · non-contrast · 0.80mm/px · z∈[-533,-225]mm · 12 of 182 slices shown, 15 images]
[im 14/182  mediastinal]
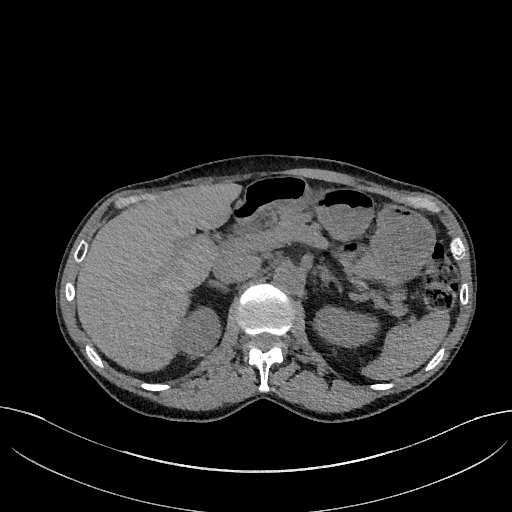
[im 14/182  lung]
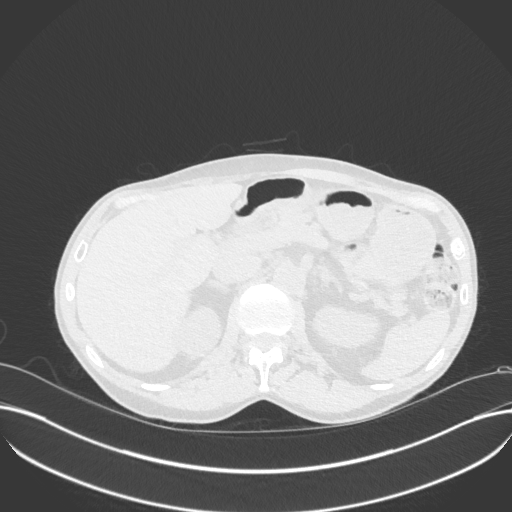
[im 27/182  lung]
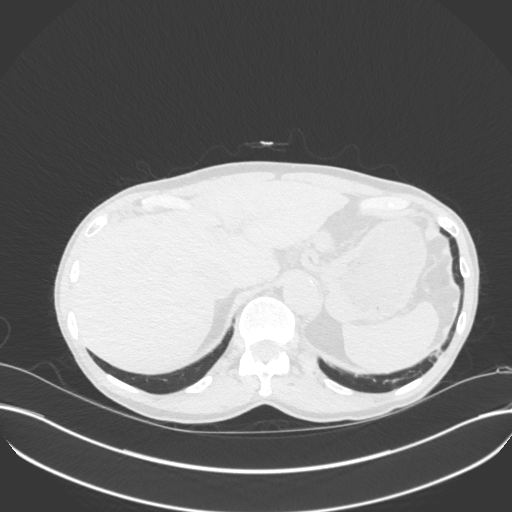
[im 41/182  lung]
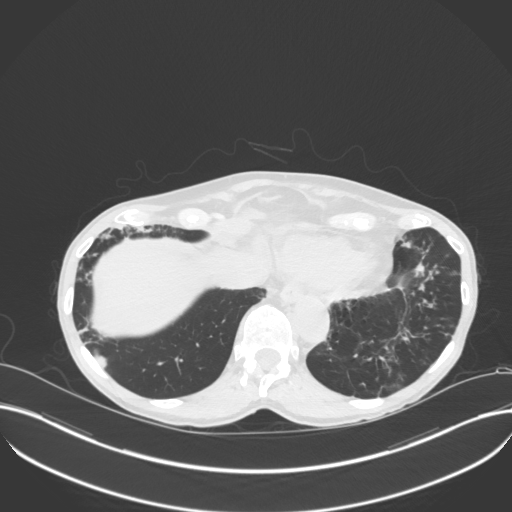
[im 54/182  lung]
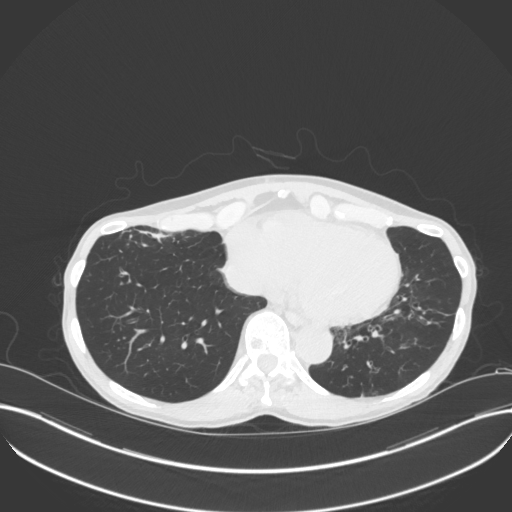
[im 68/182  mediastinal]
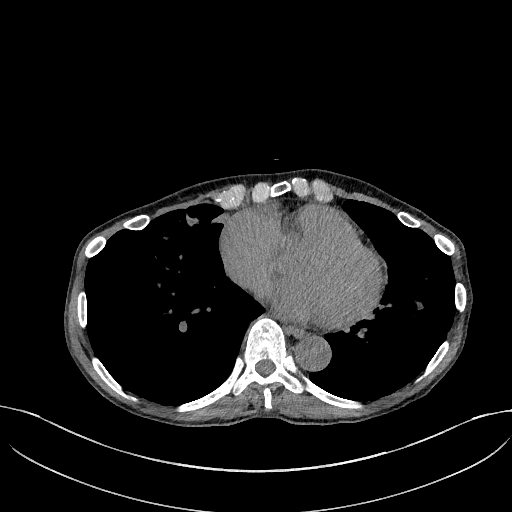
[im 68/182  lung]
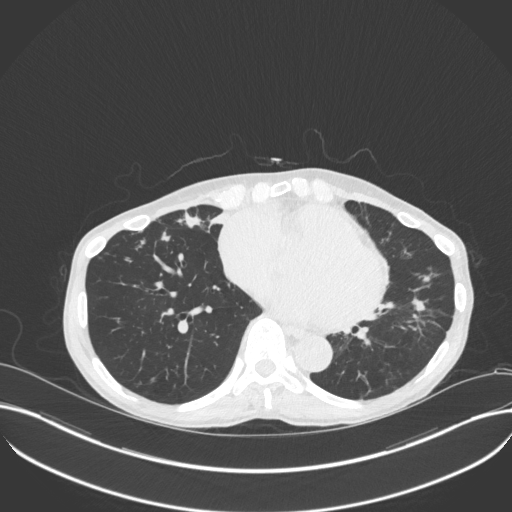
[im 81/182  lung]
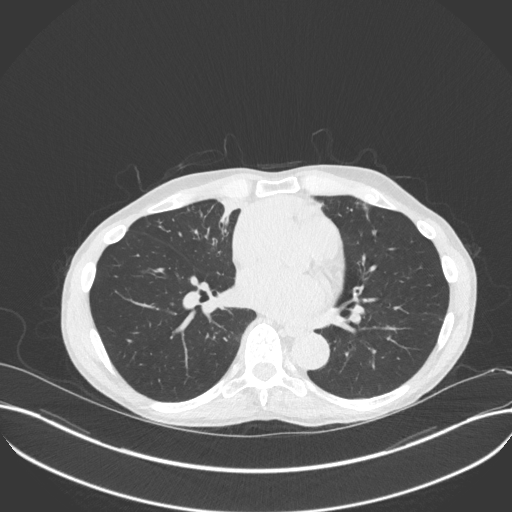
[im 101/182  lung]
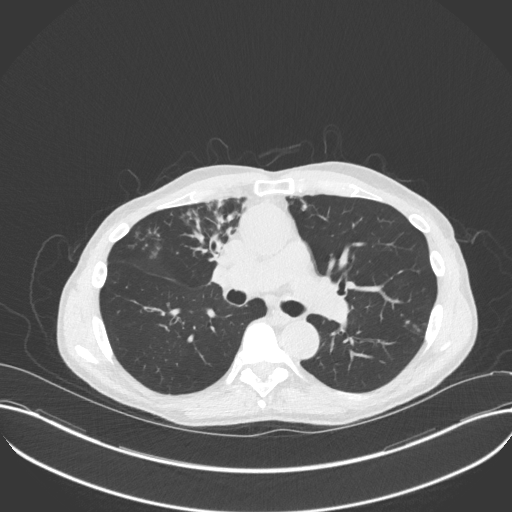
[im 114/182  lung]
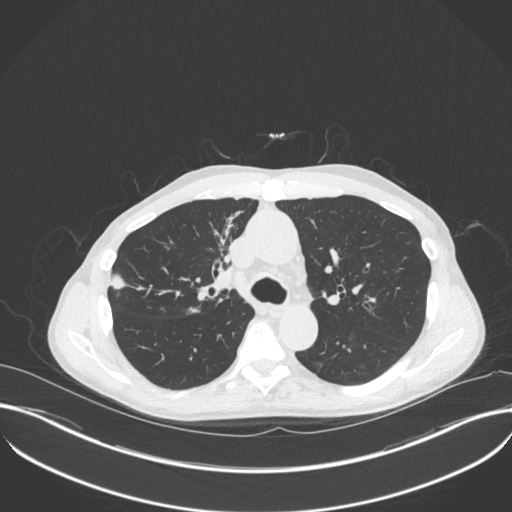
[im 128/182  mediastinal]
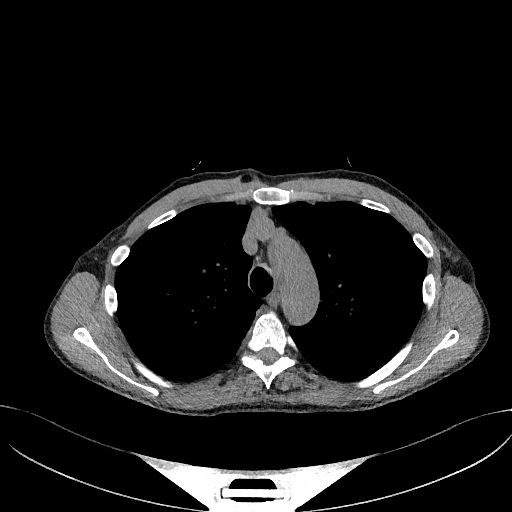
[im 128/182  lung]
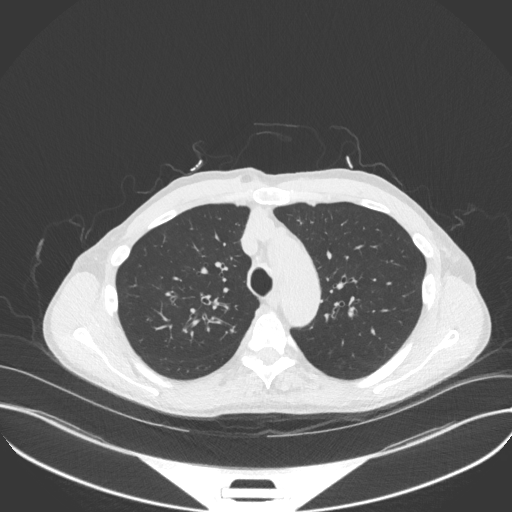
[im 141/182  lung]
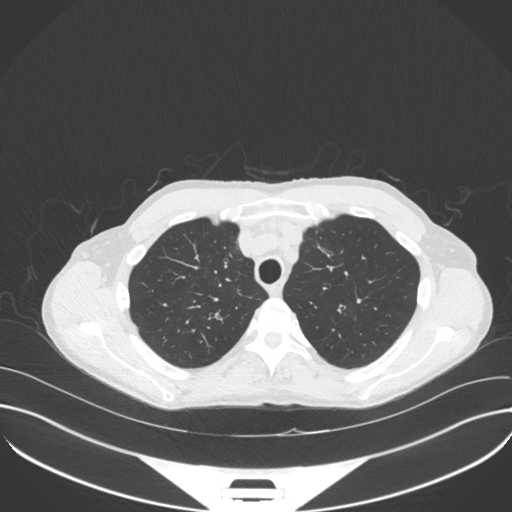
[im 155/182  lung]
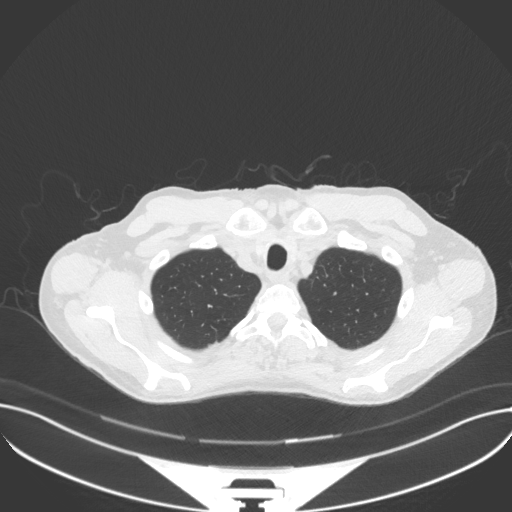
[im 168/182  lung]
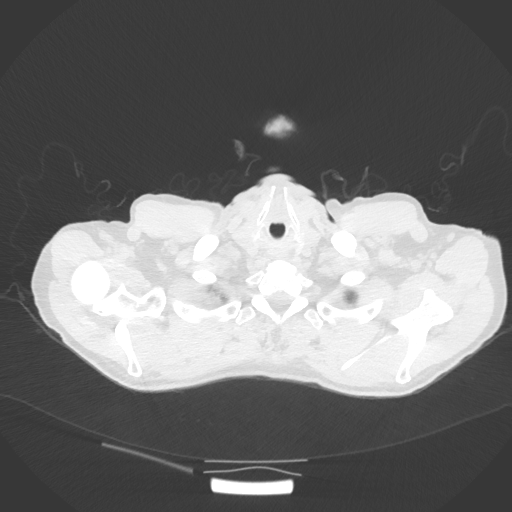

[Series 4: chest w/o 3mm st cor · coronal · non-contrast · 0.71mm/px · 3 of 66 slices shown]
[im 14/66  lung]
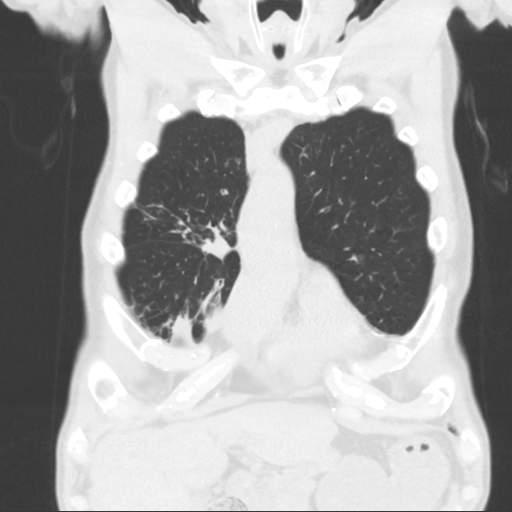
[im 27/66  lung]
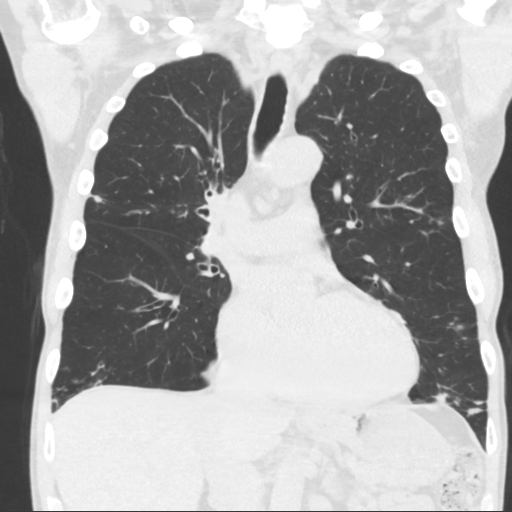
[im 40/66  lung]
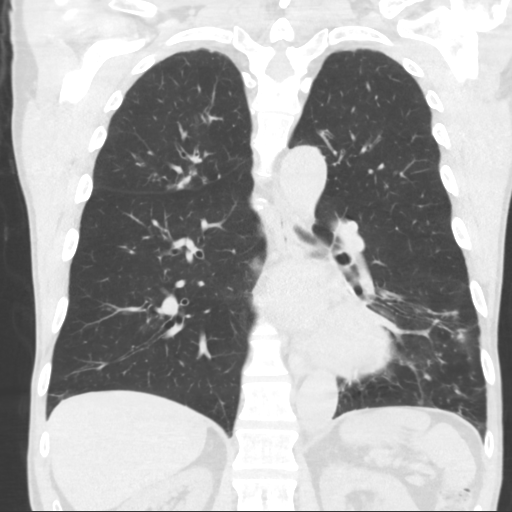

[15 of 36 positions shown; findings below may reference images not displayed]

FINDINGS: Cardiovascular: Mild-to-moderate cardiomegaly with left ventricular
predominance. No visible coronary atherosclerosis. No pericardial
effusion. Aortic annular calcification. Ectatic ascending thoracic
aorta with maximum diameter approximately 4.3 cm, unchanged. Minimal
to mild atherosclerosis involving the thoracic and upper abdominal
aorta without aneurysm.

Mediastinum/Nodes: No pathologically enlarged mediastinal, hilar or
axillary lymph nodes. No mediastinal masses. Normal-appearing
esophagus. Thyroid gland unremarkable.

Lungs/Pleura: Conglomerate opacity in the medial segment of the
right middle lobe measuring approximately 3.5 x 2.7 cm, unchanged
dating back to January 2014. Peribronchiolar nodularity elsewhere
in the right middle lobe, unchanged. A more conglomerate opacity is
present in the lateral segment right middle lobe which is a new
finding since the prior examinations. A conglomerate nodular opacity
in the right middle lobe adjacent to the minor fissure on the prior
examination has resolved. Minimal peripheral nodularity involving
the right lower lobe, similar to previous. Interval improvement in
the nodularity in the left lower lobe. Stable mild cylindrical
bronchiectasis involving the left lower lobe.

Upper Abdomen: Stable approximate 4.6 x 3.8 cm simple cyst arising
from the upper pole of the left kidney. No acute or significant
abnormalities.

Musculoskeletal: Degenerative disc disease and spondylosis at C6-7.
Mild degenerative disc disease and spondylosis involving the mid
thoracic spine. No acute abnormalities.
IMPRESSION: 1. Stable conglomerate opacity involving the medial segment of the
right middle lobe dating back to January 2014.
2. Waxing and waning conglomerate nodules involving the right middle
lobe, with stable peribronchiolar nodularity in the right middle
lobe and minimally in the lower lobes.
3. Above findings again would be consistent with mycobacterium avium
intracellulare infection.
4. Ectatic ascending thoracic aorta measuring up to 4.3 cm.
Recommend annual imaging followup by CTA or MRA. This recommendation
follows 7222 ACCF/AHA/AATS/ACR/ASA/SCA/PILLA/SITO/SVI ZA/NAW Guidelines
for the Diagnosis and Management of Patients with Thoracic Aortic
Disease. Circulation. 7222; 121: [id number]

## 2016-08-25 ENCOUNTER — Encounter: Payer: Self-pay | Admitting: Family Medicine

## 2016-08-25 ENCOUNTER — Ambulatory Visit (INDEPENDENT_AMBULATORY_CARE_PROVIDER_SITE_OTHER): Payer: Medicare Other | Admitting: Family Medicine

## 2016-08-25 VITALS — BP 152/90 | HR 60 | Temp 98.0°F | Wt 140.8 lb

## 2016-08-25 DIAGNOSIS — H6121 Impacted cerumen, right ear: Secondary | ICD-10-CM

## 2016-08-25 NOTE — Patient Instructions (Signed)
Follow up as needed Saturate a cotton ball w/ hydrogen peroxide and then drip it into the ear 2-3x/week to prevent ear wax build up Call with any questions or concerns Have a great weekend!! Happy [birth year] Birthday!!

## 2016-08-25 NOTE — Progress Notes (Signed)
   Subjective:    Patient ID: Danny Gross, male    DOB: 1934/08/06, 82 y.o.   MRN: [id number]  HPI Ear pain- pt reports he is seeing Dr Drue Second for 'lung infxn' since 2015.  Pt reports R ear fullness and decreased hearing.  sxs started 3-4 months ago but has worsened in the last week.  No fevers.  No drainage from ear.  Not currently on allergy medication.  Denies nasal congestion.  Elevated BP- pt reports he takes his BP at home and BP is 120s/70s.  States that whenever he comes to a dr's office 'it's 20 points higher'.   Review of Systems For ROS see HPI     Objective:   Physical Exam  Constitutional: He is oriented to person, place, and time. He appears well-developed. No distress.  Thin  HENT:  Head: Normocephalic and atraumatic.  Nose: Nose normal.  Mouth/Throat: Oropharynx is clear and moist. No oropharyngeal exudate.  + PND Cerumen in L ear but able to see around this to retracted TM R TM obscured by cerumen- unable to remove w/ curette, CMA irrigated using peroxide.  After wax was softened, able to removed w/ curette  Neck: Normal range of motion. Neck supple.  Pulmonary/Chest: Effort normal and breath sounds normal. No respiratory distress. He has no wheezes. He has no rales.  Lymphadenopathy:    He has no cervical adenopathy.  Neurological: He is alert and oriented to person, place, and time.  Psychiatric: He has a normal mood and affect. His behavior is normal. Thought content normal.  Vitals reviewed.         Assessment & Plan:  Cerumen impaction- after successful removal of a large clump of wax, pt's hearing improved.  There is still some residual wax against the TM but for fear of perforation, did not want to further irrigate or curette.  Pt was instructed to use H2O2 to dissolve the wax and prevent build up.  Pt expressed understanding and is in agreement w/ plan.

## 2016-09-05 ENCOUNTER — Ambulatory Visit: Payer: Medicare Other | Admitting: Cardiothoracic Surgery

## 2016-09-12 ENCOUNTER — Encounter: Payer: Self-pay | Admitting: Cardiothoracic Surgery

## 2016-09-12 ENCOUNTER — Ambulatory Visit (INDEPENDENT_AMBULATORY_CARE_PROVIDER_SITE_OTHER): Payer: Medicare Other | Admitting: Cardiothoracic Surgery

## 2016-09-12 VITALS — BP 140/81 | HR 56 | Resp 16 | Ht 69.0 in | Wt 142.0 lb

## 2016-09-12 DIAGNOSIS — I712 Thoracic aortic aneurysm, without rupture, unspecified: Secondary | ICD-10-CM

## 2016-09-12 NOTE — Progress Notes (Signed)
PCP is Myrlene Brokerrawford, Elizabeth A, MD Referring Provider is Judyann MunsonSnider, Cynthia, MD  Chief Complaint  Patient presents with  . TAA    1 yr f/u with CT CHEST 06/19/16   Return visit with CT scan of chest for a symptomatic 4.2 cm fusiform ascending aneurysm HPI: Patient has a known mild-moderate fusiform ascending aneurysm measuring proximal a 4.2 cm since 2015. He returns with a CT scan of the chest which was performed June 2018. Compared to the study in 2017 there is been no significant change in the thoracic aortic diameter. There is no worrisome characteristics of mural thickening, ulceration, or edema. His risk of aortic dissection is extremely low at the current size of his aorta and with his normal blood pressure  The patient has MAI of both lungs with bronchiolitis followed by the infectious disease clinic-Dr. Ilsa IhaSnyder. The images from earlier this summer show mild changes in both lungs but the patient denies any change in symptoms. He states he will follow up with Dr. Ilsa IhaSnyder in the next 4 weeks. He is currently not on antibiotic therapy for MAI because of his mild clinical symptoms.  Past Medical History:  Diagnosis Date  . Cataract   . Emphysema of lung [facility](HCC)     Past Surgical History:  Procedure Laterality Date  . HERNIA REPAIR  2003  . TONSILLECTOMY  1939  . VIDEO BRONCHOSCOPY Bilateral 08/11/2013   Procedure: VIDEO BRONCHOSCOPY WITH FLUORO;  Surgeon: Nyoka CowdenMichael B Wert, MD;  Location: [facility];  Service: Cardiopulmonary;  Laterality: Bilateral;    Family History  Problem Relation Age of Onset  . Cancer Maternal Aunt        ? type     Social History Social History  Substance Use Topics  . Smoking status: Never Smoker  . Smokeless tobacco: Never Used  . Alcohol use 0.0 oz/week     Comment: cocktail daily    Current Outpatient Prescriptions  Medication Sig Dispense Refill  . brimonidine-timolol (COMBIGAN) 0.2-0.5 % ophthalmic solution Place 1 drop into both eyes every 12  (twelve) hours.    . Cholecalciferol (VITAMIN D PO) Once every wk    . co-enzyme Q-10 30 MG capsule Take 30 mg by mouth daily.    . dorzolamide (TRUSOPT) 2 % ophthalmic solution Place 1 drop into both eyes 2 (two) times daily.    Marland Kitchen. guaiFENesin (MUCINEX) 600 MG 12 hr tablet Take 600 mg by mouth 2 (two) times daily as needed.    . latanoprost (XALATAN) 0.005 % ophthalmic solution Place 1 drop into both eyes at bedtime.     No current facility-administered medications for this visit.     No Known Allergies  Review of Systems  Weight stable Walks approximately 30-40 minutes daily No fever No hospitalizations No surgery since last visit No chest pain No ankle edema  BP 140/81 (BP Location: Right Arm, Patient Position: Sitting, Cuff Size: Large)   Pulse (!) 56   Resp 16   Ht 5\' 9"  (1.753 m)   Wt 142 lb (64.4 kg)   SpO2 95% Comment: ON RA  BMI 20.97 kg/m  Physical Exam      Exam    General- alert and comfortable   Lungs- clear without rales, wheezes   Cor- regular rate and rhythm, no murmur , gallop   Abdomen- soft, non-tender   Extremities - warm, non-tender, minimal edema   Neuro- oriented, appropriate, no focal weakness   Diagnostic Tests: CTA of the thoracic aorta shows no change in  mild 4.2 cm fusiform ascending aneurysm.  Impression: No surgical therapy is indicated or recommended by guidelines unless diameter reaches 5.5 cm. Best therapy is surveillance scans and blood pressure control  Plan: Since the thoracic aorta has not changed in 3 years we will increase his surveillance scan interval out to 24 months. He'll return in summer 2020 with CT scan.  Mikey Bussing, MD [facility] [phone]

## 2016-09-24 ENCOUNTER — Ambulatory Visit (INDEPENDENT_AMBULATORY_CARE_PROVIDER_SITE_OTHER): Payer: Medicare Other | Admitting: Internal Medicine

## 2016-09-24 ENCOUNTER — Encounter: Payer: Self-pay | Admitting: Internal Medicine

## 2016-09-24 ENCOUNTER — Other Ambulatory Visit (INDEPENDENT_AMBULATORY_CARE_PROVIDER_SITE_OTHER): Payer: Medicare Other

## 2016-09-24 VITALS — BP 118/80 | HR 57 | Temp 98.6°F | Ht 69.0 in | Wt 141.0 lb

## 2016-09-24 DIAGNOSIS — R7301 Impaired fasting glucose: Secondary | ICD-10-CM

## 2016-09-24 DIAGNOSIS — Z0000 Encounter for general adult medical examination without abnormal findings: Secondary | ICD-10-CM | POA: Diagnosis not present

## 2016-09-24 LAB — COMPREHENSIVE METABOLIC PANEL
ALBUMIN: 4.1 g/dL (ref 3.5–5.2)
ALT: 16 U/L (ref 0–53)
AST: 18 U/L (ref 0–37)
Alkaline Phosphatase: 95 U/L (ref 39–117)
BILIRUBIN TOTAL: 0.8 mg/dL (ref 0.2–1.2)
BUN: 24 mg/dL — AB (ref 6–23)
CHLORIDE: 103 meq/L (ref 96–112)
CO2: 29 mEq/L (ref 19–32)
CREATININE: 1.2 mg/dL (ref 0.40–1.50)
Calcium: 9.6 mg/dL (ref 8.4–10.5)
GFR: 61.9 mL/min (ref >60.00)
Glucose, Bld: 123 mg/dL — ABNORMAL HIGH (ref 70–99)
Potassium: 4.7 mEq/L (ref 3.5–5.1)
Sodium: 138 mEq/L (ref 135–145)
TOTAL PROTEIN: 7.2 g/dL (ref 6.0–8.3)

## 2016-09-24 LAB — LIPID PANEL
CHOLESTEROL: 197 mg/dL (ref 0–200)
HDL: 68.1 mg/dL (ref >39.00)
LDL Cholesterol: 113 mg/dL — ABNORMAL HIGH (ref 0–99)
NonHDL: 129.34
TRIGLYCERIDES: 80 mg/dL (ref 0.0–149.0)
Total CHOL/HDL Ratio: 3
VLDL: 16 mg/dL (ref 0.0–40.0)

## 2016-09-24 LAB — CBC
HEMATOCRIT: 43.9 % (ref 39.0–52.0)
Hemoglobin: 14.1 g/dL (ref 13.0–17.0)
MCHC: 32.2 g/dL (ref 30.0–36.0)
MCV: 91 fl (ref 78.0–100.0)
Platelets: 122 10*3/uL — ABNORMAL LOW (ref 150.0–400.0)
RBC: 4.83 Mil/uL (ref 4.22–5.81)
RDW: 13.6 % (ref 11.5–15.5)
WBC: 6.1 10*3/uL (ref 4.0–10.5)

## 2016-09-24 LAB — HEMOGLOBIN A1C: Hgb A1c MFr Bld: 6.1 % (ref 4.6–6.5)

## 2016-09-24 NOTE — Assessment & Plan Note (Signed)
Checking HgA1c and adjust as needed.  

## 2016-09-24 NOTE — Patient Instructions (Signed)
We will check the labs today and call you back with the results.    Health Maintenance, Male A healthy lifestyle and preventive care is important for your health and wellness. Ask your health care provider about what schedule of regular examinations is right for you. What should I know about weight and diet? Eat a Healthy Diet  Eat plenty of vegetables, fruits, whole grains, low-fat dairy products, and lean protein.  Do not eat a lot of foods high in solid fats, added sugars, or salt.  Maintain a Healthy Weight Regular exercise can help you achieve or maintain a healthy weight. You should:  Do at least 150 minutes of exercise each week. The exercise should increase your heart rate and make you sweat (moderate-intensity exercise).  Do strength-training exercises at least twice a week.  Watch Your Levels of Cholesterol and Blood Lipids  Have your blood tested for lipids and cholesterol every 5 years starting at 80 years of age. If you are at high risk for heart disease, you should start having your blood tested when you are 80 years old. You may need to have your cholesterol levels checked more often if: ? Your lipid or cholesterol levels are high. ? You are older than 80 years of age. ? You are at high risk for heart disease.  What should I know about cancer screening? Many types of cancers can be detected early and may often be prevented. Lung Cancer  You should be screened every year for lung cancer if: ? You are a current smoker who has smoked for at least 30 years. ? You are a former smoker who has quit within the past 15 years.  Talk to your health care provider about your screening options, when you should start screening, and how often you should be screened.  Colorectal Cancer  Routine colorectal cancer screening usually begins at 80 years of age and should be repeated every 5-10 years until you are 80 years old. You may need to be screened more often if early forms of  precancerous polyps or small growths are found. Your health care provider may recommend screening at an earlier age if you have risk factors for colon cancer.  Your health care provider may recommend using home test kits to check for hidden blood in the stool.  A small camera at the end of a tube can be used to examine your colon (sigmoidoscopy or colonoscopy). This checks for the earliest forms of colorectal cancer.  Prostate and Testicular Cancer  Depending on your age and overall health, your health care provider may do certain tests to screen for prostate and testicular cancer.  Talk to your health care provider about any symptoms or concerns you have about testicular or prostate cancer.  Skin Cancer  Check your skin from head to toe regularly.  Tell your health care provider about any new moles or changes in moles, especially if: ? There is a change in a mole's size, shape, or color. ? You have a mole that is larger than a pencil eraser.  Always use sunscreen. Apply sunscreen liberally and repeat throughout the day.  Protect yourself by wearing long sleeves, pants, a wide-brimmed hat, and sunglasses when outside.  What should I know about heart disease, diabetes, and high blood pressure?  If you are 18-39 years of age, have your blood pressure checked every 3-5 years. If you are 40 years of age or older, have your blood pressure checked every year. You should have   your blood pressure measured twice-once when you are at a hospital or clinic, and once when you are not at a hospital or clinic. Record the average of the two measurements. To check your blood pressure when you are not at a hospital or clinic, you can use: ? An automated blood pressure machine at a pharmacy. ? A home blood pressure monitor.  Talk to your health care provider about your target blood pressure.  If you are between 45-79 years old, ask your health care provider if you should take aspirin to prevent heart  disease.  Have regular diabetes screenings by checking your fasting blood sugar level. ? If you are at a normal weight and have a low risk for diabetes, have this test once every three years after the age of 45. ? If you are overweight and have a high risk for diabetes, consider being tested at a younger age or more often.  A one-time screening for abdominal aortic aneurysm (AAA) by ultrasound is recommended for men aged 65-75 years who are current or former smokers. What should I know about preventing infection? Hepatitis B If you have a higher risk for hepatitis B, you should be screened for this virus. Talk with your health care provider to find out if you are at risk for hepatitis B infection. Hepatitis C Blood testing is recommended for:  Everyone born from 1945 through 1965.  Anyone with known risk factors for hepatitis C.  Sexually Transmitted Diseases (STDs)  You should be screened each year for STDs including gonorrhea and chlamydia if: ? You are sexually active and are younger than 80 years of age. ? You are older than 80 years of age and your health care provider tells you that you are at risk for this type of infection. ? Your sexual activity has changed since you were last screened and you are at an increased risk for chlamydia or gonorrhea. Ask your health care provider if you are at risk.  Talk with your health care provider about whether you are at high risk of being infected with HIV. Your health care provider may recommend a prescription medicine to help prevent HIV infection.  What else can I do?  Schedule regular health, dental, and eye exams.  Stay current with your vaccines (immunizations).  Do not use any tobacco products, such as cigarettes, chewing tobacco, and e-cigarettes. If you need help quitting, ask your health care provider.  Limit alcohol intake to no more than 2 drinks per day. One drink equals 12 ounces of beer, 5 ounces of wine, or 1 ounces of  hard liquor.  Do not use street drugs.  Do not share needles.  Ask your health care provider for help if you need support or information about quitting drugs.  Tell your health care provider if you often feel depressed.  Tell your health care provider if you have ever been abused or do not feel safe at home. This information is not intended to replace advice given to you by your health care provider. Make sure you discuss any questions you have with your health care provider. Document Released: 06/23/2007 Document Revised: 08/24/2015 Document Reviewed: 09/28/2014 Elsevier Interactive Patient Education  2018 Elsevier Inc.  

## 2016-09-24 NOTE — Progress Notes (Signed)
   Subjective:    Patient ID: Danny Gross, male    DOB: Aug 30, 1934, 82 y.o.   MRN: [id number]  HPI Here for medicare wellness and physical, no new complaints. Please see A/P for status and treatment of chronic medical problems.   Diet: heart healthy Physical activity: active Depression/mood screen: negative Hearing: intact to whispered voice Visual acuity: grossly normal with lens, performs annual eye exam  ADLs: capable Fall risk: none Home safety: good Cognitive evaluation: intact to orientation, naming, recall and repetition EOL planning: adv directives discussed, in place  I have personally reviewed and have noted 1. The patient's medical and social history - reviewed today no changes 2. Their use of alcohol, tobacco or illicit drugs 3. Their current medications and supplements 4. The patient's functional ability including ADL's, fall risks, home safety risks and hearing or visual impairment. 5. Diet and physical activities 6. Evidence for depression or mood disorders 7. Care team reviewed and updated (available in snapshot)  Review of Systems  Constitutional: Negative.   HENT: Negative.   Eyes: Negative.   Respiratory: Negative for cough, chest tightness and shortness of breath.   Cardiovascular: Negative for chest pain, palpitations and leg swelling.  Gastrointestinal: Negative for abdominal distention, abdominal pain, constipation, diarrhea, nausea and vomiting.  Musculoskeletal: Negative.   Skin: Negative.   Neurological: Negative.   Psychiatric/Behavioral: Negative.       Objective:   Physical Exam  Constitutional: He is oriented to person, place, and time. He appears well-developed and well-nourished.  HENT:  Head: Normocephalic and atraumatic.  Eyes: EOM are normal.  Neck: Normal range of motion.  Cardiovascular: Normal rate and regular rhythm.   Pulmonary/Chest: Effort normal and breath sounds normal. No respiratory distress. He has no wheezes. He has  no rales.  Abdominal: Soft. Bowel sounds are normal. He exhibits no distension. There is no tenderness. There is no rebound.  Musculoskeletal: He exhibits no edema.  Neurological: He is alert and oriented to person, place, and time. Coordination normal.  Skin: Skin is warm and dry.  Psychiatric: He has a normal mood and affect.   Vitals:   09/24/16 1259  BP: 118/80  Pulse: (!) 57  Temp: 98.6 F (37 C)  TempSrc: Oral  SpO2: 98%  Weight: 141 lb (64 kg)  Height:  (1.753 m)      Assessment & Plan:

## 2016-09-24 NOTE — Assessment & Plan Note (Signed)
Has had first shingrix, will get second. Gets flu shot in October. Colonoscopy up to date before aged out. Tdap and pneumonia are up to date. Counseled on sun safety and mole surveillance as well as dangers of distracted driving. Given 10 year screening recommendations.

## 2016-10-05 ENCOUNTER — Telehealth: Payer: Self-pay | Admitting: *Deleted

## 2016-10-05 NOTE — Telephone Encounter (Signed)
-----   Message from Judyann Munson, MD sent at 10/04/2016  6:08 PM EDT ----- Can you call Danny Gross on my behalf and ask him if he can stop by to pick up sputum cups so that we can redo cultures. I would like to see if can repeat his AFB cultures since he has m. Abscessus and want to do extended susceptibility

## 2016-10-05 NOTE — Telephone Encounter (Signed)
Left message with patient's wife.  They will come Monday to pick up the cup, will bring sample back Tuesday. Andree Coss, RN

## 2016-10-16 ENCOUNTER — Other Ambulatory Visit: Payer: Self-pay | Admitting: Internal Medicine

## 2016-10-16 DIAGNOSIS — A310 Pulmonary mycobacterial infection: Secondary | ICD-10-CM

## 2016-10-25 ENCOUNTER — Other Ambulatory Visit: Payer: Medicare Other

## 2016-10-25 DIAGNOSIS — R918 Other nonspecific abnormal finding of lung field: Secondary | ICD-10-CM

## 2016-11-12 ENCOUNTER — Ambulatory Visit: Payer: Medicare Other | Admitting: Internal Medicine

## 2016-11-12 ENCOUNTER — Encounter: Payer: Self-pay | Admitting: Internal Medicine

## 2016-11-12 VITALS — BP 134/85 | HR 64 | Temp 98.3°F | Ht 69.5 in | Wt 147.5 lb

## 2016-11-12 DIAGNOSIS — J479 Bronchiectasis, uncomplicated: Secondary | ICD-10-CM

## 2016-11-12 DIAGNOSIS — A310 Pulmonary mycobacterial infection: Secondary | ICD-10-CM

## 2016-11-12 DIAGNOSIS — R918 Other nonspecific abnormal finding of lung field: Secondary | ICD-10-CM

## 2016-11-12 DIAGNOSIS — A319 Mycobacterial infection, unspecified: Secondary | ICD-10-CM

## 2016-11-12 NOTE — Progress Notes (Signed)
RFV: follow up for m. Abscessus lung disease   Patient ID: Danny Gross, male   DOB: [birth year], 82 y.o.   MRN: [id number]  HPI  82yo M with bronchiectasis and m.abscessus colonization. He had recent CT scan in June which showed overall stable disease process, some areas improved and some areas worsened. He feels that his activity is about the same. No fatigue. Requires roughly 8 hrs of sleep per day. He continues to swim, sing and walk. He has occasional productive cough. Overall unchanged.   Previous susceptibilities include:  Amikacin 16S Kanamycin8S Tobramycin>16R Cefoxitin32I Imipenem8I cipro>8R Doxy>16R moxi>4R tigecycline0.5S clarithro4I azithro32S augmentin>32/16 R BactrimR linezolid8I Clofazimine<0.5S Clofaz+amikacS   Outpatient Encounter Medications as of 11/12/2016  Medication Sig  . brimonidine-timolol (COMBIGAN) 0.2-0.5 % ophthalmic solution Place 1 drop into both eyes every 12 (twelve) hours.  . Cholecalciferol (VITAMIN D PO) Once every wk  . co-enzyme Q-10 30 MG capsule Take 30 mg by mouth daily.  . dorzolamide (TRUSOPT) 2 % ophthalmic solution Place 1 drop into both eyes 2 (two) times daily.  Marland Kitchen guaiFENesin (MUCINEX) 600 MG 12 hr tablet Take 600 mg by mouth 2 (two) times daily as needed.  . latanoprost (XALATAN) 0.005 % ophthalmic solution Place 1 drop into both eyes at bedtime.   No facility-administered encounter medications on file as of 11/12/2016.      Patient Active Problem List   Diagnosis Date Noted  . Impaired fasting glucose 10/04/2014  . Routine general medical examination at a health care facility 12/21/2013  . Pulmonary infiltrates 07/13/2013   Social History   Tobacco Use  .  Smoking status: Never Smoker  . Smokeless tobacco: Never Used  Substance Use Topics  . Alcohol use: Yes    Alcohol/week: 0.0 oz    Comment: cocktail daily  . Drug use: No    Review of Systems Per hpi, 12 point ros is negative Physical Exam   BP 134/85   Pulse 64   Temp 98.3 F (36.8 C) (Oral)   Ht 5' 9.5" (1.765 m)   Wt 147 lb 8 oz (66.9 kg)   BMI 21.47 kg/m   Physical Exam  Constitutional: He is oriented to person, place, and time. He appears well-developed and well-nourished. No distress.  HENT:  Mouth/Throat: Oropharynx is clear and moist. No oropharyngeal exudate.  Cardiovascular: Normal rate, regular rhythm and normal heart sounds. Exam reveals no gallop and no friction rub.  No murmur heard.  Pulmonary/Chest: Effort normal and breath sounds normal. No respiratory distress. He has no wheezes.  Abdominal: Soft. Bowel sounds are normal. He exhibits no distension. There is no tenderness.  Lymphadenopathy:  He has no cervical adenopathy.  Neurological: He is alert and oriented to person, place, and time.  Skin: Skin is warm and dry. No rash noted. No erythema.  Psychiatric: He has a normal mood and affect. His behavior is normal.    CBC Lab Results  Component Value Date   WBC 6.1 09/24/2016   RBC 4.83 09/24/2016   HGB 14.1 09/24/2016   HCT 43.9 09/24/2016   PLT 122.0 (L) 09/24/2016   MCV 91.0 09/24/2016   MCH 29.1 01/28/2014   MCHC 32.2 09/24/2016   RDW 13.6 09/24/2016   LYMPHSABS 1.0 01/28/2014   MONOABS 0.5 01/28/2014   EOSABS 0.3 01/28/2014    BMET Lab Results  Component Value Date   NA 138 09/24/2016   K 4.7 09/24/2016   CL 103 09/24/2016   CO2 29 09/24/2016   GLUCOSE 123 (  H) 09/24/2016   BUN 24 (H) 09/24/2016   CREATININE 1.20 09/24/2016   CALCIUM 9.6 09/24/2016   GFRNONAA 59 (L) 01/28/2014   GFRAA 68 01/28/2014   Micro - 2016, 10/2016  Imaging: CT CHEST WITHOUT CONTRAST  TECHNIQUE: Multidetector CT imaging of the chest was performed  following the standard protocol without IV contrast.  COMPARISON:  07/29/2015 chest CT.  FINDINGS: Cardiovascular: Normal heart size. No significant pericardial fluid/thickening. Atherosclerotic thoracic aorta with ectatic 4.0 cm ascending thoracic aorta, stable. Normal caliber pulmonary arteries.  Mediastinum/Nodes: No discrete thyroid nodules. Unremarkable esophagus. No pathologically enlarged axillary, mediastinal or gross hilar lymph nodes, noting limited sensitivity for the detection of hilar adenopathy on this noncontrast study.  Lungs/Pleura: No pneumothorax. No pleural effusion. No acute consolidative airspace disease. Moderate scattered cylindrical and varicoid bronchiectasis throughout both lungs involving all lung lobes, most prominent in the medial basilar right upper lobe, medial right middle lobe and left lower lobe, mildly worsened in the basilar right upper lobe and otherwise stable. There is extensive patchy tree-in-bud opacity, mucous plugging and centrilobular nodularity throughout both lungs at the areas of bronchiectasis, with mild improvement in these findings in the peripheral right upper and right lower lobes and mild worsening of these findings in the left lower lobe (for example new clustered perilobular nodularity in the left lower lobe on series 5/ image 136). Dominant subpleural 3.8 x 2.7 cm focus of consolidation in the medial basilar right upper lobe (series 5/image 85) is stable. Peripheral right upper lobe 1.7 x 0.8 cm nodular opacity (series 5/image 67) is decreased from 2.0 x 1.3 cm.  Assessment and Plan  M.abscessus pulmonary infection = macrolide sensitive isolate had previously been identified. He has repeat sputum culture this month that is growing NTM. Awaiting identification and susceptibility. Will check sed rate and crp. Also get esophagram to see if he is a repeat aspirate as risk for bronchiectasis  Bronchiectasis = continue with  mucinex BID and will have him do his accapella PEP valve daily which he had not been doing  Hx of aortic aneurysm = has followed up with CT surgery who felt it is stable, and recommended to come back summer of 2020 for follow up  Spent 40 min with patient with greater than 50% on counseling of NTM infection

## 2016-11-12 NOTE — Patient Instructions (Signed)
  Check out this website for information on NTM =  [email or web address].[email or web address]

## 2016-12-06 ENCOUNTER — Telehealth: Payer: Self-pay | Admitting: *Deleted

## 2016-12-06 NOTE — Telephone Encounter (Signed)
[facility] calling to report identification complete for sputum culture collected 10/18.  It is being sent for sensitivity. Andree CossHowell, Araseli Sherry M, RN

## 2017-01-05 LAB — CP MYCOBACTERIAL IDENTIFICATION

## 2017-01-05 LAB — MYCOBACTERIA,CULT W/FLUOROCHROME SMEAR
MICRO NUMBER:: 81165424
SPECIMEN QUALITY:: ADEQUATE

## 2017-01-27 ENCOUNTER — Encounter: Payer: Self-pay | Admitting: Internal Medicine

## 2017-03-12 ENCOUNTER — Ambulatory Visit: Payer: Medicare Other | Admitting: Internal Medicine

## 2017-03-12 ENCOUNTER — Encounter: Payer: Self-pay | Admitting: Internal Medicine

## 2017-03-12 VITALS — BP 126/80 | HR 63 | Temp 98.0°F | Ht 69.0 in | Wt 148.0 lb

## 2017-03-12 DIAGNOSIS — A319 Mycobacterial infection, unspecified: Secondary | ICD-10-CM

## 2017-03-12 DIAGNOSIS — I712 Thoracic aortic aneurysm, without rupture, unspecified: Secondary | ICD-10-CM

## 2017-03-12 DIAGNOSIS — J479 Bronchiectasis, uncomplicated: Secondary | ICD-10-CM

## 2017-03-12 NOTE — Progress Notes (Signed)
Patient ID: Danny Gross, male   DOB: 09/15/1934, 83 y.o.   MRN: [id number]  HPI Danny Gross is a pleasant 83yo M with m.abscessus pulmonary infection initially dx in 2016 where he has had intermittent cough but not necessarily having any fatigue.he maintains a very active life with running, singing, swimming and ballroom dancing. His chest CT has shown some progression of disease but he is relatively asymptomatic. He denies worsening fatigue, weight loss, fever, chills, nigthsweats, good energy level. We have isolated m.abscessus on several  Respiratory cultures, last cx in oct 2018  He reports that his brother passed away unexpectedly of CAD roughly 2 weeks ago who was 2 years older.  His isolate is R-macrolide,   Previous susceptibilities include:  Amikacin 16S Kanamycin8S Tobramycin>16R Cefoxitin32I Imipenem8I cipro>8R Doxy>16R moxi>4R tigecycline0.5S clarithro4I azithro32S augmentin>32/16 R BactrimR linezolid8I Clofazimine<0.5S Clofaz+amikacS     Outpatient Encounter Medications as of 03/12/2017  Medication Sig  . brimonidine-timolol (COMBIGAN) 0.2-0.5 % ophthalmic solution Place 1 drop into both eyes every 12 (twelve) hours.  . Cholecalciferol (VITAMIN D PO) Once every wk  . co-enzyme Q-10 30 MG capsule Take 30 mg by mouth daily.  . dorzolamide (TRUSOPT) 2 % ophthalmic solution Place 1 drop into both eyes 2 (two) times daily.  Marland Kitchen guaiFENesin (MUCINEX) 600 MG 12 hr tablet Take 600 mg by mouth 2 (two) times daily as needed.  . latanoprost (XALATAN) 0.005 % ophthalmic solution Place 1 drop into both eyes at bedtime.   No facility-administered encounter medications on file as of 03/12/2017.      Patient Active  Problem List   Diagnosis Date Noted  . Impaired fasting glucose 10/04/2014  . Routine general medical examination at a health care facility 12/21/2013  . Pulmonary infiltrates 07/13/2013     There are no preventive care reminders to display for this patient.   Social History   Socioeconomic History  . Marital status: Married    Spouse name: None  . Number of children: None  . Years of education: None  . Highest education level: None  Social Needs  . Financial resource strain: None  . Food insecurity - worry: None  . Food insecurity - inability: None  . Transportation needs - medical: None  . Transportation needs - non-medical: None  Occupational History  . Occupation: Retired  Tobacco Use  . Smoking status: Never Smoker  . Smokeless tobacco: Never Used  Substance and Sexual Activity  . Alcohol use: Yes    Alcohol/week: 0.0 oz    Comment: cocktail daily  . Drug use: No  . Sexual activity: None  Other Topics Concern  . None  Social History Narrative  . None    Review of Systems Occ. Cough. Otherwise 12 point ros is negative Physical Exam   BP 126/80   Pulse 63   Temp 98 F (36.7 C) (Oral)   Ht 5\' 9"  (1.753 m)   Wt 148 lb (67.1 kg)   BMI 21.86 kg/m   Physical Exam  Constitutional: He is oriented to person, place, and time. He appears well-developed and well-nourished. No distress.  HENT:  Mouth/Throat: Oropharynx is clear and moist. No oropharyngeal exudate.  Cardiovascular: Normal rate, regular rhythm and normal heart sounds. Exam reveals no gallop and no friction rub.  No murmur heard.  Pulmonary/Chest: Effort normal and breath sounds normal. No respiratory distress. He has no wheezes.  Abdominal: Soft. Bowel sounds are normal. He exhibits no distension. There is no tenderness.  Lymphadenopathy:  He  has no cervical adenopathy.  Neurological: He is alert and oriented to person, place, and time.  Skin: Skin is warm and dry. No rash noted. No erythema.    Psychiatric: He has a normal mood and affect. His behavior is normal.    CBC Lab Results  Component Value Date   WBC 6.1 09/24/2016   RBC 4.83 09/24/2016   HGB 14.1 09/24/2016   HCT 43.9 09/24/2016   PLT 122.0 (L) 09/24/2016   MCV 91.0 09/24/2016   MCH 29.1 01/28/2014   MCHC 32.2 09/24/2016   RDW 13.6 09/24/2016   LYMPHSABS 1.0 01/28/2014   MONOABS 0.5 01/28/2014   EOSABS 0.3 01/28/2014    BMET Lab Results  Component Value Date   NA 138 09/24/2016   K 4.7 09/24/2016   CL 103 09/24/2016   CO2 29 09/24/2016   GLUCOSE 123 (H) 09/24/2016   BUN 24 (H) 09/24/2016   CREATININE 1.20 09/24/2016   CALCIUM 9.6 09/24/2016   GFRNONAA 59 (L) 01/28/2014   GFRAA 68 01/28/2014      Assessment and Plan  Pulmonary NTM infection = will plan to still hold off on treatment at this time unless has more changes of symptoms. When we do decide to treat, it would include iv ami, iimipenem plus clofazamine +/- tedzolid given that it is macrolide R  Will plan to get pulmonary function test no need to repeat chest CT at this time.  Hx of aortic aneurysm = has seen CT surgery who plan to check with him every 12-24 months.

## 2017-06-17 ENCOUNTER — Ambulatory Visit: Payer: Medicare Other | Admitting: Internal Medicine

## 2017-06-17 ENCOUNTER — Encounter: Payer: Self-pay | Admitting: Internal Medicine

## 2017-06-17 VITALS — BP 148/80 | HR 66 | Temp 98.6°F | Wt 143.0 lb

## 2017-06-17 DIAGNOSIS — J479 Bronchiectasis, uncomplicated: Secondary | ICD-10-CM

## 2017-06-17 DIAGNOSIS — A319 Mycobacterial infection, unspecified: Secondary | ICD-10-CM | POA: Diagnosis not present

## 2017-06-17 NOTE — Progress Notes (Signed)
RFV :follow up for pulmonary m.abscessus colonization  Patient ID: Danny Gross, male   DOB: 1934-12-08, 83 y.o.   MRN: [id number]  HPI Danny Gross is an 83yo M with hx of pulmonary m.abscessus colonization. He reports that he is in good health. Continues to sing, dance, swim, and run. He states that often after singing he has more sputum production. His wife does not think he is coughing more, denies shortness of breath. Feels better than at last visit. Going to the [place] to visit family this summer.  Outpatient Encounter Medications as of 06/17/2017  Medication Sig  . brimonidine-timolol (COMBIGAN) 0.2-0.5 % ophthalmic solution Place 1 drop into both eyes every 12 (twelve) hours.  . Cholecalciferol (VITAMIN D PO) Once every wk  . co-enzyme Q-10 30 MG capsule Take 30 mg by mouth daily.  . dorzolamide (TRUSOPT) 2 % ophthalmic solution Place 1 drop into both eyes 2 (two) times daily.  Marland Kitchen guaiFENesin (MUCINEX) 600 MG 12 hr tablet Take 600 mg by mouth 2 (two) times daily as needed.  . latanoprost (XALATAN) 0.005 % ophthalmic solution Place 1 drop into both eyes at bedtime.   No facility-administered encounter medications on file as of 06/17/2017.      Patient Active Problem List   Diagnosis Date Noted  . Impaired fasting glucose 10/04/2014  . Routine general medical examination at a health care facility 12/21/2013  . Pulmonary infiltrates 07/13/2013   Social History   Tobacco Use  . Smoking status: Never Smoker  . Smokeless tobacco: Never Used  Substance Use Topics  . Alcohol use: Yes    Alcohol/week: 0.0 oz    Comment: cocktail daily    There are no preventive care reminders to display for this patient.   Review of Systems Review of Systems  Constitutional: Negative for fever, chills, diaphoresis, activity change, appetite change, fatigue and unexpected weight change.  HENT: Negative for congestion, sore throat, rhinorrhea, sneezing, trouble swallowing and sinus pressure.    Eyes: Negative for photophobia and visual disturbance.  Respiratory: Negative for cough, chest tightness, shortness of breath, wheezing and stridor.  Cardiovascular: Negative for chest pain, palpitations and leg swelling.  Gastrointestinal: Negative for nausea, vomiting, abdominal pain, diarrhea, constipation, blood in stool, abdominal distention and anal bleeding.  Genitourinary: Negative for dysuria, hematuria, flank pain and difficulty urinating.  Musculoskeletal: Negative for myalgias, back pain, joint swelling, arthralgias and gait problem.  Skin: Negative for color change, pallor, rash and wound.  Neurological: Negative for dizziness, tremors, weakness and light-headedness.  Hematological: Negative for adenopathy. Does not bruise/bleed easily.  Psychiatric/Behavioral: Negative for behavioral problems, confusion, sleep disturbance, dysphoric mood, decreased concentration and agitation.    Physical Exam   BP (!) 148/80   Pulse 66   Temp 98.6 F (37 C) (Oral)   Wt 143 lb (64.9 kg)   BMI 21.12 kg/m   Physical Exam  Constitutional: He is oriented to person, place, and time. He appears well-developed and well-nourished. No distress.  HENT:  Mouth/Throat: Oropharynx is clear and moist. No oropharyngeal exudate.  Cardiovascular: Normal rate, regular rhythm and normal heart sounds. Exam reveals no gallop and no friction rub.  No murmur heard.  Pulmonary/Chest: Effort normal and breath sounds normal. No respiratory distress. He has no wheezes.  Abdominal: Soft. Bowel sounds are normal. He exhibits no distension. There is no tenderness.  Lymphadenopathy:  He has no cervical adenopathy.  Neurological: He is alert and oriented to person, place, and time.  Skin: Skin is warm and  dry. No rash noted. No erythema.  Psychiatric: He has a normal mood and affect. His behavior is normal.    CBC Lab Results  Component Value Date   WBC 6.1 09/24/2016   RBC 4.83 09/24/2016   HGB 14.1  09/24/2016   HCT 43.9 09/24/2016   PLT 122.0 (L) 09/24/2016   MCV 91.0 09/24/2016   MCH 29.1 01/28/2014   MCHC 32.2 09/24/2016   RDW 13.6 09/24/2016   LYMPHSABS 1.0 01/28/2014   MONOABS 0.5 01/28/2014   EOSABS 0.3 01/28/2014    BMET Lab Results  Component Value Date   NA 138 09/24/2016   K 4.7 09/24/2016   CL 103 09/24/2016   CO2 29 09/24/2016   GLUCOSE 123 (H) 09/24/2016   BUN 24 (H) 09/24/2016   CREATININE 1.20 09/24/2016   CALCIUM 9.6 09/24/2016   GFRNONAA 59 (L) 01/28/2014   GFRAA 68 01/28/2014    Assessment and Plan  M.abscessus pulmonary colonization vs. Disease = patient is relatively asymptomatic. By his report, I do not sense that ntm colonization is impacting his pulmonary function. For now will hold off on treatment. Will get PFT in the fall  Bronchiectasis = will discuss using inhaler with hypertonic solution with aerobika at next appt. He reports no difficulty producing sputum at this time. Often his singing exercise - he has sputum production  rtc in the Fall

## 2017-07-19 ENCOUNTER — Telehealth: Payer: Medicare Other | Admitting: Family

## 2017-07-19 DIAGNOSIS — H9209 Otalgia, unspecified ear: Secondary | ICD-10-CM

## 2017-07-19 MED ORDER — LEVOCETIRIZINE DIHYDROCHLORIDE 5 MG PO TABS: 5.0000 mg | ORAL_TABLET | Freq: Every evening | ORAL | 1 refills | Status: DC

## 2017-07-19 MED ORDER — FLUTICASONE PROPIONATE 50 MCG/ACT NA SUSP: 2.0000 | Freq: Every day | NASAL | 6 refills | Status: DC

## 2017-07-19 NOTE — Progress Notes (Signed)
E visit for Allergic Rhinitis We are sorry that you are not feeling well.  Her is how we plan to help!  Based on what you have shared with me it looks like you have Allergic Rhinitis.  Rhinitis is when a reaction occurs that causes nasal congestion, runny nose, sneezing, and itching.  Most types of rhinitis are caused by an inflammation and are associated with symptoms in the eyes ears or throat. There are several types of rhinitis.  The most common are acute rhinitis, which is usually caused by a viral illness, allergic or seasonal rhinitis, and nonallergic or year-round rhinitis.  Nasal allergies occur certain times of the year.  Allergic rhinitis is caused when allergens in the air trigger the release of histamine in the body.  Histamine causes itching, swelling, and fluid to build up in the fragile linings of the nasal passages, sinuses and eyelids.  An itchy nose and clear discharge are common.  I recommend the following over the counter treatments: You should take a daily dose of antihistamine and Xyzal 5 mg take 1 tablet daily  I also would recommend a nasal spray: Flonase 2 sprays into each nostril once daily     HOME CARE:   You can use an over-the-counter saline nasal spray as needed  Avoid areas where there is heavy dust, mites, or molds  Stay indoors on windy days during the pollen season  Keep windows closed in home, at least in bedroom; use air conditioner.  Use high-efficiency house air filter  Keep windows closed in car, turn AC on re-circulate  Avoid playing out with dog during pollen season  GET HELP RIGHT AWAY IF:   If your symptoms do not improve within 10 days  You become short of breath  You develop yellow or green discharge from your nose for over 3 days  You have coughing fits  MAKE SURE YOU:   Understand these instructions  Will watch your condition  Will get help right away if you are not doing well or get worse  Thank you for choosing an  e-visit. Your e-visit answers were reviewed by a board certified advanced clinical practitioner to complete your personal care plan. Depending upon the condition, your plan could have included both over the counter or prescription medications. Please review your pharmacy choice. Be sure that the pharmacy you have chosen is open so that you can pick up your prescription now.  If there is a problem you may message your provider in MyChart to have the prescription routed to another pharmacy. Your safety is important to us. If you have drug allergies check your prescription carefully.  For the next 24 hours, you can use MyChart to ask questions about today's visit, request a non-urgent call back, or ask for a work or school excuse from your e-visit provider. You will get an email in the next two days asking about your experience. I hope that your e-visit has been valuable and will speed your recovery.         

## 2017-07-27 ENCOUNTER — Encounter: Payer: Self-pay | Admitting: Internal Medicine

## 2017-07-30 ENCOUNTER — Other Ambulatory Visit: Payer: Self-pay

## 2017-07-30 ENCOUNTER — Encounter: Payer: Self-pay | Admitting: Internal Medicine

## 2017-07-30 ENCOUNTER — Ambulatory Visit (INDEPENDENT_AMBULATORY_CARE_PROVIDER_SITE_OTHER): Payer: Medicare Other | Admitting: Internal Medicine

## 2017-07-30 VITALS — BP 110/80 | HR 52 | Temp 97.8°F | Ht 69.0 in | Wt 144.0 lb

## 2017-07-30 DIAGNOSIS — H9191 Unspecified hearing loss, right ear: Secondary | ICD-10-CM | POA: Diagnosis not present

## 2017-07-30 MED ORDER — AMOXICILLIN-POT CLAVULANATE 875-125 MG PO TABS: 1.0000 | ORAL_TABLET | Freq: Two times a day (BID) | ORAL | 0 refills | Status: DC

## 2017-07-30 NOTE — Assessment & Plan Note (Signed)
Covering for ear infection with augmentin. Referral to ENT and advised to use debrox over the counter on the right ear. Some hearing change with partial disimpaction of the right ear canal. Likely needs full clearance with ENT and possible hearing exam. TM unable to be visualized on the right today.

## 2017-07-30 NOTE — Patient Instructions (Signed)
We have sent in augmentin to take 1 pill twice a day for 5 days.   Get some debrox ear drops over the counter to help soften the wax to clear them.  We will get you in with the ear nose and throat specialist to clear out the ear in case the ear drops do not work.

## 2017-07-30 NOTE — Progress Notes (Signed)
   Subjective:    Patient ID: Danny RutterGerald W Kubicki, male    DOB: Dec 17, 1934, 83 y.o.   MRN: [id number]  HPI The patient is an 83 YO man coming in for 2-3 months of sinus problems. He lost hearing in right ear and did e-visit and they gave him xyzal and flonase. He started using that and this did not help at all. He is taking 1/2 pill xyzal due to excessive sedation with whole pill. He denies nasal drainage or fevers or chills. Denies cough or SOB. Doing normal activities. Appetite is normal. Most significant problem is right hearing loss. Left ear is normal.   Review of Systems  Constitutional: Negative for activity change, appetite change, chills, fatigue, fever and unexpected weight change.  HENT: Positive for congestion, hearing loss and sinus pressure. Negative for ear discharge, ear pain, postnasal drip, rhinorrhea, sinus pain, sneezing, sore throat, tinnitus, trouble swallowing and voice change.   Eyes: Negative.   Respiratory: Negative for cough, chest tightness, shortness of breath and wheezing.   Cardiovascular: Negative.   Gastrointestinal: Negative.   Musculoskeletal: Negative.   Neurological: Negative.       Objective:   Physical Exam  Constitutional: He is oriented to person, place, and time. He appears well-developed and well-nourished.  HENT:  Head: Normocephalic and atraumatic.  Left TM normal, right TM unable to visualize due to excessive wax. Able to disimpact some wax during visit but still unable to visualize the TM.   Eyes: EOM are normal.  Neck: Normal range of motion.  Cardiovascular: Normal rate and regular rhythm.  Pulmonary/Chest: Effort normal and breath sounds normal. No respiratory distress. He has no wheezes. He has no rales.  Abdominal: Soft. Bowel sounds are normal. He exhibits no distension. There is no tenderness. There is no rebound.  Musculoskeletal: He exhibits no edema.  Neurological: He is alert and oriented to person, place, and time. Coordination  normal.  Skin: Skin is warm and dry.  Psychiatric: He has a normal mood and affect.   Vitals:   07/30/17 0855  BP: 110/80  Pulse: (!) 52  Temp: 97.8 F (36.6 C)  TempSrc: Oral  SpO2: 98%  Weight: 144 lb (65.3 kg)  Height: 5\' 9"  (1.753 m)      Assessment & Plan:

## 2017-09-02 ENCOUNTER — Encounter: Payer: Self-pay | Admitting: Internal Medicine

## 2017-09-18 ENCOUNTER — Encounter: Payer: Self-pay | Admitting: Internal Medicine

## 2017-09-19 ENCOUNTER — Ambulatory Visit: Payer: Medicare Other | Admitting: Internal Medicine

## 2017-09-19 ENCOUNTER — Encounter: Payer: Self-pay | Admitting: Internal Medicine

## 2017-09-19 DIAGNOSIS — M79602 Pain in left arm: Secondary | ICD-10-CM | POA: Diagnosis not present

## 2017-09-19 MED ORDER — DOXYCYCLINE HYCLATE 100 MG PO TABS: 100.0000 mg | ORAL_TABLET | Freq: Two times a day (BID) | ORAL | 0 refills | Status: DC

## 2017-09-19 NOTE — Progress Notes (Signed)
   Subjective:    Patient ID: Danny Gross, male    DOB: 05/02/1934, 83 y.o.   MRN: [id number]  HPI The patient is an 83 YO man coming in for 3 months of a wound on his arm. He has spent time with his son helping them move into property in [place] acres. He denies a tick but may have had one. Has had some lyme disease in the family and wants to make sure this is taken care of. Some pain in it, denies itching. Denies numbness or weakness. Denies myalgias or headaches.   Review of Systems  Constitutional: Negative.   HENT: Negative.   Eyes: Negative.   Respiratory: Negative for cough, chest tightness and shortness of breath.   Cardiovascular: Negative for chest pain, palpitations and leg swelling.  Gastrointestinal: Negative for abdominal distention, abdominal pain, constipation, diarrhea, nausea and vomiting.  Musculoskeletal: Negative.   Skin: Positive for wound.  Neurological: Negative.   Psychiatric/Behavioral: Negative.       Objective:   Physical Exam  Constitutional: He is oriented to person, place, and time. He appears well-developed and well-nourished.  HENT:  Head: Normocephalic and atraumatic.  Eyes: EOM are normal.  Neck: Normal range of motion.  Cardiovascular: Normal rate and regular rhythm.  Pulmonary/Chest: Effort normal and breath sounds normal. No respiratory distress. He has no wheezes. He has no rales.  Abdominal: Soft. Bowel sounds are normal. He exhibits no distension. There is no tenderness. There is no rebound.  Musculoskeletal: He exhibits no edema.  Neurological: He is alert and oriented to person, place, and time. Coordination normal.  Skin: Skin is warm and dry. Rash noted.  Wound with mild rash about 1 cm circular on the left upper arm   Vitals:   09/19/17 1500  BP: 122/82  Pulse: 64  Temp: 98.5 F (36.9 C)  TempSrc: Oral  SpO2: 96%  Weight: 141 lb (64 kg)  Height: 5\' 9"  (1.753 m)      Assessment & Plan:

## 2017-09-19 NOTE — Patient Instructions (Signed)
We have sent in doxycycline to take 1 pill twice a day for 1 week.  

## 2017-09-19 NOTE — Assessment & Plan Note (Signed)
Rx for doxycycline for 1 week given [place]

## 2017-09-23 NOTE — Progress Notes (Addendum)
Subjective:   Danny Gross is a 83 y.o. male who presents for Medicare Annual/Subsequent preventive examination.  Review of Systems:  No ROS.  Medicare Wellness Visit. Additional risk factors are reflected in the social history.  Cardiac Risk Factors include: advanced age (>35men, >14 women);male gender Sleep patterns: feels rested on waking, gets up 1-2 times nightly to void and sleeps 7 hours nightly.    Home Safety/Smoke Alarms: Feels safe in home. Smoke alarms in place.  Living environment; residence and Firearm Safety: 1-story house/ trailer, no firearms Lives with wife, no needs for DME, good support system. Seat Belt Safety/Bike Helmet: Wears seat belt.      Objective:    Vitals: BP 134/62   Pulse 66   Resp 17   Ht 5\' 9"  (1.753 m)   Wt 142 lb (64.4 kg)   SpO2 98%   BMI 20.97 kg/m   Body mass index is 20.97 kg/m.  Advanced Directives 09/25/2017 05/10/2015 08/11/2013  Does Patient Have a Medical Advance Directive? Yes Yes Patient has advance directive, copy not in chart  Type of Advance Directive Healthcare Power of [place];Living will - -  Copy of Healthcare Power of Attorney in Chart? No - copy requested - -    Tobacco Social History   Tobacco Use  Smoking Status Never Smoker  Smokeless Tobacco Never Used     Counseling given: Not Answered  Past Medical History:  Diagnosis Date  . Cataract   . Emphysema of lung [facility])    Past Surgical History:  Procedure Laterality Date  . HERNIA REPAIR  2003  . TONSILLECTOMY  1939  . VIDEO BRONCHOSCOPY Bilateral 08/11/2013   Procedure: VIDEO BRONCHOSCOPY WITH FLUORO;  Surgeon: Nyoka Cowden, MD;  Location: [facility];  Service: Cardiopulmonary;  Laterality: Bilateral;   Family History  Problem Relation Age of Onset  . Cancer Maternal Aunt        ? type    Social History   Socioeconomic History  . Marital status: Married    Spouse name: Not on file  . Number of children: 2  . Years of education: Not on file    . Highest education level: Not on file  Occupational History  . Occupation: Retired  Engineer, production  . Financial resource strain: Not hard at all  . Food insecurity:    Worry: Never true    Inability: Never true  . Transportation needs:    Medical: No    Non-medical: No  Tobacco Use  . Smoking status: Never Smoker  . Smokeless tobacco: Never Used  Substance and Sexual Activity  . Alcohol use: Yes    Alcohol/week: 0.0 standard drinks    Comment: cocktail daily  . Drug use: No  . Sexual activity: Not Currently  Lifestyle  . Physical activity:    Days per week: 6 days    Minutes per session: 60 min  . Stress: Not at all  Relationships  . Social connections:    Talks on phone: More than three times a week    Gets together: More than three times a week    Attends religious service: 1 to 4 times per year    Active member of club or organization: Yes    Attends meetings of clubs or organizations: More than 4 times per year    Relationship status: Married  Other Topics Concern  . Not on file  Social History Narrative  . Not on file    Outpatient Encounter Medications  as of 09/25/2017  Medication Sig  . brimonidine-timolol (COMBIGAN) 0.2-0.5 % ophthalmic solution Place 1 drop into both eyes every 12 (twelve) hours.  Marland Kitchen co-enzyme Q-10 30 MG capsule Take 30 mg by mouth daily.  . dorzolamide (TRUSOPT) 2 % ophthalmic solution Place 1 drop into both eyes 2 (two) times daily.  Marland Kitchen doxycycline (VIBRA-TABS) 100 MG tablet Take 1 tablet (100 mg total) by mouth 2 (two) times daily.  Marland Kitchen guaiFENesin (MUCINEX) 600 MG 12 hr tablet Take 600 mg by mouth 2 (two) times daily as needed.  . latanoprost (XALATAN) 0.005 % ophthalmic solution Place 1 drop into both eyes at bedtime.  . [DISCONTINUED] fluticasone (FLONASE) 50 MCG/ACT nasal spray Place 2 sprays into both nostrils daily. (Patient not taking: Reported on 09/25/2017)  . [DISCONTINUED] levocetirizine (XYZAL) 5 MG tablet Take 1 tablet (5 mg total)  by mouth every evening. (Patient not taking: Reported on 09/25/2017)   No facility-administered encounter medications on file as of 09/25/2017.     Activities of Daily Living In your present state of health, do you have any difficulty performing the following activities: 09/25/2017  Hearing? N  Vision? N  Difficulty concentrating or making decisions? N  Walking or climbing stairs? N  Dressing or bathing? N  Doing errands, shopping? N  Preparing Food and eating ? N  Using the Toilet? N  In the past six months, have you accidently leaked urine? N  Do you have problems with loss of bowel control? N  Managing your Medications? N  Managing your Finances? N  Housekeeping or managing your Housekeeping? N  Some recent data might be hidden    Patient Care Team: Myrlene Broker, MD as PCP - General (Internal Medicine)   Assessment:   This is a routine wellness examination for Danny Gross. Physical assessment deferred to PCP.   Exercise Activities and Dietary recommendations Current Exercise Habits: Home exercise routine;Structured exercise class, Time (Minutes): 60, Frequency (Times/Week): 5, Weekly Exercise (Minutes/Week): 300, Intensity: Mild, Exercise limited by: None identified  Diet (meal preparation, eat out, water intake, caffeinated beverages, dairy products, fruits and vegetables): in general, a "healthy" diet  , well balanced  Reviewed heart healthy and diabetic diet. Encouraged patient to increase daily water and healthy fluid intake.  Goals      Patient Stated   . patient (pt-stated)     Maintain health; see kids;  Traveling to see son in June;       Other   . Patient Stated     Maintain current health status.       Fall Risk Fall Risk  09/25/2017 03/12/2017 11/12/2016 09/24/2016 05/10/2015  Falls in the past year? No No No No No  Risk for fall due to : Impaired balance/gait;Impaired mobility - - - -    Depression Screen PHQ 2/9 Scores 09/25/2017 03/12/2017 11/12/2016  09/24/2016  PHQ - 2 Score 0 0 0 0    Cognitive Function MMSE - Mini Mental State Exam 09/25/2017  Orientation to time 5  Orientation to Place 5  Registration 3  Attention/ Calculation 5  Recall 2  Language- name 2 objects 2  Language- repeat 1  Language- follow 3 step command 3  Language- read & follow direction 1  Write a sentence 1  Copy design 1  Total score 29        Immunization History  Administered Date(s) Administered  . Influenza Split 10/08/2012  . Influenza, High Dose Seasonal PF 10/04/2014  . Influenza,inj,Quad PF,6+ Mos 09/18/2013  .  Influenza-Unspecified 10/22/2015, 10/12/2016  . Pneumococcal Conjugate-13 07/27/2013  . Pneumococcal Polysaccharide-23 10/04/2014  . Tdap 01/08/2010  . Zoster 01/08/2006  . Zoster Recombinat (Shingrix) 06/19/2016, 11/02/2016   Screening Tests Health Maintenance  Topic Date Due  . INFLUENZA VACCINE  05/09/2018 (Originally 08/08/2017)  . TETANUS/TDAP  01/09/2020  . PNA vac Low Risk Adult  Completed      Plan:     Continue doing brain stimulating activities (puzzles, reading, adult coloring books, staying active) to keep memory sharp.   Continue to eat heart healthy diet (full of fruits, vegetables, whole grains, lean protein, water--limit salt, fat, and sugar intake) and increase physical activity as tolerated.  I have personally reviewed and noted the following in the patient's chart:   . Medical and social history . Use of alcohol, tobacco or illicit drugs  . Current medications and supplements . Functional ability and status . Nutritional status . Physical activity . Advanced directives . List of other physicians . Vitals . Screenings to include cognitive, depression, and falls . Referrals and appointments  In addition, I have reviewed and discussed with patient certain preventive protocols, quality metrics, and best practice recommendations. A written personalized care plan for preventive services as well as  general preventive health recommendations were provided to patient.     Danny PlumpJill A Oshay Stranahan, RN  09/25/2017  Medical screening examination/treatment/procedure(s) were performed by non-physician practitioner and as supervising physician I was immediately available for consultation/collaboration. I agree with above. Jacinta ShoeAleksei Plotnikov, MD

## 2017-09-25 ENCOUNTER — Ambulatory Visit (INDEPENDENT_AMBULATORY_CARE_PROVIDER_SITE_OTHER): Payer: Medicare Other | Admitting: *Deleted

## 2017-09-25 VITALS — BP 134/62 | HR 66 | Resp 17 | Ht 69.0 in | Wt 142.0 lb

## 2017-09-25 DIAGNOSIS — Z0000 Encounter for general adult medical examination without abnormal findings: Secondary | ICD-10-CM

## 2017-09-25 NOTE — Patient Instructions (Addendum)
Continue doing brain stimulating activities (puzzles, reading, adult coloring books, staying active) to keep memory sharp.   Continue to eat heart healthy diet (full of fruits, vegetables, whole grains, lean protein, water--limit salt, fat, and sugar intake) and increase physical activity as tolerated.   Health Maintenance, Male A healthy lifestyle and preventive care is important for your health and wellness. Ask your health care provider about what schedule of regular examinations is right for you. What should I know about weight and diet? Eat a Healthy Diet  Eat plenty of vegetables, fruits, whole grains, low-fat dairy products, and lean protein.  Do not eat a lot of foods high in solid fats, added sugars, or salt.  Maintain a Healthy Weight Regular exercise can help you achieve or maintain a healthy weight. You should:  Do at least 150 minutes of exercise each week. The exercise should increase your heart rate and make you sweat (moderate-intensity exercise).  Do strength-training exercises at least twice a week.  Watch Your Levels of Cholesterol and Blood Lipids  Have your blood tested for lipids and cholesterol every 5 years starting at 81 years of age. If you are at high risk for heart disease, you should start having your blood tested when you are 81 years old. You may need to have your cholesterol levels checked more often if: ? Your lipid or cholesterol levels are high. ? You are older than 81 years of age. ? You are at high risk for heart disease.  What should I know about cancer screening? Many types of cancers can be detected early and may often be prevented. Lung Cancer  You should be screened every year for lung cancer if: ? You are a current smoker who has smoked for at least 30 years. ? You are a former smoker who has quit within the past 15 years.  Talk to your health care provider about your screening options, when you should start screening, and how often you  should be screened.  Colorectal Cancer  Routine colorectal cancer screening usually begins at 81 years of age and should be repeated every 5-10 years until you are 81 years old. You may need to be screened more often if early forms of precancerous polyps or small growths are found. Your health care provider may recommend screening at an earlier age if you have risk factors for colon cancer.  Your health care provider may recommend using home test kits to check for hidden blood in the stool.  A small camera at the end of a tube can be used to examine your colon (sigmoidoscopy or colonoscopy). This checks for the earliest forms of colorectal cancer.  Prostate and Testicular Cancer  Depending on your age and overall health, your health care provider may do certain tests to screen for prostate and testicular cancer.  Talk to your health care provider about any symptoms or concerns you have about testicular or prostate cancer.  Skin Cancer  Check your skin from head to toe regularly.  Tell your health care provider about any new moles or changes in moles, especially if: ? There is a change in a mole's size, shape, or color. ? You have a mole that is larger than a pencil eraser.  Always use sunscreen. Apply sunscreen liberally and repeat throughout the day.  Protect yourself by wearing long sleeves, pants, a wide-brimmed hat, and sunglasses when outside.  What should I know about heart disease, diabetes, and high blood pressure?  If you are 18-39   years of age, have your blood pressure checked every 3-5 years. If you are 40 years of age or older, have your blood pressure checked every year. You should have your blood pressure measured twice-once when you are at a hospital or clinic, and once when you are not at a hospital or clinic. Record the average of the two measurements. To check your blood pressure when you are not at a hospital or clinic, you can use: ? An automated blood pressure  machine at a pharmacy. ? A home blood pressure monitor.  Talk to your health care provider about your target blood pressure.  If you are between 45-79 years old, ask your health care provider if you should take aspirin to prevent heart disease.  Have regular diabetes screenings by checking your fasting blood sugar level. ? If you are at a normal weight and have a low risk for diabetes, have this test once every three years after the age of 45. ? If you are overweight and have a high risk for diabetes, consider being tested at a younger age or more often.  A one-time screening for abdominal aortic aneurysm (AAA) by ultrasound is recommended for men aged 65-75 years who are current or former smokers. What should I know about preventing infection? Hepatitis B If you have a higher risk for hepatitis B, you should be screened for this virus. Talk with your health care provider to find out if you are at risk for hepatitis B infection. Hepatitis C Blood testing is recommended for:  Everyone born from 1945 through 1965.  Anyone with known risk factors for hepatitis C.  Sexually Transmitted Diseases (STDs)  You should be screened each year for STDs including gonorrhea and chlamydia if: ? You are sexually active and are younger than 81 years of age. ? You are older than 81 years of age and your health care provider tells you that you are at risk for this type of infection. ? Your sexual activity has changed since you were last screened and you are at an increased risk for chlamydia or gonorrhea. Ask your health care provider if you are at risk.  Talk with your health care provider about whether you are at high risk of being infected with HIV. Your health care provider may recommend a prescription medicine to help prevent HIV infection.  What else can I do?  Schedule regular health, dental, and eye exams.  Stay current with your vaccines (immunizations).  Do not use any tobacco products,  such as cigarettes, chewing tobacco, and e-cigarettes. If you need help quitting, ask your health care provider.  Limit alcohol intake to no more than 2 drinks per day. One drink equals 12 ounces of beer, 5 ounces of wine, or 1 ounces of hard liquor.  Do not use street drugs.  Do not share needles.  Ask your health care provider for help if you need support or information about quitting drugs.  Tell your health care provider if you often feel depressed.  Tell your health care provider if you have ever been abused or do not feel safe at home. This information is not intended to replace advice given to you by your health care provider. Make sure you discuss any questions you have with your health care provider. Document Released: 06/23/2007 Document Revised: 08/24/2015 Document Reviewed: 09/28/2014 Elsevier Interactive Patient Education  2018 [employer] Inc[employer]  

## 2017-11-18 ENCOUNTER — Ambulatory Visit (INDEPENDENT_AMBULATORY_CARE_PROVIDER_SITE_OTHER): Payer: Medicare Other | Admitting: Internal Medicine

## 2017-11-18 ENCOUNTER — Encounter: Payer: Self-pay | Admitting: Internal Medicine

## 2017-11-18 ENCOUNTER — Ambulatory Visit
Admission: RE | Admit: 2017-11-18 | Discharge: 2017-11-18 | Disposition: A | Discharge status: Home / Self Care | Payer: Medicare Other | Source: Ambulatory Visit | Attending: Internal Medicine | Admitting: Internal Medicine

## 2017-11-18 VITALS — BP 162/103 | HR 59 | Temp 97.6°F | Ht 69.0 in | Wt 145.0 lb

## 2017-11-18 DIAGNOSIS — A319 Mycobacterial infection, unspecified: Secondary | ICD-10-CM | POA: Diagnosis not present

## 2017-11-18 DIAGNOSIS — J479 Bronchiectasis, uncomplicated: Secondary | ICD-10-CM | POA: Diagnosis not present

## 2017-11-18 NOTE — Progress Notes (Signed)
RFV: folllow up for m.abscessus pulmonary disease  Patient ID: Danny Gross, male   DOB: 09/27/1934, 83 y.o.   MRN: [id number]  HPI Danny Gross is a 83yo M with hx of m.abscessus pulmonary colonization/disease, No change in his exercise habit Had 1 week of feeling fatigue after flu shot, now back to normal activity. He denies shortness of breath with ambulation. Has sputum production often cleared with his singing exercises. Going to [place], [place] for winter  Outpatient Encounter Medications as of 11/18/2017  Medication Sig  . brimonidine-timolol (COMBIGAN) 0.2-0.5 % ophthalmic solution Place 1 drop into both eyes every 12 (twelve) hours.  Marland Kitchen co-enzyme Q-10 30 MG capsule Take 30 mg by mouth daily.  . dorzolamide (TRUSOPT) 2 % ophthalmic solution Place 1 drop into both eyes 2 (two) times daily.  Marland Kitchen doxycycline (VIBRA-TABS) 100 MG tablet Take 1 tablet (100 mg total) by mouth 2 (two) times daily. (Patient not taking: Reported on 11/18/2017)  . guaiFENesin (MUCINEX) 600 MG 12 hr tablet Take 600 mg by mouth 2 (two) times daily as needed.  . latanoprost (XALATAN) 0.005 % ophthalmic solution Place 1 drop into both eyes at bedtime.   No facility-administered encounter medications on file as of 11/18/2017.      Patient Active Problem List   Diagnosis Date Noted  . Left arm pain 09/19/2017  . Acute hearing loss, right 07/30/2017  . Impaired fasting glucose 10/04/2014  . Routine general medical examination at a health care facility 12/21/2013  . Pulmonary infiltrates 07/13/2013     There are no preventive care reminders to display for this patient.   Review of Systems Review of Systems  Constitutional: Negative for fever, chills, diaphoresis, activity change, appetite change, fatigue and unexpected weight change.  HENT: Negative for congestion, sore throat, rhinorrhea, sneezing, trouble swallowing and sinus pressure.  Eyes: Negative for photophobia and visual disturbance.  Respiratory:  Negative for cough, chest tightness, shortness of breath, wheezing and stridor.  Cardiovascular: Negative for chest pain, palpitations and leg swelling.  Gastrointestinal: Negative for nausea, vomiting, abdominal pain, diarrhea, constipation, blood in stool, abdominal distention and anal bleeding.  Genitourinary: Negative for dysuria, hematuria, flank pain and difficulty urinating.  Musculoskeletal: Negative for myalgias, back pain, joint swelling, arthralgias and gait problem.  Skin: Negative for color change, pallor, rash and wound.  Neurological: Negative for dizziness, tremors, weakness and light-headedness.  Hematological: Negative for adenopathy. Does not bruise/bleed easily.  Psychiatric/Behavioral: Negative for behavioral problems, confusion, sleep disturbance, dysphoric mood, decreased concentration and agitation.    Physical Exam   BP (!) 162/103   Pulse (!) 59   Temp 97.6 F (36.4 C)   Ht 5\' 9"  (1.753 m)   Wt 145 lb (65.8 kg)   BMI 21.41 kg/m   Physical Exam  Constitutional: He is oriented to person, place, and time. He appears well-developed and well-nourished. No distress.  HENT:  Mouth/Throat: Oropharynx is clear and moist. No oropharyngeal exudate.  Cardiovascular: Normal rate, regular rhythm and normal heart sounds. Exam reveals no gallop and no friction rub.  No murmur heard.  Pulmonary/Chest: Effort normal and breath sounds normal. No respiratory distress. He has no wheezes.  Abdominal: Soft. Bowel sounds are normal. He exhibits no distension. There is no tenderness.  Lymphadenopathy:  He has no cervical adenopathy.  Neurological: He is alert and oriented to person, place, and time.  Skin: Skin is warm and dry. No rash noted. No erythema.  Psychiatric: He has a normal mood and affect. His behavior is  normal.    CBC Lab Results  Component Value Date   WBC 6.1 09/24/2016   RBC 4.83 09/24/2016   HGB 14.1 09/24/2016   HCT 43.9 09/24/2016   PLT 122.0 (L)  09/24/2016   MCV 91.0 09/24/2016   MCH 29.1 01/28/2014   MCHC 32.2 09/24/2016   RDW 13.6 09/24/2016   LYMPHSABS 1.0 01/28/2014   MONOABS 0.5 01/28/2014   EOSABS 0.3 01/28/2014    BMET Lab Results  Component Value Date   NA 138 09/24/2016   K 4.7 09/24/2016   CL 103 09/24/2016   CO2 29 09/24/2016   GLUCOSE 123 (H) 09/24/2016   BUN 24 (H) 09/24/2016   CREATININE 1.20 09/24/2016   CALCIUM 9.6 09/24/2016   GFRNONAA 59 (L) 01/28/2014   GFRAA 68 01/28/2014   cxr at this visit = shows bronchiectatic changes but improved since 2011 imaging, corresponds to areas of involvement from chest CT in June 2018   Assessment and Plan Bronchiectasis with hx of pulmonary m.abscessus colonization.disease = will get cxr for surveillance. Presently not terribly symptomatic  His singing exercises provides adequate pulmonary hygiene. Defer treatment at this time continue to monitor for symptoms as sign of progression of NTM infection.

## 2018-02-12 ENCOUNTER — Telehealth: Payer: Self-pay

## 2018-02-12 NOTE — Telephone Encounter (Signed)
Opened in error

## 2018-02-12 NOTE — Telephone Encounter (Signed)
Patient made aware of Dr Feliz BeamSnider's recommendations via MyChart massages.  Also advised patient to wear a mask if he needed to for extra precaution.  Valarie ConesShaquenia Rithy Mandley, LPN

## 2018-03-19 ENCOUNTER — Ambulatory Visit: Payer: Medicare Other | Admitting: Internal Medicine

## 2018-04-09 ENCOUNTER — Other Ambulatory Visit: Payer: Self-pay | Admitting: Cardiothoracic Surgery

## 2018-04-09 DIAGNOSIS — I712 Thoracic aortic aneurysm, without rupture, unspecified: Secondary | ICD-10-CM

## 2018-04-22 ENCOUNTER — Encounter: Payer: Self-pay | Admitting: Family

## 2018-04-22 ENCOUNTER — Ambulatory Visit (INDEPENDENT_AMBULATORY_CARE_PROVIDER_SITE_OTHER): Payer: Medicare Other | Admitting: Family

## 2018-04-22 ENCOUNTER — Other Ambulatory Visit: Payer: Self-pay

## 2018-04-22 DIAGNOSIS — J479 Bronchiectasis, uncomplicated: Secondary | ICD-10-CM | POA: Insufficient documentation

## 2018-04-22 DIAGNOSIS — A319 Mycobacterial infection, unspecified: Secondary | ICD-10-CM | POA: Insufficient documentation

## 2018-04-22 DIAGNOSIS — A318 Other mycobacterial infections: Secondary | ICD-10-CM | POA: Insufficient documentation

## 2018-04-22 NOTE — Assessment & Plan Note (Signed)
Does not appear to be active infection at present and may be colonization. Consider follow up chest x-ray in 4 months or sooner if symptoms warrant.

## 2018-04-22 NOTE — Assessment & Plan Note (Deleted)
Currently well controlled off medications with no exacerbation or worsening of symptoms. He continues excellent pulmonary hygiene. Recommend continued monitoring.  Plan for follow up in 4 months or sooner if needed.  

## 2018-04-22 NOTE — Progress Notes (Signed)
Subjective:    Patient ID: Danny Gross, male    DOB: 03/10/1934, 84 y.o.   MRN: [id number]  Chief Complaint  Patient presents with  . Mycobacterium Infection     Virtual Visit via Telephone Note   I connected with Danny Gross  on 04/22/2018 at 11 AM by telephone and verified that I am speaking with the correct person using two identifiers.   I discussed the limitations, risks, security and privacy concerns of performing an evaluation and management service by telephone and the availability of in person appointments. I also discussed with the patient that there may be a patient responsible charge related to this service. The patient expressed understanding and agreed to proceed.   HPI:  Danny Gross is a 84 y.o. 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 Treatment remained deferred at that time as he was not overly symptomatic.  Danny Gross has been doing well since his last office visit and has maintained baseline status. He continues to swim, go for weighted walks, and performs resistance training 3x per week. He is eating and drinking well. Respiratory symptoms are well controlled and checks his pulse ox daily with average between 97-98%. He denies changes in shortness of breath or sputum production. No systemic symptoms of fevers, chills or sweats.     No Known Allergies    Outpatient Medications Prior to Visit  Medication Sig Dispense Refill  . brimonidine-timolol (COMBIGAN) 0.2-0.5 % ophthalmic solution Place 1 drop into both eyes every 12 (twelve) hours.    Marland Kitchen. co-enzyme Q-10 30 MG capsule Take 30 mg by mouth daily.    . dorzolamide (TRUSOPT) 2 % ophthalmic solution Place 1 drop into both eyes 2 (two) times daily.    Marland Kitchen. guaiFENesin (MUCINEX) 600 MG 12 hr tablet Take 600 mg by mouth 2 (two) times daily as needed.    . latanoprost (XALATAN) 0.005 % ophthalmic  solution Place 1 drop into both eyes at bedtime.    Marland Kitchen. doxycycline (VIBRA-TABS) 100 MG tablet Take 1 tablet (100 mg total) by mouth 2 (two) times daily. (Patient not taking: Reported on 04/22/2018) 14 tablet 0   No facility-administered medications prior to visit.      Past Medical History:  Diagnosis Date  . Cataract   . Emphysema of lung [facility](HCC)      Past Surgical History:  Procedure Laterality Date  . HERNIA REPAIR  2003  . TONSILLECTOMY  1939  . VIDEO BRONCHOSCOPY Bilateral 08/11/2013   Procedure: VIDEO BRONCHOSCOPY WITH FLUORO;  Surgeon: Nyoka CowdenMichael B Wert, MD;  Location: [facility];  Service: Cardiopulmonary;  Laterality: Bilateral;     Review of Systems  Constitutional: Negative for appetite change, chills, fatigue, fever and unexpected weight change.  Eyes: Negative for visual disturbance.  Respiratory: Negative for cough, chest tightness, shortness of breath and wheezing.   Cardiovascular: Negative for chest pain.  Skin: Negative for rash.  Neurological: Negative for dizziness and headaches.      Objective:    There were no vitals taken for this visit. Nursing note and vital signs reviewed.  Physical Exam    Danny Gross is pleasant to speak with and able to speak in full sentences and did not cough during our conversation.  Assessment & Plan:   Problem List Items Addressed This Visit      Respiratory   Bronchiectasis without complication (HCC) - Primary    Currently  well controlled off medications with no exacerbation or worsening of symptoms. He continues excellent pulmonary hygiene. Recommend continued monitoring.  Plan for follow up in 4 months or sooner if needed.         Other   Mycobacterium abscessus infection    Does not appear to be active infection at present and may be colonization. Consider follow up chest x-ray in 4 months or sooner if symptoms warrant.           I am having Danny Rutter "Maggie Schwalbe" maintain his latanoprost,  brimonidine-timolol, dorzolamide, guaiFENesin, co-enzyme Q-10, and doxycycline.   I discussed the assessment and treatment plan with the patient. The patient was provided an opportunity to ask questions and all were answered. The patient agreed with the plan and demonstrated an understanding of the instructions.   The patient was advised to call back or seek an in-person evaluation if the symptoms worsen or if the condition fails to improve as anticipated.   I provided 18  minutes of non-face-to-face time during this encounter.  Follow-up: Return in about 4 months (around 08/22/2018), or if symptoms worsen or fail to improve.   Marcos Eke, MSN, FNP-C Nurse Practitioner [facility] Infectious Disease [facility] Main number: [phone]

## 2018-04-22 NOTE — Assessment & Plan Note (Signed)
Currently well controlled off medications with no exacerbation or worsening of symptoms. He continues excellent pulmonary hygiene. Recommend continued monitoring.  Plan for follow up in 4 months or sooner if needed.

## 2018-04-22 NOTE — Patient Instructions (Signed)
Nice to speak with you today.  You are doing great with all of the exercises which is good for your lungs and overall health.  We will continue to monitor with no recommended treatment at this time.  Plan for follow up with Dr. Drue Second in 4 months or sooner if needed should your symptoms worsen.

## 2018-06-17 ENCOUNTER — Ambulatory Visit
Admission: RE | Admit: 2018-06-17 | Discharge: 2018-06-17 | Disposition: A | Discharge status: Home / Self Care | Payer: Medicare Other | Source: Ambulatory Visit | Attending: Cardiothoracic Surgery | Admitting: Cardiothoracic Surgery

## 2018-06-17 DIAGNOSIS — I712 Thoracic aortic aneurysm, without rupture, unspecified: Secondary | ICD-10-CM

## 2018-06-17 MED ORDER — IOPAMIDOL (ISOVUE-370) INJECTION 76%
75.0000 mL | Freq: Once | INTRAVENOUS | Status: AC | PRN
  Administered 2018-06-17: 75 mL via INTRAVENOUS

## 2018-06-18 ENCOUNTER — Other Ambulatory Visit: Payer: Self-pay

## 2018-06-18 ENCOUNTER — Ambulatory Visit: Payer: Medicare Other | Admitting: Cardiothoracic Surgery

## 2018-06-18 ENCOUNTER — Encounter: Payer: Self-pay | Admitting: Cardiothoracic Surgery

## 2018-06-18 VITALS — BP 138/76 | HR 54 | Temp 97.5°F | Resp 16 | Ht 69.0 in | Wt 141.2 lb

## 2018-06-18 DIAGNOSIS — I712 Thoracic aortic aneurysm, without rupture, unspecified: Secondary | ICD-10-CM

## 2018-06-18 NOTE — Progress Notes (Signed)
PCP is Myrlene Brokerrawford, Elizabeth A, MD Referring Provider is Judyann MunsonSnider, Cynthia, MD  Chief Complaint  Patient presents with  . Thoracic Aortic Aneurysm    9 month f/u with CTA Chest    HPI: The patient returns for scheduled 1.5-year follow-up with CTA for an asymptomatic mild-moderate fusiform ascending aneurysm stable at 4 cm since 2015.  He does not have hypertension.  He is a never smoker.  He has no atherosclerotic vascular disease.  He has no aortic valve disease.  Patient is followed at the ID clinic for bronchiectasis and colonization of MAI  Since his last visit he has not been hospitalized or had any new cardiac problems   Past Medical History:  Diagnosis Date  . Cataract   . Emphysema of lung [facility](HCC)     Past Surgical History:  Procedure Laterality Date  . HERNIA REPAIR  2003  . TONSILLECTOMY  1939  . VIDEO BRONCHOSCOPY Bilateral 08/11/2013   Procedure: VIDEO BRONCHOSCOPY WITH FLUORO;  Surgeon: Nyoka CowdenMichael B Wert, MD;  Location: [facility];  Service: Cardiopulmonary;  Laterality: Bilateral;    Family History  Problem Relation Age of Onset  . Cancer Maternal Aunt        ? type     Social History Social History   Tobacco Use  . Smoking status: Never Smoker  . Smokeless tobacco: Never Used  Substance Use Topics  . Alcohol use: Yes    Alcohol/week: 0.0 standard drinks    Comment: cocktail daily  . Drug use: No    Current Outpatient Medications  Medication Sig Dispense Refill  . brimonidine-timolol (COMBIGAN) 0.2-0.5 % ophthalmic solution Place 1 drop into both eyes every 12 (twelve) hours.    Marland Kitchen. co-enzyme Q-10 30 MG capsule Take 30 mg by mouth daily.    . dorzolamide (TRUSOPT) 2 % ophthalmic solution Place 1 drop into both eyes 2 (two) times daily.    Marland Kitchen. guaiFENesin (MUCINEX) 600 MG 12 hr tablet Take 600 mg by mouth 2 (two) times daily as needed.    . latanoprost (XALATAN) 0.005 % ophthalmic solution Place 1 drop into both eyes at bedtime.     No current  facility-administered medications for this visit.     No Known Allergies  Review of Systems  Weight stable Productive cough managed with Mucinex No fever cough shortness of breath consistent with COVID viral infection No edema No abdominal pain No TIA or presyncope Positive problems with short-term memory  BP 138/76 (BP Location: Left Arm, Patient Position: Sitting, Cuff Size: Normal)   Pulse (!) 54   Temp (!) 97.5 F (36.4 C) (Skin)   Resp 16   Ht 5\' 9"  (1.753 m)   Wt 141 lb 3.2 oz (64 kg)   SpO2 93% Comment: RA  BMI 20.85 kg/m  Physical Exam      Exam    General- alert and comfortable.  Thin but appears very functional    Neck- no JVD, no cervical adenopathy palpable, no carotid bruit   Lungs-scattered rhonchi   Cor- regular rate and rhythm, no murmur , gallop   Abdomen- soft, non-tender   Extremities - warm, non-tender, minimal edema   Neuro- oriented, appropriate, no focal weakness    Diagnostic Tests: CT scan images reviewed.  The ascending aorta remains at 4 cm with smooth contour, no hematoma, no penetrating ulceration and minimal calcification  The patient has bilateral stable mild changes of bronchiectasis without evidence of active infection   Impression: Mild fusiform aneurysmal dilatation of the  ascending aorta. At the current size the risk for dissection is minimal. He understands importance of blood pressure monitoring and blood pressure control goal to keep systolic blood pressure less than 140 mmHg Plan: Return for repeat CTA in 1.5 years.  Importance of heart healthy lifestyle including heart healthy diet and daily aerobic activity discussed with patient.   Len Childs, MD [facility] [phone]

## 2018-06-20 ENCOUNTER — Ambulatory Visit: Payer: Medicare Other | Admitting: Cardiothoracic Surgery

## 2018-09-09 ENCOUNTER — Other Ambulatory Visit: Payer: Self-pay

## 2018-09-09 ENCOUNTER — Ambulatory Visit: Payer: Medicare Other | Admitting: Internal Medicine

## 2018-09-09 ENCOUNTER — Encounter: Payer: Self-pay | Admitting: Internal Medicine

## 2018-09-09 VITALS — BP 177/90 | HR 61 | Temp 97.8°F

## 2018-09-09 DIAGNOSIS — A319 Mycobacterial infection, unspecified: Secondary | ICD-10-CM

## 2018-09-09 DIAGNOSIS — J479 Bronchiectasis, uncomplicated: Secondary | ICD-10-CM

## 2018-09-09 DIAGNOSIS — A318 Other mycobacterial infections: Secondary | ICD-10-CM

## 2018-09-09 NOTE — Progress Notes (Signed)
RFV: bronchiectasis and NTM-m.abscessus lung disease  Patient ID: Danny Gross, male   DOB: [birth year], 84 y.o.   MRN: [id number]  HPI 84yo M with bronchiectasis plus colonization with pulmonary m.abscessus. he is doing well overall. Has occ productive cough but no change in his activity or weight or overall energy. He has followed up with CT surgery this summer for follow up on abd aortic aneurysm which is stable.  No sick contacts to coivd.  Outpatient Encounter Medications as of 09/09/2018  Medication Sig  . brimonidine-timolol (COMBIGAN) 0.2-0.5 % ophthalmic solution Place 1 drop into both eyes every 12 (twelve) hours.  Marland Kitchen. co-enzyme Q-10 30 MG capsule Take 30 mg by mouth daily.  . dorzolamide (TRUSOPT) 2 % ophthalmic solution Place 1 drop into both eyes 2 (two) times daily.  Marland Kitchen. guaiFENesin (MUCINEX) 600 MG 12 hr tablet Take 600 mg by mouth 2 (two) times daily as needed.  . latanoprost (XALATAN) 0.005 % ophthalmic solution Place 1 drop into both eyes at bedtime.   No facility-administered encounter medications on file as of 09/09/2018.      Patient Active Problem List   Diagnosis Date Noted  . Bronchiectasis without complication (HCC) 04/22/2018  . Mycobacterium abscessus infection 04/22/2018  . Left arm pain 09/19/2017  . Acute hearing loss, right 07/30/2017  . Impaired fasting glucose 10/04/2014  . Routine general medical examination at a health care facility 12/21/2013  . Pulmonary infiltrates 07/13/2013     Health Maintenance Due  Topic Date Due  . INFLUENZA VACCINE  08/09/2018     Review of Systems Review of Systems  Constitutional: Negative for fever, chills, diaphoresis, activity change, appetite change, fatigue and unexpected weight change.  HENT: Negative for congestion, sore throat, rhinorrhea, sneezing, trouble swallowing and sinus pressure.  Eyes: Negative for photophobia and visual disturbance.  Respiratory: Negative for cough, chest tightness, shortness of  breath, wheezing and stridor.  Cardiovascular: Negative for chest pain, palpitations and leg swelling.  Gastrointestinal: Negative for nausea, vomiting, abdominal pain, diarrhea, constipation, blood in stool, abdominal distention and anal bleeding.  Genitourinary: Negative for dysuria, hematuria, flank pain and difficulty urinating.  Musculoskeletal: Negative for myalgias, back pain, joint swelling, arthralgias and gait problem.  Skin: Negative for color change, pallor, rash and wound.  Neurological: Negative for dizziness, tremors, weakness and light-headedness.  Hematological: Negative for adenopathy. Does not bruise/bleed easily.  Psychiatric/Behavioral: Negative for behavioral problems, confusion, sleep disturbance, dysphoric mood, decreased concentration and agitation.  Social History   Tobacco Use  . Smoking status: Never Smoker  . Smokeless tobacco: Never Used  Substance Use Topics  . Alcohol use: Yes    Alcohol/week: 0.0 standard drinks    Comment: cocktail daily  . Drug use: No    Physical Exam   BP (!) 177/90   Pulse 61   Temp 97.8 F (36.6 C) (Oral)   Physical Exam  Constitutional: He is oriented to person, place, and time. He appears well-developed and well-nourished. No distress.  HENT:  Mouth/Throat: Oropharynx is clear and moist. No oropharyngeal exudate.  Cardiovascular: Normal rate, regular rhythm and normal heart sounds. Exam reveals no gallop and no friction rub.  No murmur heard.  Pulmonary/Chest: Effort normal and breath sounds normal. No respiratory distress. He has no wheezes.  Lymphadenopathy:  He has no cervical adenopathy.  Neurological: He is alert and oriented to person, place, and time.  Skin: Skin is warm and dry. No rash noted. No erythema.  Psychiatric: He has a normal mood and  affect. His behavior is normal.    CBC Lab Results  Component Value Date   WBC 6.1 09/24/2016   RBC 4.83 09/24/2016   HGB 14.1 09/24/2016   HCT 43.9 09/24/2016    PLT 122.0 (L) 09/24/2016   MCV 91.0 09/24/2016   MCH 29.1 01/28/2014   MCHC 32.2 09/24/2016   RDW 13.6 09/24/2016   LYMPHSABS 1.0 01/28/2014   MONOABS 0.5 01/28/2014   EOSABS 0.3 01/28/2014    BMET Lab Results  Component Value Date   NA 138 09/24/2016   K 4.7 09/24/2016   CL 103 09/24/2016   CO2 29 09/24/2016   GLUCOSE 123 (H) 09/24/2016   BUN 24 (H) 09/24/2016   CREATININE 1.20 09/24/2016   CALCIUM 9.6 09/24/2016   GFRNONAA 59 (L) 01/28/2014   GFRAA 68 01/28/2014      Assessment and Plan   Pulmonary m/abscessus colonization = continue to monitor for symptoms. No need to treat at this time.  Bronchiectasis = he does singing which in turn somewhat acts similar to flutter valve pulmonary hygiene exercise. Continue with current activities. He does not appear to have any exacerbation  Recommend high dose flu vaccine

## 2018-10-23 ENCOUNTER — Ambulatory Visit (INDEPENDENT_AMBULATORY_CARE_PROVIDER_SITE_OTHER): Payer: Medicare Other

## 2018-10-23 ENCOUNTER — Other Ambulatory Visit: Payer: Self-pay

## 2018-10-23 DIAGNOSIS — Z23 Encounter for immunization: Secondary | ICD-10-CM

## 2019-01-23 ENCOUNTER — Encounter: Payer: Self-pay | Admitting: Internal Medicine

## 2019-02-02 ENCOUNTER — Ambulatory Visit: Payer: Medicare PPO | Attending: Internal Medicine

## 2019-02-02 DIAGNOSIS — Z23 Encounter for immunization: Secondary | ICD-10-CM | POA: Insufficient documentation

## 2019-02-02 NOTE — Progress Notes (Signed)
   Covid-19 Vaccination Clinic  Name:  THEDFORD BUNTON    MRN: [id number] DOB: 04-17-1934  02/02/2019  Mr. Cuff was observed post Covid-19 immunization for 15 minutes without incidence. He was provided with Vaccine Information Sheet and instruction to access the V-Safe system.   Mr. Gunderman was instructed to call 911 with any severe reactions post vaccine: Marland Kitchen Difficulty breathing  . Swelling of your face and throat  . A fast heartbeat  . A bad rash all over your body  . Dizziness and weakness    Immunizations Administered    Name Date Dose VIS Date Route   Pfizer COVID-19 Vaccine 02/02/2019 10:54 AM 0.3 mL 12/19/2018 Intramuscular   Manufacturer: [employer], [employer]   Lot: [id number]   NDC: [address]-5

## 2019-02-06 ENCOUNTER — Encounter: Payer: Self-pay | Admitting: Internal Medicine

## 2019-02-20 ENCOUNTER — Ambulatory Visit: Payer: Medicare PPO | Attending: Internal Medicine

## 2019-02-20 DIAGNOSIS — Z23 Encounter for immunization: Secondary | ICD-10-CM | POA: Insufficient documentation

## 2019-02-20 NOTE — Progress Notes (Signed)
   Covid-19 Vaccination Clinic  Name:  Danny Gross    MRN: [id number] DOB: 1934/06/22  02/20/2019  [name] was observed post Covid-19 immunization for 15 minutes without incidence. He was provided with Vaccine Information Sheet and instruction to access the V-Safe system.   [name] was instructed to call 911 with any severe reactions post vaccine: Marland Kitchen Difficulty breathing  . Swelling of your face and throat  . A fast heartbeat  . A bad rash all over your body  . Dizziness and weakness    Immunizations Administered    Name Date Dose VIS Date Route   Pfizer COVID-19 Vaccine 02/20/2019 12:00 PM 0.3 mL 12/19/2018 Intramuscular   Manufacturer: [employer], [employer]   Lot: [id number]   NDC: [id number]

## 2019-03-23 ENCOUNTER — Ambulatory Visit: Payer: Medicare Other | Admitting: Internal Medicine

## 2019-03-25 ENCOUNTER — Ambulatory Visit: Payer: Medicare PPO | Admitting: Internal Medicine

## 2019-03-25 ENCOUNTER — Encounter: Payer: Self-pay | Admitting: Internal Medicine

## 2019-03-25 ENCOUNTER — Other Ambulatory Visit: Payer: Self-pay

## 2019-03-25 VITALS — BP 171/97 | HR 56 | Wt 150.0 lb

## 2019-03-25 DIAGNOSIS — A319 Mycobacterial infection, unspecified: Secondary | ICD-10-CM

## 2019-03-25 DIAGNOSIS — J479 Bronchiectasis, uncomplicated: Secondary | ICD-10-CM | POA: Diagnosis not present

## 2019-03-25 DIAGNOSIS — A318 Other mycobacterial infections: Secondary | ICD-10-CM

## 2019-03-25 NOTE — Progress Notes (Signed)
Patient ID: Danny Gross, male   DOB: 22-Sep-1934, 85 y.o.   MRN: [id number]  HPI 85yo M with hx of bronchiectasis and m.abscessus pulmonary colonization- doing well. Continues to be asx. Manages to still stay active this past year. Participates in choir via zoom. Singing helps clears phlegm. Wife reports that she thinks she notices less coughing. They have received their covid vaccine.  Outpatient Encounter Medications as of 03/25/2019  Medication Sig  . brimonidine-timolol (COMBIGAN) 0.2-0.5 % ophthalmic solution Place 1 drop into both eyes every 12 (twelve) hours.  . dorzolamide (TRUSOPT) 2 % ophthalmic solution Place 1 drop into both eyes 2 (two) times daily.  Marland Kitchen guaiFENesin (MUCINEX) 600 MG 12 hr tablet Take 600 mg by mouth 2 (two) times daily as needed.  . latanoprost (XALATAN) 0.005 % ophthalmic solution Place 1 drop into both eyes at bedtime.  Marland Kitchen co-enzyme Q-10 30 MG capsule Take 30 mg by mouth daily.   No facility-administered encounter medications on file as of 03/25/2019.     Patient Active Problem List   Diagnosis Date Noted  . Bronchiectasis without complication ([place]) 19/62/2297  . Mycobacterium abscessus infection 04/22/2018  . Left arm pain 09/19/2017  . Acute hearing loss, right 07/30/2017  . Impaired fasting glucose 10/04/2014  . Routine general medical examination at a health care facility 12/21/2013  . Pulmonary infiltrates 07/13/2013     There are no preventive care reminders to display for this patient.   Review of Systems Review of Systems  Constitutional: Negative for fever, chills, diaphoresis, activity change, appetite change, fatigue and unexpected weight change.  HENT: Negative for congestion, sore throat, rhinorrhea, sneezing, trouble swallowing and sinus pressure.  Eyes: Negative for photophobia and visual disturbance.  Respiratory: Negative for cough, chest tightness, shortness of breath, wheezing and stridor.  Cardiovascular: Negative for chest  pain, palpitations and leg swelling.  Gastrointestinal: Negative for nausea, vomiting, abdominal pain, diarrhea, constipation, blood in stool, abdominal distention and anal bleeding.  Genitourinary: Negative for dysuria, hematuria, flank pain and difficulty urinating.  Musculoskeletal: Negative for myalgias, back pain, joint swelling, arthralgias and gait problem.  Skin: Negative for color change, pallor, rash and wound.  Neurological: Negative for dizziness, tremors, weakness and light-headedness.  Hematological: Negative for adenopathy. Does not bruise/bleed easily.  Psychiatric/Behavioral: Negative for behavioral problems, confusion, sleep disturbance, dysphoric mood, decreased concentration and agitation.    Physical Exam   BP (!) 171/97 Comment: reports white coat syndrome  Pulse (!) 56   Wt 150 lb (68 kg)   BMI 22.15 kg/m    Physical Exam  Constitutional: He is oriented to person, place, and time. He appears well-developed and well-nourished. No distress.  HENT:  Mouth/Throat: Oropharynx is clear and moist. No oropharyngeal exudate.  Cardiovascular: Normal rate, regular rhythm and normal heart sounds. Exam reveals no gallop and no friction rub.  No murmur heard.  Pulmonary/Chest: Effort normal and breath sounds normal. No respiratory distress. He has no wheezes.  Lymphadenopathy:  He has no cervical adenopathy.  Skin: Skin is warm and dry. No rash noted. No erythema.  Psychiatric: He has a normal mood and affect. His behavior is normal.    CBC Lab Results  Component Value Date   WBC 6.1 09/24/2016   RBC 4.83 09/24/2016   HGB 14.1 09/24/2016   HCT 43.9 09/24/2016   PLT 122.0 (L) 09/24/2016   MCV 91.0 09/24/2016   MCH 29.1 01/28/2014   MCHC 32.2 09/24/2016   RDW 13.6 09/24/2016   LYMPHSABS  1.0 01/28/2014   MONOABS 0.5 01/28/2014   EOSABS 0.3 01/28/2014    BMET Lab Results  Component Value Date   NA 138 09/24/2016   K 4.7 09/24/2016   CL 103 09/24/2016   CO2  29 09/24/2016   GLUCOSE 123 (H) 09/24/2016   BUN 24 (H) 09/24/2016   CREATININE 1.20 09/24/2016   CALCIUM 9.6 09/24/2016   GFRNONAA 59 (L) 01/28/2014   GFRAA 68 01/28/2014      Assessment and Plan  Bronchiectasis = Will add flutter valve to ensure he has element of pulm hygiene. Keep up with singing as it helps him with clearing airways  M.abscessus = since asymptomatic. Will not treat at  This time. Follow up in 6-12 months

## 2019-03-25 NOTE — Patient Instructions (Addendum)
  To purchase: google search or [place] search: Acapella Flutter Valve-Green Hi Flow (it looks like a green avocado)  ------------------------------------------------------------------------- Please go to youtube.com and type in "how to use a flutter valve"  http://rios.biz/  Or [facility] (center for treatment of bronchiectasis ) website also has good videos :   GamingTrainers.si  ------------------------------------ How to Use the Acapella Assure proper setting of the dial on the end of the Acapella. This is the end opposite the mouthpiece. Your health care provider will set the dial when you get your Acapella. Rotate the end toward the + (plus) to increase resistance. Rotate the end toward -  (minus) to decrease resistance. Sit up with good posture to use the Acapella. Sometimes we may have you lie in a postural drainage position to use the Acapella. Postural drainage positions include lying flat on your back, flat on your side and alternating sides. Your health care provider will show you postural drainage positions to use if these are recommended. Take in a fairly deep breath and hold it for about 3 seconds. Place the Acapella mouthpiece in your mouth. Seal your lips tightly around the mouthpiece. Exhale as much as possible (but not to forcefully) through the mouthpiece. Keep your cheeks as firm as possible when you exhale. Try not to inhale through the device. Repeat this maneuver for 10 breaths. Try to resist coughing during this phase. After these 10 blows, perform 3 huffs, then a big cough to bring the sputum up and out. Try not to swallow the mucus. Huff coughing is a type of coughing if you have trouble clearing your mucus. Take a breath that is slightly deeper than normal. Use your stomach muscles to make a series of 3 rapid exhalations with the airway open,  making a "ha, ha, ha" sound. Follow this by normal breathing and a deep cough if you feel mucus moving. Repeat steps 1 through 7 for 15 minutes 2 to 4 times a day (or as prescribed by your health care provider). Your health care provider will tell you how many times a day to use the Acapella. If you use a short-acting inhaled bronchodilator, use the Acapella 15 minutes after you inhale the medicine.    How to Clean and Care for the Acapella (green or blue) Wash your hands. Clean the Acapella every day. Take the mouthpiece off the Acapella body. The mouthpiece and body need to be washed. Daily - Clean the mouthpiece and body of the Acapella daily with liquid dish detergent - Rinse with water. Disinfect Weekly - Clean the mouthpiece and body with alcohol - soak both parts in 70 percent isopropyl alcohol for 5 minutes. OR Clean mouthpiece and body with hydrogen peroxide - soak both parts in 3 percent hydrogen peroxide for 30 minutes. Note: The Acapella should not be placed in the automatic dishwasher, boiled or bleached. Rinse in clean water. Shake off excess water. Drain dry the device. Place each piece downward or rest the unit on its side. Replace the mouthpiece when the unit is completely dry and ready for use.

## 2019-04-01 DIAGNOSIS — Z85828 Personal history of other malignant neoplasm of skin: Secondary | ICD-10-CM | POA: Diagnosis not present

## 2019-04-01 DIAGNOSIS — L812 Freckles: Secondary | ICD-10-CM | POA: Diagnosis not present

## 2019-04-01 DIAGNOSIS — L821 Other seborrheic keratosis: Secondary | ICD-10-CM | POA: Diagnosis not present

## 2019-04-01 DIAGNOSIS — D485 Neoplasm of uncertain behavior of skin: Secondary | ICD-10-CM | POA: Diagnosis not present

## 2019-04-01 DIAGNOSIS — L570 Actinic keratosis: Secondary | ICD-10-CM | POA: Diagnosis not present

## 2019-04-01 DIAGNOSIS — C44519 Basal cell carcinoma of skin of other part of trunk: Secondary | ICD-10-CM | POA: Diagnosis not present

## 2019-04-08 DIAGNOSIS — Z791 Long term (current) use of non-steroidal anti-inflammatories (NSAID): Secondary | ICD-10-CM | POA: Diagnosis not present

## 2019-04-08 DIAGNOSIS — M1990 Unspecified osteoarthritis, unspecified site: Secondary | ICD-10-CM | POA: Diagnosis not present

## 2019-04-08 DIAGNOSIS — Z85828 Personal history of other malignant neoplasm of skin: Secondary | ICD-10-CM | POA: Diagnosis not present

## 2019-04-08 DIAGNOSIS — R030 Elevated blood-pressure reading, without diagnosis of hypertension: Secondary | ICD-10-CM | POA: Diagnosis not present

## 2019-04-08 DIAGNOSIS — H409 Unspecified glaucoma: Secondary | ICD-10-CM | POA: Diagnosis not present

## 2019-04-08 DIAGNOSIS — Z8249 Family history of ischemic heart disease and other diseases of the circulatory system: Secondary | ICD-10-CM | POA: Diagnosis not present

## 2019-04-21 DIAGNOSIS — Z9889 Other specified postprocedural states: Secondary | ICD-10-CM | POA: Diagnosis not present

## 2019-04-21 DIAGNOSIS — H401233 Low-tension glaucoma, bilateral, severe stage: Secondary | ICD-10-CM | POA: Diagnosis not present

## 2019-04-21 DIAGNOSIS — Z961 Presence of intraocular lens: Secondary | ICD-10-CM | POA: Diagnosis not present

## 2019-08-19 DIAGNOSIS — H43812 Vitreous degeneration, left eye: Secondary | ICD-10-CM | POA: Diagnosis not present

## 2019-08-19 DIAGNOSIS — H401233 Low-tension glaucoma, bilateral, severe stage: Secondary | ICD-10-CM | POA: Diagnosis not present

## 2019-08-19 DIAGNOSIS — Z9889 Other specified postprocedural states: Secondary | ICD-10-CM | POA: Diagnosis not present

## 2019-08-19 DIAGNOSIS — Z961 Presence of intraocular lens: Secondary | ICD-10-CM | POA: Diagnosis not present

## 2019-08-20 ENCOUNTER — Ambulatory Visit (INDEPENDENT_AMBULATORY_CARE_PROVIDER_SITE_OTHER): Payer: Medicare PPO | Admitting: Internal Medicine

## 2019-08-20 ENCOUNTER — Other Ambulatory Visit: Payer: Self-pay

## 2019-08-20 ENCOUNTER — Encounter: Payer: Self-pay | Admitting: Internal Medicine

## 2019-08-20 VITALS — BP 116/78 | HR 62 | Temp 98.0°F | Ht 69.0 in | Wt 145.0 lb

## 2019-08-20 DIAGNOSIS — R7301 Impaired fasting glucose: Secondary | ICD-10-CM | POA: Diagnosis not present

## 2019-08-20 DIAGNOSIS — J479 Bronchiectasis, uncomplicated: Secondary | ICD-10-CM

## 2019-08-20 DIAGNOSIS — Z0000 Encounter for general adult medical examination without abnormal findings: Secondary | ICD-10-CM

## 2019-08-20 NOTE — Assessment & Plan Note (Signed)
Checking labs and adjust as needed. 

## 2019-08-20 NOTE — Progress Notes (Signed)
Subjective:   Patient ID: Danny Gross, male    DOB: Feb 19, 1934, 85 y.o.   MRN: [id number]  HPI Here for medicare wellness and physical, no new complaints. Please see A/P for status and treatment of chronic medical problems.   Diet: heart healthy Physical activity: sedentary Depression/mood screen: negative Hearing: intact to whispered voice Visual acuity: grossly normal with lens, performs annual eye exam  ADLs: capable Fall risk: none Home safety: good Cognitive evaluation: intact to orientation, naming, recall and repetition EOL planning: adv directives discussed    Office Visit from 03/25/2019 in [facility] for Infectious Disease  PHQ-2 Total Score 0      I have personally reviewed and have noted 1. The patient's medical and social history - reviewed today no changes 2. Their use of alcohol, tobacco or illicit drugs 3. Their current medications and supplements 4. The patient's functional ability including ADL's, fall risks, home safety risks and hearing or visual impairment. 5. Diet and physical activities 6. Evidence for depression or mood disorders 7. Care team reviewed and updated  Patient Care Team: Myrlene Broker, MD as PCP - General (Internal Medicine) Past Medical History:  Diagnosis Date  . Cataract   . Emphysema of lung [facility])    Past Surgical History:  Procedure Laterality Date  . HERNIA REPAIR  2003  . TONSILLECTOMY  1939  . VIDEO BRONCHOSCOPY Bilateral 08/11/2013   Procedure: VIDEO BRONCHOSCOPY WITH FLUORO;  Surgeon: Nyoka Cowden, MD;  Location: [facility];  Service: Cardiopulmonary;  Laterality: Bilateral;   Family History  Problem Relation Age of Onset  . Cancer Maternal Aunt        ? type    Review of Systems  Constitutional: Negative.   HENT: Negative.   Eyes: Negative.   Respiratory: Negative for cough, chest tightness and shortness of breath.   Cardiovascular: Negative for chest pain, palpitations and leg  swelling.  Gastrointestinal: Negative for abdominal distention, abdominal pain, constipation, diarrhea, nausea and vomiting.  Musculoskeletal: Negative.   Skin: Negative.   Neurological: Negative.   Psychiatric/Behavioral: Negative.     Objective:  Physical Exam Constitutional:      Appearance: He is well-developed.  HENT:     Head: Normocephalic and atraumatic.  Cardiovascular:     Rate and Rhythm: Normal rate and regular rhythm.  Pulmonary:     Effort: Pulmonary effort is normal. No respiratory distress.     Breath sounds: Normal breath sounds. No wheezing or rales.  Abdominal:     General: Bowel sounds are normal. There is no distension.     Palpations: Abdomen is soft.     Tenderness: There is no abdominal tenderness. There is no rebound.  Musculoskeletal:     Cervical back: Normal range of motion.  Skin:    General: Skin is warm and dry.  Neurological:     Mental Status: He is alert and oriented to person, place, and time.     Coordination: Coordination normal.     Vitals:   08/20/19 0844  BP: 116/78  Pulse: 62  Temp: 98 F (36.7 C)  TempSrc: Oral  SpO2: 97%  Weight: 145 lb (65.8 kg)  Height: 5\' 9"  (1.753 m)   This visit occurred during the SARS-CoV-2 public health emergency.  Safety protocols were in place, including screening questions prior to the visit, additional usage of staff PPE, and extensive cleaning of exam room while observing appropriate contact time as indicated for disinfecting solutions.   Assessment &  Plan:

## 2019-08-20 NOTE — Assessment & Plan Note (Signed)
Flu shot yearly, covid-19 up to date. Pneumonia complete. Shingrix complete. Tetanus due 2022. Colonoscopy aged out. Counseled about sun safety and mole surveillance. Counseled about the dangers of distracted driving. Given 10 year screening recommendations.

## 2019-08-20 NOTE — Patient Instructions (Signed)

## 2019-08-20 NOTE — Assessment & Plan Note (Signed)
Stable with ID, no indication for treatment at this time.

## 2019-08-21 LAB — LIPID PANEL
Cholesterol: 179 mg/dL (ref <200)
HDL: 55 mg/dL (ref >=40)
LDL Cholesterol (Calc): 109 mg/dL (calc) — ABNORMAL HIGH
Non-HDL Cholesterol (Calc): 124 mg/dL (calc) (ref <130)
Total CHOL/HDL Ratio: 3.3 (calc) (ref <5.0)
Triglycerides: 66 mg/dL (ref <150)

## 2019-08-21 LAB — COMPREHENSIVE METABOLIC PANEL
AG Ratio: 1.7 (calc) (ref 1.0–2.5)
ALT: 13 U/L (ref 9–46)
AST: 16 U/L (ref 10–35)
Albumin: 4.1 g/dL (ref 3.6–5.1)
Alkaline phosphatase (APISO): 101 U/L (ref 35–144)
BUN: 25 mg/dL (ref 7–25)
CO2: 29 mmol/L (ref 20–32)
Calcium: 9.3 mg/dL (ref 8.6–10.3)
Chloride: 105 mmol/L (ref 98–110)
Creat: 1.1 mg/dL (ref 0.70–1.11)
Globulin: 2.4 g/dL (calc) (ref 1.9–3.7)
Glucose, Bld: 106 mg/dL — ABNORMAL HIGH (ref 65–99)
Potassium: 4.6 mmol/L (ref 3.5–5.3)
Sodium: 140 mmol/L (ref 135–146)
Total Bilirubin: 0.9 mg/dL (ref 0.2–1.2)
Total Protein: 6.5 g/dL (ref 6.1–8.1)

## 2019-08-21 LAB — CBC
HCT: 42.8 % (ref 38.5–50.0)
Hemoglobin: 13.9 g/dL (ref 13.2–17.1)
MCH: 29.9 pg (ref 27.0–33.0)
MCHC: 32.5 g/dL (ref 32.0–36.0)
MCV: 92 fL (ref 80.0–100.0)
MPV: 13.1 fL — ABNORMAL HIGH (ref 7.5–12.5)
Platelets: 109 10*3/uL — ABNORMAL LOW (ref 140–400)
RBC: 4.65 10*6/uL (ref 4.20–5.80)
RDW: 12.5 % (ref 11.0–15.0)
WBC: 5.5 10*3/uL (ref 3.8–10.8)

## 2019-08-21 LAB — HEMOGLOBIN A1C
Hgb A1c MFr Bld: 6 % of total Hgb — ABNORMAL HIGH (ref <5.7)
Mean Plasma Glucose: 126 (calc)
eAG (mmol/L): 7 (calc)

## 2019-09-03 ENCOUNTER — Encounter: Payer: Medicare PPO | Admitting: Internal Medicine

## 2019-09-29 ENCOUNTER — Ambulatory Visit: Payer: Medicare PPO | Admitting: Internal Medicine

## 2019-10-05 DIAGNOSIS — L570 Actinic keratosis: Secondary | ICD-10-CM | POA: Diagnosis not present

## 2019-10-05 DIAGNOSIS — Z85828 Personal history of other malignant neoplasm of skin: Secondary | ICD-10-CM | POA: Diagnosis not present

## 2019-10-05 DIAGNOSIS — D485 Neoplasm of uncertain behavior of skin: Secondary | ICD-10-CM | POA: Diagnosis not present

## 2019-10-05 DIAGNOSIS — C44519 Basal cell carcinoma of skin of other part of trunk: Secondary | ICD-10-CM | POA: Diagnosis not present

## 2019-10-05 DIAGNOSIS — L821 Other seborrheic keratosis: Secondary | ICD-10-CM | POA: Diagnosis not present

## 2019-10-05 DIAGNOSIS — L820 Inflamed seborrheic keratosis: Secondary | ICD-10-CM | POA: Diagnosis not present

## 2019-10-12 ENCOUNTER — Other Ambulatory Visit: Payer: Self-pay

## 2019-10-12 ENCOUNTER — Ambulatory Visit (INDEPENDENT_AMBULATORY_CARE_PROVIDER_SITE_OTHER): Payer: Medicare PPO | Admitting: Internal Medicine

## 2019-10-12 ENCOUNTER — Encounter: Payer: Self-pay | Admitting: Internal Medicine

## 2019-10-12 VITALS — BP 171/92 | HR 55 | Temp 98.0°F | Ht 69.0 in | Wt 144.5 lb

## 2019-10-12 DIAGNOSIS — J479 Bronchiectasis, uncomplicated: Secondary | ICD-10-CM | POA: Diagnosis not present

## 2019-10-12 DIAGNOSIS — A319 Mycobacterial infection, unspecified: Secondary | ICD-10-CM | POA: Diagnosis not present

## 2019-10-12 DIAGNOSIS — A318 Other mycobacterial infections: Secondary | ICD-10-CM

## 2019-10-12 NOTE — Progress Notes (Signed)
RFV: bronchiectasis and m.abscessus colonization  Patient ID: Danny Gross, male   DOB: February 18, 1934, 85 y.o.   MRN: [id number]  HPI Working in garden, and singing with tarheel chorus., sing with masks. M.abscessus dx in 2008, has some morning productive cough but doesn't have much other respiratory symptoms. Same exercise tolerance  Weight stable, exercise, sleeping- same No change in fatigue  Sept 9th, 3rd dose of pfizer --   Outpatient Encounter Medications as of 10/12/2019  Medication Sig  . brimonidine-timolol (COMBIGAN) 0.2-0.5 % ophthalmic solution Place 1 drop into both eyes every 12 (twelve) hours.  Marland Kitchen co-enzyme Q-10 30 MG capsule Take 30 mg by mouth daily.  . dorzolamide (TRUSOPT) 2 % ophthalmic solution Place 1 drop into both eyes 2 (two) times daily.  Marland Kitchen guaiFENesin (MUCINEX) 600 MG 12 hr tablet Take 600 mg by mouth 2 (two) times daily as needed.  . latanoprost (XALATAN) 0.005 % ophthalmic solution Place 1 drop into both eyes at bedtime.   No facility-administered encounter medications on file as of 10/12/2019.     Patient Active Problem List   Diagnosis Date Noted  . Bronchiectasis without complication (HCC) 04/22/2018  . Mycobacterium abscessus infection 04/22/2018  . Impaired fasting glucose 10/04/2014  . Routine general medical examination at a health care facility 12/21/2013  . Pulmonary infiltrates 07/13/2013     Health Maintenance Due  Topic Date Due  . INFLUENZA VACCINE  08/09/2019     Review of Systems Review of Systems  Constitutional: Negative for fever, chills, diaphoresis, activity change, appetite change, fatigue and unexpected weight change.  HENT: Negative for congestion, sore throat, rhinorrhea, sneezing, trouble swallowing and sinus pressure.  Eyes: Negative for photophobia and visual disturbance.  Respiratory: occ. Cough,but no chest tightness, shortness of breath, wheezing and stridor.  Cardiovascular: Negative for chest pain,  palpitations and leg swelling.  Gastrointestinal: Negative for nausea, vomiting, abdominal pain, diarrhea, constipation, blood in stool, abdominal distention and anal bleeding.  Genitourinary: Negative for dysuria, hematuria, flank pain and difficulty urinating.  Musculoskeletal: Negative for myalgias, back pain, joint swelling, arthralgias and gait problem.  Skin: Negative for color change, pallor, rash and wound.  Neurological: Negative for dizziness, tremors, weakness and light-headedness.  Hematological: Negative for adenopathy. Does not bruise/bleed easily.  Psychiatric/Behavioral: Negative for behavioral problems, confusion, sleep disturbance, dysphoric mood, decreased concentration and agitation.    Physical Exam   BP (!) 171/92   Pulse (!) 55   Temp 98 F (36.7 C)   Ht 5\' 9"  (1.753 m)   Wt 144 lb 8 oz (65.5 kg)   BMI 21.34 kg/m   Physical Exam  Constitutional: He is oriented to person, place, and time. He appears well-developed and well-nourished. No distress.  HENT:  Mouth/Throat: Oropharynx is clear and moist. No oropharyngeal exudate.  Cardiovascular: Normal rate, regular rhythm and normal heart sounds. Exam reveals no gallop and no friction rub.  No murmur heard.  Pulmonary/Chest: Effort normal and breath sounds normal. No respiratory distress. He has no wheezes.  Lymphadenopathy:  He has no cervical adenopathy.  Neurological: He is alert and oriented to person, place, and time.  Skin: Skin is warm and dry. No rash noted. No erythema.  Psychiatric: He has a normal mood and affect. His behavior is normal.    CBC Lab Results  Component Value Date   WBC 5.5 08/20/2019   RBC 4.65 08/20/2019   HGB 13.9 08/20/2019   HCT 42.8 08/20/2019   PLT 109 (L) 08/20/2019   MCV  92.0 08/20/2019   MCH 29.9 08/20/2019   MCHC 32.5 08/20/2019   RDW 12.5 08/20/2019   LYMPHSABS 1.0 01/28/2014   MONOABS 0.5 01/28/2014   EOSABS 0.3 01/28/2014    BMET Lab Results  Component Value  Date   NA 140 08/20/2019   K 4.6 08/20/2019   CL 105 08/20/2019   CO2 29 08/20/2019   GLUCOSE 106 (H) 08/20/2019   BUN 25 08/20/2019   CREATININE 1.10 08/20/2019   CALCIUM 9.3 08/20/2019   GFRNONAA 59 (L) 01/28/2014   GFRAA 68 01/28/2014      Assessment and Plan Hx of m.abscessus colonization = from pulmonary standpoint appears stable. He is amenable to continue to monitor for worsening symptoms. For now hold off on treatment.  Bronchiectasis = use flutter valve if he is not singing as much. I suspect his routine for singing and overall exercise has been helpful for clearing sputum.  Next visit- Lets 6 months

## 2019-10-22 ENCOUNTER — Other Ambulatory Visit: Payer: Self-pay | Admitting: *Deleted

## 2019-10-22 DIAGNOSIS — I712 Thoracic aortic aneurysm, without rupture, unspecified: Secondary | ICD-10-CM

## 2019-11-30 ENCOUNTER — Other Ambulatory Visit: Payer: Self-pay

## 2019-11-30 ENCOUNTER — Encounter: Payer: Self-pay | Admitting: Cardiothoracic Surgery

## 2019-11-30 ENCOUNTER — Ambulatory Visit
Admission: RE | Admit: 2019-11-30 | Discharge: 2019-11-30 | Disposition: A | Discharge status: Home / Self Care | Payer: Medicare PPO | Source: Ambulatory Visit | Attending: Cardiothoracic Surgery | Admitting: Cardiothoracic Surgery

## 2019-11-30 ENCOUNTER — Ambulatory Visit: Payer: Medicare PPO | Admitting: Cardiothoracic Surgery

## 2019-11-30 VITALS — BP 170/100 | HR 59 | Resp 18 | Ht 69.0 in | Wt 144.6 lb

## 2019-11-30 DIAGNOSIS — I712 Thoracic aortic aneurysm, without rupture, unspecified: Secondary | ICD-10-CM

## 2019-11-30 MED ORDER — IOPAMIDOL (ISOVUE-370) INJECTION 76%
75.0000 mL | Freq: Once | INTRAVENOUS | Status: AC | PRN
  Administered 2019-11-30: 75 mL via INTRAVENOUS

## 2019-12-11 NOTE — Progress Notes (Signed)
Subjective: 85 year old man presents for 107-month surveillance following a sending aortic aneurysm.  He has been previously followed by Dr. Maren Beach.  In the time since he is last been seen he reports no chest pain or shortness of breath.  He continues to exercise frequently.  His blood pressure is well controlled  Objective: Physical exam:BP (!) 170/100 (BP Location: Right Arm, Patient Position: Sitting)   Pulse (!) 59   Resp 18   Ht 5\' 9"  (1.753 m)   Wt 65.6 kg   SpO2 96% Comment: RA with mask on  BMI 21.35 kg/m  Well-appearing elderly man no acute distress Clear to auscultation bilaterally Regular rate and rhythm Pulses intact throughout No peripheral edema  Imaging: I have personally reviewed his available imaging studies including CT scan of the chest from today which demonstrates a subfour 0.5 cm ascending aortic aneurysm that is stable in diameter  Assessment: Stable ascending aortic aneurysm.  Plan: Follow-up in another 18 months.  I have discussed with the patient the notion that eventually serial examinations is not worth doing.  The rationale for this is that it has been stable and the older he gets the less likely we are to subject him to elective surgery.  Jaycee Mckellips Z. , MD [phone]

## 2019-12-21 DIAGNOSIS — H401233 Low-tension glaucoma, bilateral, severe stage: Secondary | ICD-10-CM | POA: Diagnosis not present

## 2019-12-21 DIAGNOSIS — Z961 Presence of intraocular lens: Secondary | ICD-10-CM | POA: Diagnosis not present

## 2019-12-21 DIAGNOSIS — H43812 Vitreous degeneration, left eye: Secondary | ICD-10-CM | POA: Diagnosis not present

## 2019-12-21 DIAGNOSIS — Z9889 Other specified postprocedural states: Secondary | ICD-10-CM | POA: Diagnosis not present

## 2020-02-02 DIAGNOSIS — Z9889 Other specified postprocedural states: Secondary | ICD-10-CM | POA: Diagnosis not present

## 2020-02-02 DIAGNOSIS — H43812 Vitreous degeneration, left eye: Secondary | ICD-10-CM | POA: Diagnosis not present

## 2020-02-02 DIAGNOSIS — H401233 Low-tension glaucoma, bilateral, severe stage: Secondary | ICD-10-CM | POA: Diagnosis not present

## 2020-02-02 DIAGNOSIS — Z961 Presence of intraocular lens: Secondary | ICD-10-CM | POA: Diagnosis not present

## 2020-04-04 DIAGNOSIS — L570 Actinic keratosis: Secondary | ICD-10-CM | POA: Diagnosis not present

## 2020-04-04 DIAGNOSIS — Z85828 Personal history of other malignant neoplasm of skin: Secondary | ICD-10-CM | POA: Diagnosis not present

## 2020-04-04 DIAGNOSIS — L565 Disseminated superficial actinic porokeratosis (DSAP): Secondary | ICD-10-CM | POA: Diagnosis not present

## 2020-04-04 DIAGNOSIS — L821 Other seborrheic keratosis: Secondary | ICD-10-CM | POA: Diagnosis not present

## 2020-04-12 ENCOUNTER — Ambulatory Visit: Payer: Medicare PPO | Admitting: Internal Medicine

## 2020-04-12 ENCOUNTER — Other Ambulatory Visit: Payer: Self-pay

## 2020-04-12 VITALS — BP 165/85 | HR 57 | Temp 98.0°F | Ht 69.0 in | Wt 147.0 lb

## 2020-04-12 DIAGNOSIS — J479 Bronchiectasis, uncomplicated: Secondary | ICD-10-CM | POA: Diagnosis not present

## 2020-04-12 DIAGNOSIS — A319 Mycobacterial infection, unspecified: Secondary | ICD-10-CM

## 2020-04-12 DIAGNOSIS — A318 Other mycobacterial infections: Secondary | ICD-10-CM

## 2020-04-12 NOTE — Progress Notes (Signed)
RFV: follow up for m.abscessus lung disease/bronchiectasis  Patient ID: Danny Gross, male   DOB: [birth year], 86 y.o.   MRN: [id number]  HPI  86yo M with history of bronchiectasis and m.abscessus pulmonary colonization. He is asymptomatic from pulmonary standpoint. He reports that he received their 2nd booster. Of late, has ahd some Eyes bilateral redness - glaucoma eye drops only. Now On 3 different eye drops  Takes ibufprofen arthritis  Occasional dry cough. ( has 3 coughs at bedtime and otherwise sleeps well)  In terms of his blood pressure, he checks nearly daily, and usually runs sbp is 137/80s -- on most days  He keeps busy by starting [employer] for gardening getting started.  Outpatient Encounter Medications as of 04/12/2020  Medication Sig  . brimonidine-timolol (COMBIGAN) 0.2-0.5 % ophthalmic solution Place 1 drop into both eyes every 12 (twelve) hours.  Marland Kitchen co-enzyme Q-10 30 MG capsule Take 30 mg by mouth daily.  . dorzolamide (TRUSOPT) 2 % ophthalmic solution Place 1 drop into both eyes 2 (two) times daily.  Marland Kitchen guaiFENesin (MUCINEX) 600 MG 12 hr tablet Take 600 mg by mouth 2 (two) times daily as needed.  . latanoprost (XALATAN) 0.005 % ophthalmic solution Place 1 drop into both eyes at bedtime.   No facility-administered encounter medications on file as of 04/12/2020.     Patient Active Problem List   Diagnosis Date Noted  . Bronchiectasis without complication (HCC) 04/22/2018  . Mycobacterium abscessus infection 04/22/2018  . Impaired fasting glucose 10/04/2014  . Routine general medical examination at a health care facility 12/21/2013  . Pulmonary infiltrates 07/13/2013     Health Maintenance Due  Topic Date Due  . TETANUS/TDAP  01/09/2020     Review of Systems Review of Systems  Constitutional: Negative for fever, chills, diaphoresis, activity change, appetite change, fatigue and unexpected weight change.  HENT: Negative for congestion, sore throat,  rhinorrhea, sneezing, trouble swallowing and sinus pressure.  Eyes: Negative for photophobia and visual disturbance.  Respiratory: Negative for cough, chest tightness, shortness of breath, wheezing and stridor.  Cardiovascular: Negative for chest pain, palpitations and leg swelling.  Gastrointestinal: Negative for nausea, vomiting, abdominal pain, diarrhea, constipation, blood in stool, abdominal distention and anal bleeding.  Genitourinary: Negative for dysuria, hematuria, flank pain and difficulty urinating.  Musculoskeletal: Negative for myalgias, back pain, joint swelling, arthralgias and gait problem.  Skin: Negative for color change, pallor, rash and wound.  Neurological: Negative for dizziness, tremors, weakness and light-headedness.  Hematological: Negative for adenopathy. Does not bruise/bleed easily.  Psychiatric/Behavioral: Negative for behavioral problems, confusion, sleep disturbance, dysphoric mood, decreased concentration and agitation.    Physical Exam   BP (!) 165/85   Pulse (!) 57   Temp 98 F (36.7 C)   Ht 5\' 9"  (1.753 m)   Wt 147 lb (66.7 kg)   BMI 21.71 kg/m   Physical Exam  Constitutional: He is oriented to person, place, and time. He appears well-developed and well-nourished. No distress.  HENT:  Mouth/Throat: Oropharynx is clear and moist. No oropharyngeal exudate.  Cardiovascular: Normal rate, regular rhythm and normal heart sounds. Exam reveals no gallop and no friction rub.  No murmur heard.  Pulmonary/Chest: Effort normal and breath sounds normal. No respiratory distress. He has no wheezes.  Lymphadenopathy:  He has no cervical adenopathy.  Neurological: He is alert and oriented to person, place, and time.  Skin: Skin is warm and dry. No rash noted. No erythema.  Psychiatric: He has a normal mood and affect.  His behavior is normal.    CBC Lab Results  Component Value Date   WBC 5.5 08/20/2019   RBC 4.65 08/20/2019   HGB 13.9 08/20/2019   HCT 42.8  08/20/2019   PLT 109 (L) 08/20/2019   MCV 92.0 08/20/2019   MCH 29.9 08/20/2019   MCHC 32.5 08/20/2019   RDW 12.5 08/20/2019   LYMPHSABS 1.0 01/28/2014   MONOABS 0.5 01/28/2014   EOSABS 0.3 01/28/2014    BMET Lab Results  Component Value Date   NA 140 08/20/2019   K 4.6 08/20/2019   CL 105 08/20/2019   CO2 29 08/20/2019   GLUCOSE 106 (H) 08/20/2019   BUN 25 08/20/2019   CREATININE 1.10 08/20/2019   CALCIUM 9.3 08/20/2019   GFRNONAA 59 (L) 01/28/2014   GFRAA 68 01/28/2014    Assessment and Plan Pulmonary bronchiectasis = continues to sing and do diaphragm exercise likely doesn't need flutter valve as much No signs of exacerbation at this time. No need for repeat imaging at this time.  Would not treat for m.abscessus  Hypertension = likely "white coat syndrome" at this visit  History of thoracic aneurysm = unchanged on last imaging in oct 2021  Health maintenance = up to date    -

## 2020-04-19 DIAGNOSIS — H401133 Primary open-angle glaucoma, bilateral, severe stage: Secondary | ICD-10-CM | POA: Diagnosis not present

## 2020-05-31 DIAGNOSIS — Z9889 Other specified postprocedural states: Secondary | ICD-10-CM | POA: Diagnosis not present

## 2020-05-31 DIAGNOSIS — H401233 Low-tension glaucoma, bilateral, severe stage: Secondary | ICD-10-CM | POA: Diagnosis not present

## 2020-05-31 DIAGNOSIS — H43812 Vitreous degeneration, left eye: Secondary | ICD-10-CM | POA: Diagnosis not present

## 2020-05-31 DIAGNOSIS — Z961 Presence of intraocular lens: Secondary | ICD-10-CM | POA: Diagnosis not present

## 2020-07-13 DIAGNOSIS — H401233 Low-tension glaucoma, bilateral, severe stage: Secondary | ICD-10-CM | POA: Diagnosis not present

## 2020-07-13 DIAGNOSIS — H1131 Conjunctival hemorrhage, right eye: Secondary | ICD-10-CM | POA: Diagnosis not present

## 2020-07-13 DIAGNOSIS — Z961 Presence of intraocular lens: Secondary | ICD-10-CM | POA: Diagnosis not present

## 2020-07-19 ENCOUNTER — Encounter: Payer: Self-pay | Admitting: Internal Medicine

## 2020-07-19 ENCOUNTER — Ambulatory Visit: Payer: Medicare PPO | Admitting: Internal Medicine

## 2020-07-19 ENCOUNTER — Other Ambulatory Visit: Payer: Self-pay

## 2020-07-19 VITALS — BP 124/80 | HR 61 | Temp 97.9°F | Resp 18 | Ht 69.0 in | Wt 144.2 lb

## 2020-07-19 DIAGNOSIS — R1900 Intra-abdominal and pelvic swelling, mass and lump, unspecified site: Secondary | ICD-10-CM

## 2020-07-19 DIAGNOSIS — R7301 Impaired fasting glucose: Secondary | ICD-10-CM

## 2020-07-19 DIAGNOSIS — I700 Atherosclerosis of aorta: Secondary | ICD-10-CM

## 2020-07-19 LAB — COMPREHENSIVE METABOLIC PANEL
ALT: 12 U/L (ref 0–53)
AST: 17 U/L (ref 0–37)
Albumin: 4.2 g/dL (ref 3.5–5.2)
Alkaline Phosphatase: 102 U/L (ref 39–117)
BUN: 32 mg/dL — ABNORMAL HIGH (ref 6–23)
CO2: 28 mEq/L (ref 19–32)
Calcium: 9.7 mg/dL (ref 8.4–10.5)
Chloride: 103 mEq/L (ref 96–112)
Creatinine, Ser: 1.2 mg/dL (ref 0.40–1.50)
GFR: 55.77 mL/min — ABNORMAL LOW (ref >60.00)
Glucose, Bld: 116 mg/dL — ABNORMAL HIGH (ref 70–99)
Potassium: 4.4 mEq/L (ref 3.5–5.1)
Sodium: 138 mEq/L (ref 135–145)
Total Bilirubin: 0.9 mg/dL (ref 0.2–1.2)
Total Protein: 7 g/dL (ref 6.0–8.3)

## 2020-07-19 LAB — LIPID PANEL
Cholesterol: 200 mg/dL (ref 0–200)
HDL: 59.7 mg/dL (ref >39.00)
LDL Cholesterol: 125 mg/dL — ABNORMAL HIGH (ref 0–99)
NonHDL: 139.91
Total CHOL/HDL Ratio: 3
Triglycerides: 74 mg/dL (ref 0.0–149.0)
VLDL: 14.8 mg/dL (ref 0.0–40.0)

## 2020-07-19 LAB — CBC
HCT: 42.3 % (ref 39.0–52.0)
Hemoglobin: 13.8 g/dL (ref 13.0–17.0)
MCHC: 32.7 g/dL (ref 30.0–36.0)
MCV: 89.3 fl (ref 78.0–100.0)
Platelets: 103 10*3/uL — ABNORMAL LOW (ref 150.0–400.0)
RBC: 4.74 Mil/uL (ref 4.22–5.81)
RDW: 13.6 % (ref 11.5–15.5)
WBC: 5.5 10*3/uL (ref 4.0–10.5)

## 2020-07-19 LAB — HEMOGLOBIN A1C: Hgb A1c MFr Bld: 6.1 % (ref 4.6–6.5)

## 2020-07-19 NOTE — Patient Instructions (Signed)
We will check the ultrasound of the stomach and the labs.

## 2020-07-19 NOTE — Assessment & Plan Note (Addendum)
Noted on recent CT chest 11/2019. He is not on statin and counseled about this today. Ordered CT cardiac calcium score to help [place] decide if he needs medication or not. If this score is elevated he is willing to start statin.

## 2020-07-19 NOTE — Progress Notes (Signed)
   Subjective:   Patient ID: Danny Gross, male    DOB: 08-Jul-1934, 86 y.o.   MRN: [id number]  HPI The patient is an 86 YO man coming in for concerns about swelling in his abdomen. He was searching online and found gaucher disease and there is some Ashkenazi in his background. Denies constipation or diarrhea. Denies abdominal pain. No food triggers he has identified.   Review of Systems  Constitutional: Negative.   HENT: Negative.    Eyes: Negative.   Respiratory:  Negative for cough, chest tightness and shortness of breath.   Cardiovascular:  Negative for chest pain, palpitations and leg swelling.  Gastrointestinal:  Positive for abdominal distention. Negative for abdominal pain, constipation, diarrhea, nausea and vomiting.  Musculoskeletal: Negative.   Skin: Negative.   Neurological: Negative.   Psychiatric/Behavioral: Negative.     Objective:  Physical Exam Constitutional:      Appearance: He is well-developed.  HENT:     Head: Normocephalic and atraumatic.  Cardiovascular:     Rate and Rhythm: Normal rate and regular rhythm.  Pulmonary:     Effort: Pulmonary effort is normal. No respiratory distress.     Breath sounds: Rhonchi present. No wheezing or rales.     Comments: At baseline Abdominal:     General: Bowel sounds are normal. There is distension.     Palpations: Abdomen is soft.     Tenderness: There is no abdominal tenderness. There is no rebound.  Musculoskeletal:     Cervical back: Normal range of motion.  Skin:    General: Skin is warm and dry.  Neurological:     Mental Status: He is alert and oriented to person, place, and time.     Coordination: Coordination normal.    Vitals:   07/19/20 1043  BP: 124/80  Pulse: 61  Resp: 18  Temp: 97.9 F (36.6 C)  TempSrc: Oral  SpO2: 97%  Weight: 144 lb 3.2 oz (65.4 kg)  Height: 5\' 9"  (1.753 m)    This visit occurred during the SARS-CoV-2 public health emergency.  Safety protocols were in place, including  screening questions prior to the visit, additional usage of staff PPE, and extensive cleaning of exam room while observing appropriate contact time as indicated for disinfecting solutions.   Assessment & Plan:

## 2020-07-20 ENCOUNTER — Ambulatory Visit (HOSPITAL_COMMUNITY)
Admission: RE | Admit: 2020-07-20 | Discharge: 2020-07-20 | Disposition: A | Discharge status: Home / Self Care | Payer: Medicare PPO | Source: Ambulatory Visit | Attending: Internal Medicine | Admitting: Internal Medicine

## 2020-07-20 DIAGNOSIS — Z136 Encounter for screening for cardiovascular disorders: Secondary | ICD-10-CM | POA: Insufficient documentation

## 2020-07-20 DIAGNOSIS — I700 Atherosclerosis of aorta: Secondary | ICD-10-CM | POA: Insufficient documentation

## 2020-07-22 DIAGNOSIS — R1900 Intra-abdominal and pelvic swelling, mass and lump, unspecified site: Secondary | ICD-10-CM | POA: Insufficient documentation

## 2020-07-22 NOTE — Assessment & Plan Note (Signed)
Checking CBC and CMP and US abdomen to assess. We did discuss that typically with gaucher disease this is caught in the first several years of life due to severity.

## 2020-07-22 NOTE — Assessment & Plan Note (Signed)
Needs follow up and checking HgA1c today.

## 2020-07-31 DIAGNOSIS — W5501XA Bitten by cat, initial encounter: Secondary | ICD-10-CM | POA: Diagnosis not present

## 2020-07-31 DIAGNOSIS — L03114 Cellulitis of left upper limb: Secondary | ICD-10-CM | POA: Diagnosis not present

## 2020-08-03 ENCOUNTER — Ambulatory Visit
Admission: RE | Admit: 2020-08-03 | Discharge: 2020-08-03 | Disposition: A | Discharge status: Home / Self Care | Payer: Medicare PPO | Source: Ambulatory Visit | Attending: Internal Medicine | Admitting: Internal Medicine

## 2020-08-03 DIAGNOSIS — R160 Hepatomegaly, not elsewhere classified: Secondary | ICD-10-CM | POA: Diagnosis not present

## 2020-08-03 DIAGNOSIS — K828 Other specified diseases of gallbladder: Secondary | ICD-10-CM | POA: Diagnosis not present

## 2020-08-03 DIAGNOSIS — K8020 Calculus of gallbladder without cholecystitis without obstruction: Secondary | ICD-10-CM | POA: Diagnosis not present

## 2020-08-03 DIAGNOSIS — N281 Cyst of kidney, acquired: Secondary | ICD-10-CM | POA: Diagnosis not present

## 2020-08-03 DIAGNOSIS — R1900 Intra-abdominal and pelvic swelling, mass and lump, unspecified site: Secondary | ICD-10-CM

## 2020-10-03 DIAGNOSIS — H43812 Vitreous degeneration, left eye: Secondary | ICD-10-CM | POA: Diagnosis not present

## 2020-10-03 DIAGNOSIS — H1132 Conjunctival hemorrhage, left eye: Secondary | ICD-10-CM | POA: Diagnosis not present

## 2020-10-03 DIAGNOSIS — H1131 Conjunctival hemorrhage, right eye: Secondary | ICD-10-CM | POA: Diagnosis not present

## 2020-10-03 DIAGNOSIS — H1133 Conjunctival hemorrhage, bilateral: Secondary | ICD-10-CM | POA: Diagnosis not present

## 2020-10-03 DIAGNOSIS — Z9889 Other specified postprocedural states: Secondary | ICD-10-CM | POA: Diagnosis not present

## 2020-10-03 DIAGNOSIS — Z961 Presence of intraocular lens: Secondary | ICD-10-CM | POA: Diagnosis not present

## 2020-10-03 DIAGNOSIS — H401233 Low-tension glaucoma, bilateral, severe stage: Secondary | ICD-10-CM | POA: Diagnosis not present

## 2020-10-05 DIAGNOSIS — Z85828 Personal history of other malignant neoplasm of skin: Secondary | ICD-10-CM | POA: Diagnosis not present

## 2020-10-05 DIAGNOSIS — D692 Other nonthrombocytopenic purpura: Secondary | ICD-10-CM | POA: Diagnosis not present

## 2020-10-05 DIAGNOSIS — D1801 Hemangioma of skin and subcutaneous tissue: Secondary | ICD-10-CM | POA: Diagnosis not present

## 2020-10-05 DIAGNOSIS — L812 Freckles: Secondary | ICD-10-CM | POA: Diagnosis not present

## 2020-10-05 DIAGNOSIS — L821 Other seborrheic keratosis: Secondary | ICD-10-CM | POA: Diagnosis not present

## 2020-10-05 DIAGNOSIS — L570 Actinic keratosis: Secondary | ICD-10-CM | POA: Diagnosis not present

## 2020-10-10 DIAGNOSIS — G8929 Other chronic pain: Secondary | ICD-10-CM | POA: Diagnosis not present

## 2020-10-10 DIAGNOSIS — R030 Elevated blood-pressure reading, without diagnosis of hypertension: Secondary | ICD-10-CM | POA: Diagnosis not present

## 2020-10-10 DIAGNOSIS — M1990 Unspecified osteoarthritis, unspecified site: Secondary | ICD-10-CM | POA: Diagnosis not present

## 2020-10-10 DIAGNOSIS — J479 Bronchiectasis, uncomplicated: Secondary | ICD-10-CM | POA: Diagnosis not present

## 2020-10-10 DIAGNOSIS — Z8249 Family history of ischemic heart disease and other diseases of the circulatory system: Secondary | ICD-10-CM | POA: Diagnosis not present

## 2020-10-10 DIAGNOSIS — Z7722 Contact with and (suspected) exposure to environmental tobacco smoke (acute) (chronic): Secondary | ICD-10-CM | POA: Diagnosis not present

## 2020-10-10 DIAGNOSIS — H409 Unspecified glaucoma: Secondary | ICD-10-CM | POA: Diagnosis not present

## 2020-10-12 ENCOUNTER — Ambulatory Visit: Payer: Medicare PPO | Admitting: Internal Medicine

## 2020-10-19 ENCOUNTER — Encounter: Payer: Self-pay | Admitting: Internal Medicine

## 2020-10-20 ENCOUNTER — Telehealth (INDEPENDENT_AMBULATORY_CARE_PROVIDER_SITE_OTHER): Payer: Medicare PPO | Admitting: Family Medicine

## 2020-10-20 ENCOUNTER — Ambulatory Visit (INDEPENDENT_AMBULATORY_CARE_PROVIDER_SITE_OTHER): Payer: Medicare PPO | Admitting: Internal Medicine

## 2020-10-20 ENCOUNTER — Encounter: Payer: Self-pay | Admitting: Family Medicine

## 2020-10-20 ENCOUNTER — Other Ambulatory Visit: Payer: Self-pay

## 2020-10-20 DIAGNOSIS — U071 COVID-19: Secondary | ICD-10-CM | POA: Diagnosis not present

## 2020-10-20 DIAGNOSIS — J479 Bronchiectasis, uncomplicated: Secondary | ICD-10-CM | POA: Diagnosis not present

## 2020-10-20 MED ORDER — BENZONATATE 100 MG PO CAPS: 100.0000 mg | ORAL_CAPSULE | Freq: Three times a day (TID) | ORAL | 0 refills | Status: DC | PRN

## 2020-10-20 MED ORDER — NIRMATRELVIR/RITONAVIR (PAXLOVID) TABLET (RENAL DOSING): 2.0000 | ORAL_TABLET | Freq: Two times a day (BID) | ORAL | 0 refills | Status: AC

## 2020-10-20 NOTE — Progress Notes (Signed)
Virtual Visit via Video Note  I connected with Danny Gross  on 10/20/20 at 10:40 AM EDT by a video enabled telemedicine application and verified that I am speaking with the correct person using two identifiers.  Location patient: home, [place] Location provider:work or home office Persons participating in the virtual visit: patient, provider, patient's wife  I discussed the limitations of evaluation and management by telemedicine and the availability of in person appointments. The patient expressed understanding and agreed to proceed.   HPI:  Acute telemedicine visit for Covid19: -Onset: yesterday; wife and him both tested positive for covid on home testing -Symptoms include: feels a bit tired, has some nasal congestion, pnd, cough, low grade fever -Denies:CP, SOB, NVD, inability to eat/drink/get out of bed -Has tried:musinex -Pertinent past medical history:see below -Pertinent medication allergies: No Known Allergies -GFR was 55 done in July -COVID-19 vaccine status: Immunization History  Administered Date(s) Administered   Fluad Quad(high Dose 65+) 10/23/2018   Influenza Split 10/08/2012   Influenza, High Dose Seasonal PF 10/04/2014   Influenza,inj,Quad PF,6+ Mos 09/18/2013   Influenza-Unspecified 10/22/2015, 10/12/2016, 10/15/2017   PFIZER Comirnaty(Gray Top)Covid-19 Tri-Sucrose Vaccine 03/01/2020, 07/28/2020   PFIZER(Purple Top)SARS-COV-2 Vaccination 02/02/2019, 02/20/2019   Pneumococcal Conjugate-13 07/27/2013   Pneumococcal Polysaccharide-23 10/04/2014   Tdap 01/08/2010   Zoster Recombinat (Shingrix) 06/19/2016, 11/02/2016   Zoster, Live 01/08/2006  Has 3 covid boosters per report  ROS: See pertinent positives and negatives per HPI.  Past Medical History:  Diagnosis Date   Cataract    Emphysema of lung [facility])     Past Surgical History:  Procedure Laterality Date   HERNIA REPAIR  2003   TONSILLECTOMY  1939   VIDEO BRONCHOSCOPY Bilateral 08/11/2013   Procedure: VIDEO  BRONCHOSCOPY WITH FLUORO;  Surgeon: Nyoka Cowden, MD;  Location: [facility];  Service: Cardiopulmonary;  Laterality: Bilateral;     Current Outpatient Medications:    benzonatate (TESSALON PERLES) 100 MG capsule, Take 1 capsule (100 mg total) by mouth 3 (three) times daily as needed., Disp: 20 capsule, Rfl: 0   brimonidine-timolol (COMBIGAN) 0.2-0.5 % ophthalmic solution, Place 1 drop into both eyes every 12 (twelve) hours., Disp: , Rfl:    co-enzyme Q-10 30 MG capsule, Take 30 mg by mouth daily., Disp: , Rfl:    dorzolamide (TRUSOPT) 2 % ophthalmic solution, Place 1 drop into both eyes 2 (two) times daily., Disp: , Rfl:    guaiFENesin (MUCINEX) 600 MG 12 hr tablet, Take 600 mg by mouth 2 (two) times daily as needed., Disp: , Rfl:    nirmatrelvir/ritonavir EUA, renal dosing, (PAXLOVID) 10 x 150 MG & 10 x 100MG  TABS, Take 2 tablets by mouth 2 (two) times daily for 5 days. (Take nirmatrelvir 150 mg one tablet twice daily for 5 days and ritonavir 100 mg one tablet twice daily for 5 days) Patient GFR is 55 in July, Disp: 20 tablet, Rfl: 0  EXAM:  VITALS per patient if applicable:  GENERAL: alert, oriented, appears well and in no acute distress  HEENT: atraumatic, conjunttiva clear, no obvious abnormalities on inspection of external nose and ears  NECK: normal movements of the head and neck  LUNGS: on inspection no signs of respiratory distress, breathing rate appears normal, no obvious gross SOB, gasping or wheezing  CV: no obvious cyanosis  MS: moves all visible extremities without noticeable abnormality  PSYCH/NEURO: pleasant and cooperative, no obvious depression or anxiety, speech and thought processing grossly intact  ASSESSMENT AND PLAN:  Discussed the following assessment and plan:  COVID-19   Discussed treatment options (infusions and oral options and risk of drug interactions), ideal treatment window, potential complications, isolation and precautions for COVID-19.   Discussed possibility of rebound with antivirals and the need to reisolate if it should occur for 5 days. Checked for/reviewed any labs done in the last 90 days with GFR listed in HPI if available. After lengthy discussion, the patient opted for treatment with Paxlovid (renally dosed per labs) due to being higher risk for complications of covid or severe disease and other factors. Discussed EUA status of this drug and the fact that there is preliminary limited knowledge of risks/interactions/side effects per EUA document vs possible benefits and precautions. This information was shared with patient during the visit and also was provided in patient instructions. Also, advised that patient discuss risks/interactions and use with pharmacist/treatment team as well.  The patient did want a prescription for cough, Tessalon Rx sent.  Other symptomatic care measures summarized in patient instructions. Advised to seek prompt in person care if worsening, new symptoms arise, or if is not improving with treatment. Discussed options for inperson care if PCP office not available. Did let this patient know that I only do telemedicine on Tuesdays and Thursdays for [place]. Advised to schedule follow up visit with PCP or [facility] if any further questions or concerns to avoid delays in care.   I discussed the assessment and treatment plan with the patient. The patient was provided an opportunity to ask questions and all were answered. The patient agreed with the plan and demonstrated an understanding of the instructions.     Terressa Koyanagi, DO

## 2020-10-20 NOTE — Progress Notes (Signed)
  Virtual Visit via Telephone Note  I connected with Danny Gross on 12/03/20 at  3:15 PM EDT by telephone and verified that I am speaking with the correct person using two identifiers.  Location: Patient: at home Provider: at clinic   I discussed the limitations, risks, security and privacy concerns of performing an evaluation and management service by telephone and the availability of in person appointments. I also discussed with the patient that there may be a patient responsible charge related to this service. The patient expressed understanding and agreed to proceed.   History of Present Illness: 86yo M with history of bronchiectasis, colonization with pulmonary m.abscessus, not treated, who was just diagnosed with covid-1 disease. "Don't feel too bad". Started feeling poorly yesterday. + antigen test. Just started taking paxlovid. Went running last night. He thinks he got it from his wife(since Saturday +)  Also just finished singing with [employer]ber [employer] -at [facility] nursing homes.   Outpatient Encounter Medications as of 10/20/2020  Medication Sig   benzonatate (TESSALON PERLES) 100 MG capsule Take 1 capsule (100 mg total) by mouth 3 (three) times daily as needed.   brimonidine-timolol (COMBIGAN) 0.2-0.5 % ophthalmic solution Place 1 drop into both eyes every 12 (twelve) hours.   co-enzyme Q-10 30 MG capsule Take 30 mg by mouth daily.   dorzolamide (TRUSOPT) 2 % ophthalmic solution Place 1 drop into both eyes 2 (two) times daily.   guaiFENesin (MUCINEX) 600 MG 12 hr tablet Take 600 mg by mouth 2 (two) times daily as needed.   nirmatrelvir/ritonavir EUA, renal dosing, (PAXLOVID) 10 x 150 MG & 10 x 100MG  TABS Take 2 tablets by mouth 2 (two) times daily for 5 days. (Take nirmatrelvir 150 mg one tablet twice daily for 5 days and ritonavir 100 mg one tablet twice daily for 5 days) Patient GFR is 55 in July   No facility-administered encounter medications on file as of 10/20/2020.    Assessment  and Plan & Follow Up Instructions: finish course of paxlovid, watch for rebound. Inform any close contacts. Rest, hydrate, can take antipyretics if needed for fevers.    I discussed the assessment and treatment plan with the patient. The patient was provided an opportunity to ask questions and all were answered. The patient agreed with the plan and demonstrated an understanding of the instructions.   The patient was advised to call back or seek an in-person evaluation if the symptoms worsen or if the condition fails to improve as anticipated.  I provided 10 minutes of non-face-to-face time during this encounter.   10/22/2020, MD

## 2020-10-20 NOTE — Patient Instructions (Addendum)
HOME CARE TIPS:   -I sent the medication(s) we discussed to your pharmacy: Meds ordered this encounter  Medications   nirmatrelvir/ritonavir EUA, renal dosing, (PAXLOVID) 10 x 150 MG & 10 x $Re'100MG'IRV$  TABS    Sig: Take 2 tablets by mouth 2 (two) times daily for 5 days. (Take nirmatrelvir 150 mg one tablet twice daily for 5 days and ritonavir 100 mg one tablet twice daily for 5 days) Patient GFR is 55 in July    Dispense:  20 tablet    Refill:  0   benzonatate (TESSALON PERLES) 100 MG capsule    Sig: Take 1 capsule (100 mg total) by mouth 3 (three) times daily as needed.    Dispense:  20 capsule    Refill:  0     -I sent in the [facility] treatment or referral you requested per our discussion. Please see the information provided below and discuss further with the pharmacist/treatment team.  -If taking Paxlovid, please review all medications, supplement and over the counter drugs with your pharmacist and ask them to check for any interactions.   -there is a chance of rebound illness after finishing your treatment. If you become sick again please isolate for an additional 5 days, plus 5 more days of masking.   -can use tylenol if needed for fevers, aches and pains per instructions  -can use nasal saline a few times per day if you have nasal congestion  -stay hydrated, drink plenty of fluids and eat small healthy meals - avoid dairy  -If the Covid test is positive, check out the [facility] website for more information on home care, transmission and treatment for COVID19  -follow up with your doctor in 2-3 days unless improving and feeling better  -stay home while sick, except to seek medical care. If you have COVID19, ideally it would be best to stay home for a full 10 days since the onset of symptoms PLUS one day of no fever and feeling better. Wear a good mask that fits snugly (such as N95 or KN95) if around others to reduce the risk of transmission.  It was nice to meet you today, and I really  hope you are feeling better soon. I help [facility] out with telemedicine visits on Tuesdays and Thursdays and am available for visits on those days. If you have any concerns or questions following this visit please schedule a follow up visit with your Primary Care doctor or seek care at a local urgent care clinic to avoid delays in care.    Seek in person care or schedule a follow up video visit promptly if your symptoms worsen, new concerns arise or you are not improving with treatment. Call 911 and/or seek emergency care if your symptoms are severe or life threatening.  FACT SHEET FOR PATIENTS, PARENTS, AND CAREGIVERS EMERGENCY USE AUTHORIZATION (EUA) OF PAXLOVID FOR CORONAVIRUS DISEASE 2019 (COVID-19) You are being given this Fact Sheet because your healthcare provider believes it is necessary to provide you with PAXLOVID for the treatment of mild-to-moderate coronavirus disease (COVID-19) caused by the SARS-CoV-2 virus. This Fact Sheet contains information to help you understand the risks and benefits of taking the PAXLOVID you have received or may receive. The U.S. Food and Drug Administration (FDA) has issued an Emergency Use Authorization (EUA) to make PAXLOVID available during the COVID-19 pandemic (for more details about an EUA please see "What is an Emergency Use Authorization?" at the end of this document). PAXLOVID is not an FDA-approved medicine in  the [place]. Read this Fact Sheet for information about PAXLOVID. Talk to your healthcare provider about your options or if you have any questions. It is your choice to take PAXLOVID.  What is COVID-19? COVID-19 is caused by a virus called a coronavirus. You can get COVID-19 through close contact with another person who has the virus. COVID-19 illnesses have ranged from very mild-to-severe, including illness resulting in death. While information so far suggests that most COVID-19 illness is mild, serious illness can happen and  may cause some of your other medical conditions to become worse. Older people and people of all ages with severe, long lasting (chronic) medical conditions like heart disease, lung disease, and diabetes, for example seem to be at higher risk of being hospitalized for COVID-19.  What is PAXLOVID? PAXLOVID is an investigational medicine used to treat mild-to-moderate COVID-19 in adults and children [76 years of age and older weighing at least 84 pounds (59 kg)] with positive results of direct SARS-CoV-2 viral testing, and who are at high risk for progression to severe COVID-19, including hospitalization or death. PAXLOVID is investigational because it is still being studied. There is limited information about the safety and effectiveness of using PAXLOVID to treat people with mild-to-moderate COVID-19.  The FDA has authorized the emergency use of PAXLOVID for the treatment of mild-tomoderate COVID-19 in adults and children [20 years of age and older weighing at least 60 pounds (64 kg)] with a positive test for the virus that causes COVID-19, and who are at high risk for progression to severe COVID-19, including hospitalization or death, under an EUA. 1 Revised: 25 March 2020   What should I tell my healthcare provider before I take PAXLOVID? Tell your healthcare provider if you: ? Have any allergies ? Have liver or kidney disease ? Are pregnant or plan to become pregnant ? Are breastfeeding a child ? Have any serious illnesses  Tell your healthcare provider about all the medicines you take, including prescription and over-the-counter medicines, vitamins, and herbal supplements. Some medicines may interact with PAXLOVID and may cause serious side effects. Keep a list of your medicines to show your healthcare provider and pharmacist when you get a new medicine.  You can ask your healthcare provider or pharmacist for a list of medicines that interact with PAXLOVID. Do not start taking a  new medicine without telling your healthcare provider. Your healthcare provider can tell you if it is safe to take PAXLOVID with other medicines.  Tell your healthcare provider if you are taking combined hormonal contraceptive. PAXLOVID may affect how your birth control pills work. Females who are able to become pregnant should use another effective alternative form of contraception or an additional barrier method of contraception. Talk to your healthcare provider if you have any questions about contraceptive methods that might be right for you.  How do I take PAXLOVID? ? PAXLOVID consists of 2 medicines: nirmatrelvir and ritonavir. o Take 2 pink tablets of nirmatrelvir with 1 white tablet of ritonavir by mouth 2 times each day (in the morning and in the evening) for 5 days. For each dose, take all 3 tablets at the same time. o If you have kidney disease, talk to your healthcare provider. You may need a different dose. ? Swallow the tablets whole. Do not chew, break, or crush the tablets. ? Take PAXLOVID with or without food. ? Do not stop taking PAXLOVID without talking to your healthcare provider, even if you feel better. ? If you miss  a dose of PAXLOVID within 8 hours of the time it is usually taken, take it as soon as you remember. If you miss a dose by more than 8 hours, skip the missed dose and take the next dose at your regular time. Do not take 2 doses of PAXLOVID at the same time. ? If you take too much PAXLOVID, call your healthcare provider or go to the nearest hospital emergency room right away. ? If you are taking a ritonavir- or cobicistat-containing medicine to treat hepatitis C or Human Immunodeficiency Virus (HIV), you should continue to take your medicine as prescribed by your healthcare provider. 2 Revised: 25 March 2020    Talk to your healthcare provider if you do not feel better or if you feel worse after 5 days.  Who should generally not take PAXLOVID? Do  not take PAXLOVID if: ? You are allergic to nirmatrelvir, ritonavir, or any of the ingredients in PAXLOVID. ? You are taking any of the following medicines: o Alfuzosin o Pethidine, propoxyphene o Ranolazine o Amiodarone, dronedarone, flecainide, propafenone, quinidine o Colchicine o Lurasidone, pimozide, clozapine o Dihydroergotamine, ergotamine, methylergonovine o Lovastatin, simvastatin o Sildenafil (Revatio) for pulmonary arterial hypertension (PAH) o Triazolam, oral midazolam o Apalutamide o Carbamazepine, phenobarbital, phenytoin o Rifampin o St. John's Wort (hypericum perforatum) Taking PAXLOVID with these medicines may cause serious or life-threatening side effects or affect how PAXLOVID works.  These are not the only medicines that may cause serious side effects if taken with PAXLOVID. PAXLOVID may increase or decrease the levels of multiple other medicines. It is very important to tell your healthcare provider about all of the medicines you are taking because additional laboratory tests or changes in the dose of your other medicines may be necessary while you are taking PAXLOVID. Your healthcare provider may also tell you about specific symptoms to watch out for that may indicate that you need to stop or decrease the dose of some of your other medicines.  What are the important possible side effects of PAXLOVID? Possible side effects of PAXLOVID are: ? Allergic Reactions. Allergic reactions can happen in people taking PAXLOVID, even after only 1 dose. Stop taking PAXLOVID and call your healthcare provider right away if you get any of the following symptoms of an allergic reaction: o hives o trouble swallowing or breathing o swelling of the mouth, lips, or face o throat tightness o hoarseness 3 Revised: 25 March 2020  o skin rash ? Liver Problems. Tell your healthcare provider right away if you have any of these signs and symptoms of liver problems: loss of  appetite, yellowing of your skin and the whites of eyes (jaundice), dark-colored urine, pale colored stools and itchy skin, stomach area (abdominal) pain. ? Resistance to HIV Medicines. If you have untreated HIV infection, PAXLOVID may lead to some HIV medicines not working as well in the future. ? Other possible side effects include: o altered sense of taste o diarrhea o high blood pressure o muscle aches These are not all the possible side effects of PAXLOVID. Not many people have taken PAXLOVID. Serious and unexpected side effects may happen. PAXLOVID is still being studied, so it is possible that all of the risks are not known at this time.  What other treatment choices are there? Veklury (remdesivir) is FDA-approved for the treatment of mild-to-moderate [id number] in certain adults and children. Talk with your doctor to see if Marijean Heath is appropriate for you. Like PAXLOVID, FDA may also allow for the emergency use  of other medicines to treat people with COVID-19. Go to https://price.info/ for information on the emergency use of other medicines that are authorized by FDA to treat people with COVID-19. Your healthcare provider may talk with you about clinical trials for which you may be eligible. It is your choice to be treated or not to be treated with PAXLOVID. Should you decide not to receive it or for your child not to receive it, it will not change your standard medical care.  What if I am pregnant or breastfeeding? There is no experience treating pregnant women or breastfeeding mothers with PAXLOVID. For a mother and unborn baby, the benefit of taking PAXLOVID may be greater than the risk from the treatment. If you are pregnant, discuss your options and specific situation with your healthcare provider. It is recommended that you use effective barrier contraception or do not  have sexual activity while taking PAXLOVID. If you are breastfeeding, discuss your options and specific situation with your healthcare provider. 4 Revised: 25 March 2020   How do I report side effects with PAXLOVID? Contact your healthcare provider if you have any side effects that bother you or do not go away. Report side effects to FDA MedWatch at SmoothHits.hu or call 1-800-FDA1088 or you can report side effects to [employer]. at the contact information provided below. Website Fax number Telephone number www.pfizersafetyreporting.com [phone] How should I store [place]? Store PAXLOVID tablets at room temperature, between 68?F to 77?F (20?C to 25?C). How can I learn more about COVID-19? ? Ask your healthcare provider. ? Visit [email or web address]://jacobson-johnson.com/. ? Contact your local or state public health department. What is an Emergency Use Authorization (EUA)? The [place] FDA has made PAXLOVID available under an emergency access mechanism called an Emergency Use Authorization (EUA). The EUA is supported by a Education officer, museum and Human Service (HHS) declaration that circumstances exist to justify the emergency use of drugs and biological products during the COVID-19 pandemic. PAXLOVID for the treatment of mild-to-moderate COVID-19 in adults and children [1 years of age and older weighing at least 52 pounds (47 kg)] with positive results of direct SARS-CoV-2 viral testing, and who are at high risk for progression to severe COVID-19, including hospitalization or death, has not undergone the same type of review as an FDA-approved product. In issuing an EUA under the UPJSR-15 public health emergency, the FDA has determined, among other things, that based on the total amount of scientific evidence available including data from adequate and well-controlled clinical trials, if available, it is reasonable to believe that the product may be effective for  diagnosing, treating, or preventing COVID-19, or a serious or life-threatening disease or condition caused by COVID-19; that the known and potential benefits of the product, when used to diagnose, treat, or prevent such disease or condition, outweigh the known and potential risks of such product; and that there are no adequate, approved, and available alternatives. All of these criteria must be met to allow for the product to be used in the treatment of patients during the COVID-19 pandemic. The EUA for PAXLOVID is in effect for the duration of the COVID-19 declaration justifying emergency use of this product, unless terminated or revoked (after which the products may no longer be used under the EUA). 5 Revised: 25 March 2020     Additional Information For general questions, visit the website or call the telephone number provided below. Website Telephone number www.COVID19oralRx.com [phone] (1-877-C19-PACK) You can also go to www.pfizermedinfo.com or call [phone] for  more information. [id number] Revised: 25 March 2020

## 2020-10-21 ENCOUNTER — Telehealth: Payer: Medicare PPO | Admitting: Internal Medicine

## 2020-11-18 ENCOUNTER — Ambulatory Visit: Payer: Medicare PPO

## 2020-11-29 ENCOUNTER — Ambulatory Visit: Payer: Medicare PPO

## 2020-12-14 ENCOUNTER — Ambulatory Visit (INDEPENDENT_AMBULATORY_CARE_PROVIDER_SITE_OTHER): Payer: Medicare PPO

## 2020-12-14 ENCOUNTER — Other Ambulatory Visit: Payer: Self-pay

## 2020-12-14 VITALS — BP 130/80 | HR 80 | Temp 98.4°F | Ht 69.0 in | Wt 138.6 lb

## 2020-12-14 DIAGNOSIS — Z0000 Encounter for general adult medical examination without abnormal findings: Secondary | ICD-10-CM

## 2020-12-14 NOTE — Progress Notes (Signed)
Subjective:   Danny Gross is a 86 y.o. male who presents for Medicare Annual/Subsequent preventive examination.  Review of Systems     Cardiac Risk Factors include: advanced age (>30men, >44 women);male gender     Objective:    Today's Vitals   12/14/20 1540  BP: 130/80  Pulse: 80  Temp: 98.4 F (36.9 C)  SpO2: 97%  Weight: 138 lb 9.6 oz (62.9 kg)  Height: 5\' 9"  (1.753 m)  PainSc: 0-No pain   Body mass index is 20.47 kg/m.  Advanced Directives 12/14/2020 09/25/2017 05/10/2015 08/11/2013  Does Patient Have a Medical Advance Directive? Yes Yes Yes Patient has advance directive, copy not in chart  Type of Advance Directive Living will;Healthcare Power of 10/11/2013 Power of [place];Living will - -  Does patient want to make changes to medical advance directive? No - Patient declined - - -  Copy of Healthcare Power of Attorney in Chart? No - copy requested No - copy requested - -    Current Medications (verified) Outpatient Encounter Medications as of 12/14/2020  Medication Sig   brimonidine-timolol (COMBIGAN) 0.2-0.5 % ophthalmic solution Place 1 drop into both eyes every 12 (twelve) hours.   co-enzyme Q-10 30 MG capsule Take 30 mg by mouth daily.   dorzolamide (TRUSOPT) 2 % ophthalmic solution Place 1 drop into both eyes 2 (two) times daily.   guaiFENesin (MUCINEX) 600 MG 12 hr tablet Take 600 mg by mouth 2 (two) times daily as needed.   [DISCONTINUED] benzonatate (TESSALON PERLES) 100 MG capsule Take 1 capsule (100 mg total) by mouth 3 (three) times daily as needed.   No facility-administered encounter medications on file as of 12/14/2020.    Allergies (verified) Patient has no known allergies.   History: Past Medical History:  Diagnosis Date   Cataract    Emphysema of lung [facility])    Past Surgical History:  Procedure Laterality Date   HERNIA REPAIR  2003   TONSILLECTOMY  1939   VIDEO BRONCHOSCOPY Bilateral 08/11/2013   Procedure: VIDEO BRONCHOSCOPY WITH  FLUORO;  Surgeon: 10/11/2013, MD;  Location: [facility];  Service: Cardiopulmonary;  Laterality: Bilateral;   Family History  Problem Relation Age of Onset   Cancer Maternal Aunt        ? type    Social History   Socioeconomic History   Marital status: Married    Spouse name: Not on file   Number of children: 2   Years of education: Not on file   Highest education level: Not on file  Occupational History   Occupation: Retired  Tobacco Use   Smoking status: Never   Smokeless tobacco: Never  Vaping Use   Vaping Use: Never used  Substance and Sexual Activity   Alcohol use: Yes    Alcohol/week: 0.0 standard drinks    Comment: cocktail daily   Drug use: No   Sexual activity: Not Currently  Other Topics Concern   Not on file  Social History Narrative   Not on file   Social Determinants of Health   Financial Resource Strain: Low Risk    Difficulty of Paying Living Expenses: Not hard at all  Food Insecurity: No Food Insecurity   Worried About [place] in the Last Year: Never true   Ran Out of Food in the Last Year: Never true  Transportation Needs: No Transportation Needs   Lack of Transportation (Medical): No   Lack of Transportation (Non-Medical): No  Physical Activity: Sufficiently  Active   Days of Exercise per Week: 6 days   Minutes of Exercise per Session: 60 min  Stress: No Stress Concern Present   Feeling of Stress : Not at all  Social Connections: Socially Integrated   Frequency of Communication with Friends and Family: More than three times a week   Frequency of Social Gatherings with Friends and Family: More than three times a week   Attends Religious Services: 1 to 4 times per year   Active Member of [employer] or Organizations: Yes   Attends Banker Meetings: 1 to 4 times per year   Marital Status: Married    Tobacco Counseling Counseling given: Not Answered   Clinical Intake:  Pre-visit preparation completed: No  Pain :  No/denies pain Pain Score: 0-No pain     BMI - recorded: 20.47 Nutritional Status: BMI of 19-24  Normal Nutritional Risks: None Diabetes: No  How often do you need to have someone help you when you read instructions, pamphlets, or other written materials from your doctor or pharmacy?: 1 - Never What is the last grade level you completed in school?: Doctor of Philosophy  Diabetic? no  Interpreter Needed?: No  Information entered by :: Susie Cassette, LPN   Activities of Daily Living In your present state of health, do you have any difficulty performing the following activities: 12/14/2020 07/19/2020  Hearing? N N  Vision? N N  Difficulty concentrating or making decisions? N N  Walking or climbing stairs? N N  Dressing or bathing? N N  Doing errands, shopping? N N  Preparing Food and eating ? N -  Using the Toilet? N -  In the past six months, have you accidently leaked urine? N -  Do you have problems with loss of bowel control? N -  Managing your Medications? N -  Managing your Finances? N -  Housekeeping or managing your Housekeeping? N -  Some recent data might be hidden    Patient Care Team: Myrlene Broker, MD as PCP - General (Internal Medicine) Sallye Lat, MD as Consulting Physician (Ophthalmology)  Indicate any recent Medical Services you may have received from other than [facility] providers in the past year (date may be approximate).     Assessment:   This is a routine wellness examination for Danny Gross.  Hearing/Vision screen Hearing Screening - Comments:: Patient denied any hearing difficulty.   No hearing aids.  Vision Screening - Comments:: Patient wears corrective glasses/contacts.  Eye exam done three times a year by: Sallye Lat, MD.  Dietary issues and exercise activities discussed: Current Exercise Habits: Home exercise routine, Type of exercise: walking;Other - see comments (running and dancing), Time (Minutes): 60, Frequency  (Times/Week): 6, Weekly Exercise (Minutes/Week): 360, Intensity: Moderate, Exercise limited by: None identified   Goals Addressed               This Visit's Progress     Patient Stated (pt-stated)        To maintain my current health status by continuing to eat healthy, stay physically active and socially active.      Depression Screen PHQ 2/9 Scores 12/14/2020 04/12/2020 10/12/2019 03/25/2019 04/22/2018 11/18/2017 09/25/2017  PHQ - 2 Score 0 0 0 0 0 0 0    Fall Risk Fall Risk  12/14/2020 10/20/2020 04/12/2020 10/12/2019 03/25/2019  Falls in the past year? 0 0 0 0 0  Number falls in past yr: 0 0 0 0 -  Injury with Fall? 0 0 0 0 -  Risk for fall due to : No Fall Risks - - - Impaired balance/gait  Follow up Falls prevention discussed - - - Falls evaluation completed    FALL RISK PREVENTION PERTAINING TO THE HOME:  Any stairs in or around the home? No  If so, are there any without handrails? No  Home free of loose throw rugs in walkways, pet beds, electrical cords, etc? Yes  Adequate lighting in your home to reduce risk of falls? Yes   ASSISTIVE DEVICES UTILIZED TO PREVENT FALLS:  Life alert? No  Use of a cane, walker or w/c? No  Grab bars in the bathroom? Yes  Shower chair or bench in shower? No  Elevated toilet seat or a handicapped toilet? No   TIMED UP AND GO:  Was the test performed? Yes .  Length of time to ambulate 10 feet: 6 sec.   Gait steady and fast without use of assistive device  Cognitive Function: Normal cognitive status assessed by direct observation by this Nurse Health Advisor. No abnormalities found.   MMSE - Mini Mental State Exam 09/25/2017  Orientation to time 5  Orientation to Place 5  Registration 3  Attention/ Calculation 5  Recall 2  Language- name 2 objects 2  Language- repeat 1  Language- follow 3 step command 3  Language- read & follow direction 1  Write a sentence 1  Copy design 1  Total score 29        Immunizations Immunization  History  Administered Date(s) Administered   Fluad Quad(high Dose 65+) 10/23/2018, 11/22/2020   Influenza Split 10/08/2012   Influenza, High Dose Seasonal PF 10/04/2014   Influenza,inj,Quad PF,6+ Mos 09/18/2013   Influenza-Unspecified 10/22/2015, 10/12/2016, 10/15/2017   PFIZER Comirnaty(Gray Top)Covid-19 Tri-Sucrose Vaccine 03/01/2020, 07/28/2020   PFIZER(Purple Top)SARS-COV-2 Vaccination 02/02/2019, 02/20/2019   Pfizer Covid-19 Vaccine Bivalent Booster 20yrs & up 12/06/2020   Pneumococcal Conjugate-13 07/27/2013   Pneumococcal Polysaccharide-23 10/04/2014   Tdap 01/08/2010   Zoster Recombinat (Shingrix) 06/19/2016, 11/02/2016   Zoster, Live 01/08/2006    TDAP status: Due, Education has been provided regarding the importance of this vaccine. Advised may receive this vaccine at local pharmacy or Health Dept. Aware to provide a copy of the vaccination record if obtained from local pharmacy or Health Dept. Verbalized acceptance and understanding.  Flu Vaccine status: Up to date  Pneumococcal vaccine status: Up to date  Covid-19 vaccine status: Completed vaccines  Qualifies for Shingles Vaccine? Yes   Zostavax completed Yes   Shingrix Completed?: Yes  Screening Tests Health Maintenance  Topic Date Due   TETANUS/TDAP  01/09/2020   Pneumonia Vaccine 81+ Years old  Completed   INFLUENZA VACCINE  Completed   COVID-19 Vaccine  Completed   Zoster Vaccines- Shingrix  Completed   HPV VACCINES  Aged Out    Health Maintenance  Health Maintenance Due  Topic Date Due   TETANUS/TDAP  01/09/2020    Colorectal cancer screening: No longer required.   Lung Cancer Screening: (Low Dose CT Chest recommended if Age 26-80 years, 30 pack-year currently smoking OR have quit w/in 15years.) does not qualify.   Lung Cancer Screening Referral: no  Additional Screening:  Hepatitis C Screening: does not qualify; Completed no  Vision Screening: Recommended annual ophthalmology exams for early  detection of glaucoma and other disorders of the eye. Is the patient up to date with their annual eye exam?  Yes  Who is the provider or what is the name of the office in which the patient attends  annual eye exams? Sallye Lat, MD. If pt is not established with a provider, would they like to be referred to a provider to establish care? No .   Dental Screening: Recommended annual dental exams for proper oral hygiene  Community Resource Referral / Chronic Care Management: CRR required this visit?  No   CCM required this visit?  No      Plan:     I have personally reviewed and noted the following in the patient's chart:   Medical and social history Use of alcohol, tobacco or illicit drugs  Current medications and supplements including opioid prescriptions. Patient is not currently taking opioid prescriptions. Functional ability and status Nutritional status Physical activity Advanced directives List of other physicians Hospitalizations, surgeries, and ER visits in previous 12 months Vitals Screenings to include cognitive, depression, and falls Referrals and appointments  In addition, I have reviewed and discussed with patient certain preventive protocols, quality metrics, and best practice recommendations. A written personalized care plan for preventive services as well as general preventive health recommendations were provided to patient.     Mickeal Needy, LPN   16/01/958   Nurse Notes:  Hearing Screening - Comments:: Patient denied any hearing difficulty.   No hearing aids.  Vision Screening - Comments:: Patient wears corrective glasses/contacts.  Eye exam done three times a year by: Sallye Lat, MD.

## 2020-12-14 NOTE — Patient Instructions (Signed)
[name] , Thank you for taking time to come for your Medicare Wellness Visit. I appreciate your ongoing commitment to your health goals. Please review the following plan we discussed and let me know if I can assist you in the future.   Screening recommendations/referrals: Colonoscopy: Not a candidate for screening due to age Recommended yearly ophthalmology/optometry visit for glaucoma screening and checkup Recommended yearly dental visit for hygiene and checkup  Vaccinations: Influenza vaccine: 11/2020 from [facility] Pneumococcal vaccine: 07/27/2013, 10/04/2014 Tdap vaccine: 12/08/2010; due every 10 years; can check with provider office or local pharmacy after 01/08/2021 Shingles vaccine: 06/19/2016, 11/02/2016   Covid-19: 02/02/2019, 02/20/2019, 03/01/2020, 07/28/2020, 12/06/2020  Advanced directives: Please bring a copy of your health care power of attorney and living will to the office at your convenience.  Conditions/risks identified: Please schedule your next Medicare Wellness Visit with your Nurse Health Advisor in 1 year by calling [phone].  Next appointment: Please schedule your next Medicare Wellness Visit with your Nurse Health Advisor in 1 year by calling [phone].  Preventive Care 84 Years and Older, Male Preventive care refers to lifestyle choices and visits with your health care provider that can promote health and wellness. What does preventive care include? A yearly physical exam. This is also called an annual well check. Dental exams once or twice a year. Routine eye exams. Ask your health care provider how often you should have your eyes checked. Personal lifestyle choices, including: Daily care of your teeth and gums. Regular physical activity. Eating a healthy diet. Avoiding tobacco and drug use. Limiting alcohol use. Practicing safe sex. Taking low doses of aspirin every day. Taking vitamin and mineral supplements as recommended by your health care  provider. What happens during an annual well check? The services and screenings done by your health care provider during your annual well check will depend on your age, overall health, lifestyle risk factors, and family history of disease. Counseling  Your health care provider may ask you questions about your: Alcohol use. Tobacco use. Drug use. Emotional well-being. Home and relationship well-being. Sexual activity. Eating habits. History of falls. Memory and ability to understand (cognition). Work and work Astronomer. Screening  You may have the following tests or measurements: Height, weight, and BMI. Blood pressure. Lipid and cholesterol levels. These may be checked every 5 years, or more frequently if you are over 14 years old. Skin check. Lung cancer screening. You may have this screening every year starting at age 84 if you have a 30-pack-year history of smoking and currently smoke or have quit within the past 15 years. Fecal occult blood test (FOBT) of the stool. You may have this test every year starting at age 84. Flexible sigmoidoscopy or colonoscopy. You may have a sigmoidoscopy every 5 years or a colonoscopy every 10 years starting at age 84. Prostate cancer screening. Recommendations will vary depending on your family history and other risks. Hepatitis C blood test. Hepatitis B blood test. Sexually transmitted disease (STD) testing. Diabetes screening. This is done by checking your blood sugar (glucose) after you have not eaten for a while (fasting). You may have this done every 1-3 years. Abdominal aortic aneurysm (AAA) screening. You may need this if you are a current or former smoker. Osteoporosis. You may be screened starting at age 84 if you are at high risk. Talk with your health care provider about your test results, treatment options, and if necessary, the need for more tests. Vaccines  Your health care provider may recommend  certain vaccines, such  as: Influenza vaccine. This is recommended every year. Tetanus, diphtheria, and acellular pertussis (Tdap, Td) vaccine. You may need a Td booster every 10 years. Zoster vaccine. You may need this after age 84. Pneumococcal 13-valent conjugate (PCV13) vaccine. One dose is recommended after age 84. Pneumococcal polysaccharide (PPSV23) vaccine. One dose is recommended after age 84. Talk to your health care provider about which screenings and vaccines you need and how often you need them. This information is not intended to replace advice given to you by your health care provider. Make sure you discuss any questions you have with your health care provider. Document Released: 01/21/2015 Document Revised: 09/14/2015 Document Reviewed: 10/26/2014 Elsevier Interactive Patient Education  2017 [place] Prevention in the Home Falls can cause injuries. They can happen to people of all ages. There are many things you can do to make your home safe and to help prevent falls. What can I do on the outside of my home? Regularly fix the edges of walkways and driveways and fix any cracks. Remove anything that might make you trip as you walk through a door, such as a raised step or threshold. Trim any bushes or trees on the path to your home. Use bright outdoor lighting. Clear any walking paths of anything that might make someone trip, such as rocks or tools. Regularly check to see if handrails are loose or broken. Make sure that both sides of any steps have handrails. Any raised decks and porches should have guardrails on the edges. Have any leaves, snow, or ice cleared regularly. Use sand or salt on walking paths during winter. Clean up any spills in your garage right away. This includes oil or grease spills. What can I do in the bathroom? Use night lights. Install grab bars by the toilet and in the tub and shower. Do not use towel bars as grab bars. Use non-skid mats or decals in the tub or  shower. If you need to sit down in the shower, use a plastic, non-slip stool. Keep the floor dry. Clean up any water that spills on the floor as soon as it happens. Remove soap buildup in the tub or shower regularly. Attach bath mats securely with double-sided non-slip rug tape. Do not have throw rugs and other things on the floor that can make you trip. What can I do in the bedroom? Use night lights. Make sure that you have a light by your bed that is easy to reach. Do not use any sheets or blankets that are too big for your bed. They should not hang down onto the floor. Have a firm chair that has side arms. You can use this for support while you get dressed. Do not have throw rugs and other things on the floor that can make you trip. What can I do in the kitchen? Clean up any spills right away. Avoid walking on wet floors. Keep items that you use a lot in easy-to-reach places. If you need to reach something above you, use a strong step stool that has a grab bar. Keep electrical cords out of the way. Do not use floor polish or wax that makes floors slippery. If you must use wax, use non-skid floor wax. Do not have throw rugs and other things on the floor that can make you trip. What can I do with my stairs? Do not leave any items on the stairs. Make sure that there are handrails on both sides of the  stairs and use them. Fix handrails that are broken or loose. Make sure that handrails are as [place] as the stairways. Check any carpeting to make sure that it is firmly attached to the stairs. Fix any carpet that is loose or worn. Avoid having throw rugs at the top or bottom of the stairs. If you do have throw rugs, attach them to the floor with carpet tape. Make sure that you have a light switch at the top of the stairs and the bottom of the stairs. If you do not have them, ask someone to add them for you. What else can I do to help prevent falls? Wear shoes that: Do not have high heels. Have  rubber bottoms. Are comfortable and fit you well. Are closed at the toe. Do not wear sandals. If you use a stepladder: Make sure that it is fully opened. Do not climb a closed stepladder. Make sure that both sides of the stepladder are locked into place. Ask someone to hold it for you, if possible. Clearly mark and make sure that you can see: Any grab bars or handrails. First and last steps. Where the edge of each step is. Use tools that help you move around (mobility aids) if they are needed. These include: Canes. Walkers. Scooters. Crutches. Turn on the lights when you go into a dark area. Replace any light bulbs as soon as they burn out. Set up your furniture so you have a clear path. Avoid moving your furniture around. If any of your floors are uneven, fix them. If there are any pets around you, be aware of where they are. Review your medicines with your doctor. Some medicines can make you feel dizzy. This can increase your chance of falling. Ask your doctor what other things that you can do to help prevent falls. This information is not intended to replace advice given to you by your health care provider. Make sure you discuss any questions you have with your health care provider. Document Released: 10/21/2008 Document Revised: 06/02/2015 Document Reviewed: 01/29/2014 Elsevier Interactive Patient Education  2017 [employer].

## 2020-12-21 ENCOUNTER — Emergency Department (HOSPITAL_COMMUNITY): Payer: Medicare PPO

## 2020-12-21 ENCOUNTER — Other Ambulatory Visit: Payer: Self-pay

## 2020-12-21 ENCOUNTER — Telehealth: Payer: Self-pay

## 2020-12-21 ENCOUNTER — Emergency Department (HOSPITAL_COMMUNITY)
Admission: EM | Admit: 2020-12-21 | Discharge: 2020-12-21 | Disposition: A | Discharge status: Home / Self Care | Payer: Medicare PPO | Attending: Emergency Medicine | Admitting: Emergency Medicine

## 2020-12-21 DIAGNOSIS — J101 Influenza due to other identified influenza virus with other respiratory manifestations: Secondary | ICD-10-CM

## 2020-12-21 DIAGNOSIS — R509 Fever, unspecified: Secondary | ICD-10-CM | POA: Diagnosis not present

## 2020-12-21 DIAGNOSIS — Z20822 Contact with and (suspected) exposure to covid-19: Secondary | ICD-10-CM | POA: Diagnosis not present

## 2020-12-21 DIAGNOSIS — R0902 Hypoxemia: Secondary | ICD-10-CM | POA: Diagnosis not present

## 2020-12-21 DIAGNOSIS — R531 Weakness: Secondary | ICD-10-CM | POA: Diagnosis not present

## 2020-12-21 DIAGNOSIS — R0602 Shortness of breath: Secondary | ICD-10-CM | POA: Diagnosis not present

## 2020-12-21 DIAGNOSIS — R059 Cough, unspecified: Secondary | ICD-10-CM | POA: Diagnosis not present

## 2020-12-21 DIAGNOSIS — I10 Essential (primary) hypertension: Secondary | ICD-10-CM | POA: Diagnosis not present

## 2020-12-21 LAB — CBC WITH DIFFERENTIAL/PLATELET
Abs Immature Granulocytes: 0.02 10*3/uL (ref 0.00–0.07)
Basophils Absolute: 0 10*3/uL (ref 0.0–0.1)
Basophils Relative: 1 %
Eosinophils Absolute: 0.1 10*3/uL (ref 0.0–0.5)
Eosinophils Relative: 2 %
HCT: 39.5 % (ref 39.0–52.0)
Hemoglobin: 12.9 g/dL — ABNORMAL LOW (ref 13.0–17.0)
Immature Granulocytes: 0 %
Lymphocytes Relative: 9 %
Lymphs Abs: 0.4 10*3/uL — ABNORMAL LOW (ref 0.7–4.0)
MCH: 29.4 pg (ref 26.0–34.0)
MCHC: 32.7 g/dL (ref 30.0–36.0)
MCV: 90 fL (ref 80.0–100.0)
Monocytes Absolute: 0.7 10*3/uL (ref 0.1–1.0)
Monocytes Relative: 14 %
Neutro Abs: 3.4 10*3/uL (ref 1.7–7.7)
Neutrophils Relative %: 74 %
Platelets: 93 10*3/uL — ABNORMAL LOW (ref 150–400)
RBC: 4.39 MIL/uL (ref 4.22–5.81)
RDW: 14 % (ref 11.5–15.5)
WBC: 4.6 10*3/uL (ref 4.0–10.5)
nRBC: 0 % (ref 0.0–0.2)

## 2020-12-21 LAB — COMPREHENSIVE METABOLIC PANEL
ALT: 15 U/L (ref 0–44)
AST: 22 U/L (ref 15–41)
Albumin: 3.6 g/dL (ref 3.5–5.0)
Alkaline Phosphatase: 89 U/L (ref 38–126)
Anion gap: 6 (ref 5–15)
BUN: 25 mg/dL — ABNORMAL HIGH (ref 8–23)
CO2: 25 mmol/L (ref 22–32)
Calcium: 8.4 mg/dL — ABNORMAL LOW (ref 8.9–10.3)
Chloride: 104 mmol/L (ref 98–111)
Creatinine, Ser: 1.02 mg/dL (ref 0.61–1.24)
GFR, Estimated: >60 mL/min (ref >60)
Glucose, Bld: 114 mg/dL — ABNORMAL HIGH (ref 70–99)
Potassium: 3.8 mmol/L (ref 3.5–5.1)
Sodium: 135 mmol/L (ref 135–145)
Total Bilirubin: 0.9 mg/dL (ref 0.3–1.2)
Total Protein: 6.5 g/dL (ref 6.5–8.1)

## 2020-12-21 LAB — RESP PANEL BY RT-PCR (FLU A&B, COVID) ARPGX2
Influenza A by PCR: POSITIVE — AB
Influenza B by PCR: NEGATIVE
SARS Coronavirus 2 by RT PCR: NEGATIVE

## 2020-12-21 MED ORDER — METHYLPREDNISOLONE SODIUM SUCC 125 MG IJ SOLR
125.0000 mg | Freq: Once | INTRAMUSCULAR | Status: AC
  Administered 2020-12-21: 14:00:00 125 mg via INTRAVENOUS
  Filled 2020-12-21: qty 2

## 2020-12-21 MED ORDER — OSELTAMIVIR PHOSPHATE 75 MG PO CAPS: 75.0000 mg | ORAL_CAPSULE | Freq: Two times a day (BID) | ORAL | 0 refills | Status: DC

## 2020-12-21 MED ORDER — OSELTAMIVIR PHOSPHATE 75 MG PO CAPS
75.0000 mg | ORAL_CAPSULE | Freq: Once | ORAL | Status: AC
  Administered 2020-12-21: 16:00:00 75 mg via ORAL
  Filled 2020-12-21: qty 1

## 2020-12-21 MED ORDER — ALBUTEROL SULFATE (2.5 MG/3ML) 0.083% IN NEBU
2.5000 mg | INHALATION_SOLUTION | Freq: Once | RESPIRATORY_TRACT | Status: AC
  Administered 2020-12-21: 14:00:00 2.5 mg via RESPIRATORY_TRACT
  Filled 2020-12-21: qty 3

## 2020-12-21 MED ORDER — IPRATROPIUM-ALBUTEROL 0.5-2.5 (3) MG/3ML IN SOLN
3.0000 mL | Freq: Once | RESPIRATORY_TRACT | Status: AC
  Administered 2020-12-21: 14:00:00 3 mL via RESPIRATORY_TRACT
  Filled 2020-12-21: qty 3

## 2020-12-21 MED ORDER — ALBUTEROL SULFATE HFA 108 (90 BASE) MCG/ACT IN AERS
2.0000 | INHALATION_SPRAY | Freq: Once | RESPIRATORY_TRACT | Status: AC
  Administered 2020-12-21: 16:00:00 2 via RESPIRATORY_TRACT
  Filled 2020-12-21: qty 6.7

## 2020-12-21 NOTE — ED Provider Notes (Signed)
[facility]-EMERGENCY DEPT Provider Note   CSN: [id number] Arrival date & time: 12/21/20  1255     History Chief Complaint  Patient presents with   Weakness   Cough    Danny Gross is a 86 y.o. male.  Patient complains of shortness of breath.  Patient states he was out dancing with 200 people recently.  He started with a cough 2 days ago.  The history is provided by the patient and medical records. No language interpreter was used.  Weakness Severity:  Moderate Onset quality:  Sudden Timing:  Constant Progression:  Waxing and waning Chronicity:  New Context: not alcohol use   Relieved by:  Nothing Worsened by:  Nothing Associated symptoms: cough   Associated symptoms: no abdominal pain, no chest pain, no diarrhea, no frequency, no headaches and no seizures   Cough Associated symptoms: no chest pain, no eye discharge, no headaches and no rash       Past Medical History:  Diagnosis Date   Cataract    Emphysema of lung (HCC)     Patient Active Problem List   Diagnosis Date Noted   Swelling abdomen 07/22/2020   Aortic atherosclerosis (HCC) 07/19/2020   Bronchiectasis without complication (HCC) 04/22/2018   Mycobacterium abscessus infection 04/22/2018   Impaired fasting glucose 10/04/2014   Routine general medical examination at a health care facility 12/21/2013   Pulmonary infiltrates 07/13/2013    Past Surgical History:  Procedure Laterality Date   HERNIA REPAIR  2003   TONSILLECTOMY  1939   VIDEO BRONCHOSCOPY Bilateral 08/11/2013   Procedure: VIDEO BRONCHOSCOPY WITH FLUORO;  Surgeon: Nyoka Cowden, MD;  Location: [facility];  Service: Cardiopulmonary;  Laterality: Bilateral;       Family History  Problem Relation Age of Onset   Cancer Maternal Aunt        ? type     Social History   Tobacco Use   Smoking status: Never   Smokeless tobacco: Never  Vaping Use   Vaping Use: Never used  Substance Use Topics   Alcohol use:  Yes    Alcohol/week: 0.0 standard drinks    Comment: cocktail daily   Drug use: No    Home Medications Prior to Admission medications   Medication Sig Start Date End Date Taking? Authorizing Provider  acetaminophen (TYLENOL) 500 MG tablet Take 500 mg by mouth every 6 (six) hours as needed for mild pain.   Yes [provider]  brimonidine-timolol (COMBIGAN) 0.2-0.5 % ophthalmic solution Place 1 drop into both eyes every 12 (twelve) hours.   Yes [provider]  co-enzyme Q-10 30 MG capsule Take 30 mg by mouth daily.   Yes [provider]  dorzolamide (TRUSOPT) 2 % ophthalmic solution Place 1 drop into both eyes 2 (two) times daily.   Yes [provider]  guaiFENesin (MUCINEX) 600 MG 12 hr tablet Take 600 mg by mouth 2 (two) times daily as needed.   Yes [provider]  ROCKLATAN 0.02-0.005 % SOLN Apply 1 drop to eye at bedtime. 12/05/20  Yes [provider]    Allergies    Patient has no known allergies.  Review of Systems   Review of Systems  Constitutional:  Negative for appetite change and fatigue.  HENT:  Negative for congestion, ear discharge and sinus pressure.   Eyes:  Negative for discharge.  Respiratory:  Positive for cough.   Cardiovascular:  Negative for chest pain.  Gastrointestinal:  Negative for abdominal pain  and diarrhea.  Genitourinary:  Negative for frequency and hematuria.  Musculoskeletal:  Negative for back pain.  Skin:  Negative for rash.  Neurological:  Positive for weakness. Negative for seizures and headaches.  Psychiatric/Behavioral:  Negative for hallucinations.    Physical Exam Updated Vital Signs BP 140/65    Pulse 70    Resp 20    SpO2 92%   Physical Exam Vitals and nursing note reviewed.  Constitutional:      Appearance: He is well-developed.  HENT:     Head: Normocephalic.     Nose: Nose normal.  Eyes:     General: No scleral icterus.    Conjunctiva/sclera: Conjunctivae normal.  Neck:      Thyroid: No thyromegaly.  Cardiovascular:     Rate and Rhythm: Normal rate and regular rhythm.     Heart sounds: No murmur heard.   No friction rub. No gallop.  Pulmonary:     Breath sounds: No stridor. Wheezing present. No rales.     Comments: Minimal wheezing Chest:     Chest wall: No tenderness.  Abdominal:     General: There is no distension.     Tenderness: There is no abdominal tenderness. There is no rebound.  Musculoskeletal:        General: Normal range of motion.     Cervical back: Neck supple.  Lymphadenopathy:     Cervical: No cervical adenopathy.  Skin:    Findings: No erythema or rash.  Neurological:     Mental Status: He is alert and oriented to person, place, and time.     Motor: No abnormal muscle tone.     Coordination: Coordination normal.  Psychiatric:        Behavior: Behavior normal.    ED Results / Procedures / Treatments   Labs (all labs ordered are listed, but only abnormal results are displayed) Labs Reviewed  RESP PANEL BY RT-PCR (FLU A&B, COVID) ARPGX2 - Abnormal; Notable for the following components:      Result Value   Influenza A by PCR POSITIVE (*)    All other components within normal limits  CBC WITH DIFFERENTIAL/PLATELET - Abnormal; Notable for the following components:   Hemoglobin 12.9 (*)    All other components within normal limits  COMPREHENSIVE METABOLIC PANEL - Abnormal; Notable for the following components:   Glucose, Bld 114 (*)    BUN 25 (*)    Calcium 8.4 (*)    All other components within normal limits    EKG None  Radiology DG Chest Port 1 View  Result Date: 12/21/2020 CLINICAL DATA:  Fever, cough, and shortness of breath for the past 2 days. EXAM: PORTABLE CHEST 1 VIEW COMPARISON:  CTA chest dated November 30, 2019. Chest x-ray dated November 18, 2017. FINDINGS: The heart size and mediastinal contours are within normal limits. Normal pulmonary vascularity. No focal consolidation, pleural effusion, or  pneumothorax. No acute osseous abnormality. IMPRESSION: No active disease. Electronically Signed   By: Titus Dubin M.D.   On: 12/21/2020 14:35    Procedures Procedures   Medications Ordered in ED Medications  oseltamivir (TAMIFLU) capsule 75 mg (has no administration in time range)  albuterol (VENTOLIN HFA) 108 (90 Base) MCG/ACT inhaler 2 puff (has no administration in time range)  methylPREDNISolone sodium succinate (SOLU-MEDROL) 125 mg/2 mL injection 125 mg (125 mg Intravenous Given 12/21/20 1347)  ipratropium-albuterol (DUONEB) 0.5-2.5 (3) MG/3ML nebulizer solution 3 mL (3 mLs Nebulization Given 12/21/20 1346)  albuterol (PROVENTIL) (2.5 MG/3ML) 0.083%  nebulizer solution 2.5 mg (2.5 mg Nebulization Given 12/21/20 1346)    ED Course  I have reviewed the triage vital signs and the nursing notes.  Pertinent labs & imaging results that were available during my care of the patient were reviewed by me and considered in my medical decision making (see chart for details). Patient with influenza.  Patient's room air sats are 94%    MDM Rules/Calculators/A&P                           Patient with COPD and influenza.  He was discharged home on Tamiflu and an albuterol inhaler and follow-up with his PCP.  He is instructed to return if he has any problems Final Clinical Impression(s) / ED Diagnoses Final diagnoses:  Influenza A    Rx / DC Orders ED Discharge Orders     None        Milton Ferguson, MD 12/21/20 1559

## 2020-12-21 NOTE — Telephone Encounter (Signed)
Patient's wife called stating patient has had a fever of 102.1 x 2 days . Patient started having congestion, cough and fatigue 2 days ago. Patient wife states she did a home Covid test on the patient that was negative. Patient has been taking OTC tylenol and cough medication. Patient's wife advised to take the patient to the ED. Patient wife stated she would have to call EMS non emergency transportation for the patient. Patient is not mobile and she can not move him. Akhil Piscopo T Pricilla Loveless

## 2020-12-21 NOTE — ED Triage Notes (Signed)
Patient BIBA. Called for weakness, cough, and fever 2X days. Coughing green sputum. Fever of 102F treated with Tylenol at home. Put on 5L [place] by EMS.  AOX4

## 2020-12-21 NOTE — Discharge Instructions (Signed)
Follow-up with your doctor Friday or next week.  Return sooner if any problems

## 2020-12-26 ENCOUNTER — Telehealth: Payer: Self-pay

## 2020-12-26 ENCOUNTER — Encounter: Payer: Self-pay | Admitting: Internal Medicine

## 2020-12-26 MED ORDER — BENZONATATE 200 MG PO CAPS: 200.0000 mg | ORAL_CAPSULE | Freq: Two times a day (BID) | ORAL | 0 refills | Status: DC | PRN

## 2020-12-26 MED ORDER — PROMETHAZINE-DM 6.25-15 MG/5ML PO SYRP: 5.0000 mL | ORAL_SOLUTION | Freq: Every evening | ORAL | 0 refills | Status: DC | PRN

## 2020-12-26 NOTE — Telephone Encounter (Signed)
Sent in tessalon perles and promethazine/dm cough syrup.

## 2020-12-26 NOTE — Telephone Encounter (Signed)
Patient contacted our office for cough medication. Patient recently diagnosed with influenza and requesting medication for cough. Advised patient to contact PCP for cough medication. Patient denies any shortness of breath  [employer]

## 2020-12-26 NOTE — Addendum Note (Signed)
Addended by: Hillard Danker A on: 12/26/2020 01:41 PM   Modules accepted: Orders

## 2021-01-24 DIAGNOSIS — H409 Unspecified glaucoma: Secondary | ICD-10-CM | POA: Diagnosis not present

## 2021-01-24 DIAGNOSIS — G8929 Other chronic pain: Secondary | ICD-10-CM | POA: Diagnosis not present

## 2021-01-24 DIAGNOSIS — Z809 Family history of malignant neoplasm, unspecified: Secondary | ICD-10-CM | POA: Diagnosis not present

## 2021-01-24 DIAGNOSIS — R030 Elevated blood-pressure reading, without diagnosis of hypertension: Secondary | ICD-10-CM | POA: Diagnosis not present

## 2021-01-31 DIAGNOSIS — Z961 Presence of intraocular lens: Secondary | ICD-10-CM | POA: Diagnosis not present

## 2021-01-31 DIAGNOSIS — H43812 Vitreous degeneration, left eye: Secondary | ICD-10-CM | POA: Diagnosis not present

## 2021-01-31 DIAGNOSIS — H401233 Low-tension glaucoma, bilateral, severe stage: Secondary | ICD-10-CM | POA: Diagnosis not present

## 2021-02-09 ENCOUNTER — Encounter: Payer: Self-pay | Admitting: Internal Medicine

## 2021-04-06 DIAGNOSIS — D1801 Hemangioma of skin and subcutaneous tissue: Secondary | ICD-10-CM | POA: Diagnosis not present

## 2021-04-06 DIAGNOSIS — Z85828 Personal history of other malignant neoplasm of skin: Secondary | ICD-10-CM | POA: Diagnosis not present

## 2021-04-06 DIAGNOSIS — L570 Actinic keratosis: Secondary | ICD-10-CM | POA: Diagnosis not present

## 2021-04-06 DIAGNOSIS — L821 Other seborrheic keratosis: Secondary | ICD-10-CM | POA: Diagnosis not present

## 2021-04-13 ENCOUNTER — Other Ambulatory Visit (HOSPITAL_BASED_OUTPATIENT_CLINIC_OR_DEPARTMENT_OTHER): Payer: Self-pay

## 2021-04-13 ENCOUNTER — Emergency Department (HOSPITAL_BASED_OUTPATIENT_CLINIC_OR_DEPARTMENT_OTHER)
Admission: EM | Admit: 2021-04-13 | Discharge: 2021-04-13 | Disposition: A | Discharge status: Home / Self Care | Payer: Medicare PPO | Attending: Emergency Medicine | Admitting: Emergency Medicine

## 2021-04-13 ENCOUNTER — Encounter (HOSPITAL_BASED_OUTPATIENT_CLINIC_OR_DEPARTMENT_OTHER): Payer: Self-pay | Admitting: Emergency Medicine

## 2021-04-13 ENCOUNTER — Other Ambulatory Visit: Payer: Self-pay

## 2021-04-13 DIAGNOSIS — W5501XA Bitten by cat, initial encounter: Secondary | ICD-10-CM | POA: Insufficient documentation

## 2021-04-13 DIAGNOSIS — S81851A Open bite, right lower leg, initial encounter: Secondary | ICD-10-CM | POA: Diagnosis not present

## 2021-04-13 DIAGNOSIS — Z23 Encounter for immunization: Secondary | ICD-10-CM | POA: Diagnosis not present

## 2021-04-13 DIAGNOSIS — S8991XA Unspecified injury of right lower leg, initial encounter: Secondary | ICD-10-CM | POA: Diagnosis present

## 2021-04-13 LAB — COMPREHENSIVE METABOLIC PANEL
ALT: 11 U/L (ref 0–44)
AST: 18 U/L (ref 15–41)
Albumin: 4.4 g/dL (ref 3.5–5.0)
Alkaline Phosphatase: 92 U/L (ref 38–126)
Anion gap: 8 (ref 5–15)
BUN: 26 mg/dL — ABNORMAL HIGH (ref 8–23)
CO2: 27 mmol/L (ref 22–32)
Calcium: 9.9 mg/dL (ref 8.9–10.3)
Chloride: 101 mmol/L (ref 98–111)
Creatinine, Ser: 1.07 mg/dL (ref 0.61–1.24)
GFR, Estimated: >60 mL/min (ref >60)
Glucose, Bld: 111 mg/dL — ABNORMAL HIGH (ref 70–99)
Potassium: 4.3 mmol/L (ref 3.5–5.1)
Sodium: 136 mmol/L (ref 135–145)
Total Bilirubin: 2 mg/dL — ABNORMAL HIGH (ref 0.3–1.2)
Total Protein: 7.7 g/dL (ref 6.5–8.1)

## 2021-04-13 LAB — CBC WITH DIFFERENTIAL/PLATELET
Abs Immature Granulocytes: 0.04 10*3/uL (ref 0.00–0.07)
Basophils Absolute: 0 10*3/uL (ref 0.0–0.1)
Basophils Relative: 0 %
Eosinophils Absolute: 0.1 10*3/uL (ref 0.0–0.5)
Eosinophils Relative: 1 %
HCT: 44 % (ref 39.0–52.0)
Hemoglobin: 14 g/dL (ref 13.0–17.0)
Immature Granulocytes: 0 %
Lymphocytes Relative: 10 %
Lymphs Abs: 1 10*3/uL (ref 0.7–4.0)
MCH: 28.9 pg (ref 26.0–34.0)
MCHC: 31.8 g/dL (ref 30.0–36.0)
MCV: 90.7 fL (ref 80.0–100.0)
Monocytes Absolute: 0.9 10*3/uL (ref 0.1–1.0)
Monocytes Relative: 9 %
Neutro Abs: 8 10*3/uL — ABNORMAL HIGH (ref 1.7–7.7)
Neutrophils Relative %: 80 %
Platelets: 108 10*3/uL — ABNORMAL LOW (ref 150–400)
RBC: 4.85 MIL/uL (ref 4.22–5.81)
RDW: 13.7 % (ref 11.5–15.5)
WBC: 10.1 10*3/uL (ref 4.0–10.5)
nRBC: 0 % (ref 0.0–0.2)

## 2021-04-13 LAB — URINALYSIS, ROUTINE W REFLEX MICROSCOPIC
Bilirubin Urine: NEGATIVE
Glucose, UA: NEGATIVE mg/dL
Hgb urine dipstick: NEGATIVE
Ketones, ur: NEGATIVE mg/dL
Leukocytes,Ua: NEGATIVE
Nitrite: NEGATIVE
Specific Gravity, Urine: 1.021 (ref 1.005–1.030)
pH: 6 (ref 5.0–8.0)

## 2021-04-13 LAB — LACTIC ACID, PLASMA: Lactic Acid, Venous: 1.3 mmol/L (ref 0.5–1.9)

## 2021-04-13 MED ORDER — AMOXICILLIN-POT CLAVULANATE 875-125 MG PO TABS
1.0000 | ORAL_TABLET | Freq: Two times a day (BID) | ORAL | 0 refills | Status: DC
  Filled 2021-04-13: qty 14, 7d supply, fill #0

## 2021-04-13 MED ORDER — TETANUS-DIPHTHERIA TOXOIDS TD 5-2 LFU IM INJ
0.5000 mL | INJECTION | Freq: Once | INTRAMUSCULAR | Status: AC
  Administered 2021-04-13: 0.5 mL via INTRAMUSCULAR
  Filled 2021-04-13: qty 0.5

## 2021-04-13 NOTE — Discharge Instructions (Addendum)
Your exam today was reassuring.  You received antibiotics for skin infection following cat bite.  He also had your tetanus shot updated today.  If you have any worsening symptoms such as fever, worsening pain, inability to walk, or worsening redness please return for evaluation.  Otherwise I recommend you follow-up with PCP to ensure this is improving. ?

## 2021-04-13 NOTE — ED Triage Notes (Signed)
Pt via pov from home with cat bite on Tuesday. Pt had previous bite and took amoxicillin with good results. Pt reports that the leg became red and painful yesterday and is more painful and redder today. Pt alert & oriented, nad noted.  ?

## 2021-04-13 NOTE — ED Provider Notes (Addendum)
?[facility] EMERGENCY DEPT ?Provider Note ? ? ?CSN: [id number] ?Arrival date & time: 04/13/21  1050 ? ?  ? ?History ? ?Chief Complaint  ?Patient presents with  ? Animal Bite  ? ? ?Danny Gross is a 87 y.o. male. ? ?87 year old male presents today for evaluation following cat bite that took place Tuesday.  He states this was his neighbors cat.  Endorses erythema and pain in the right lower extremity otherwise denies fever, chills, other systemic symptoms.  Patient has been able to ambulate without difficulty.  Denies other complaints.  Does endorse history of cat bite 1 year ago which resolved with amoxicillin.  ? ?The history is provided by the patient. No language interpreter was used.  ? ?  ? ?Home Medications ?Prior to Admission medications   ?Medication Sig Start Date End Date Taking? Authorizing Provider  ?acetaminophen (TYLENOL) 500 MG tablet Take 500 mg by mouth every 6 (six) hours as needed for mild pain.    [provider]  ?benzonatate (TESSALON) 200 MG capsule Take 1 capsule (200 mg total) by mouth 2 (two) times daily as needed for cough. 12/26/20   Myrlene Broker, MD  ?brimonidine-timolol (COMBIGAN) 0.2-0.5 % ophthalmic solution Place 1 drop into both eyes every 12 (twelve) hours.    [provider]  ?co-enzyme Q-10 30 MG capsule Take 30 mg by mouth daily.    [provider]  ?dorzolamide (TRUSOPT) 2 % ophthalmic solution Place 1 drop into both eyes 2 (two) times daily.    [provider]  ?guaiFENesin (MUCINEX) 600 MG 12 hr tablet Take 600 mg by mouth 2 (two) times daily as needed.    [provider]  ?oseltamivir (TAMIFLU) 75 MG capsule Take 1 capsule (75 mg total) by mouth every 12 (twelve) hours. 12/21/20   Bethann Berkshire, MD  ?promethazine-dextromethorphan (PROMETHAZINE-DM) 6.25-15 MG/5ML syrup Take 5 mLs by mouth at bedtime as needed for cough. 12/26/20   Myrlene Broker, MD  ?ROCKLATAN 0.02-0.005 % SOLN Apply 1 drop to eye  at bedtime. 12/05/20   [provider]  ?   ? ?Allergies    ?Patient has no known allergies.   ? ?Review of Systems   ?Review of Systems  ?Constitutional:  Negative for chills and fever.  ?Respiratory:  Negative for cough and shortness of breath.   ?Skin:  Positive for rash and wound.  ?All other systems reviewed and are negative. ? ?Physical Exam ?Updated Vital Signs ?BP (!) 162/78 (BP Location: Right Arm)   Pulse 71   Temp 98.3 ?F (36.8 ?C)   Resp 18   Ht 5\' 9"  (1.753 m)   Wt 63.5 kg   SpO2 97%   BMI 20.67 kg/m?  ?Physical Exam ?Vitals and nursing note reviewed.  ?Constitutional:   ?   General: He is not in acute distress. ?   Appearance: Normal appearance. He is not ill-appearing.  ?HENT:  ?   Head: Normocephalic and atraumatic.  ?   Nose: Nose normal.  ?Eyes:  ?   Conjunctiva/sclera: Conjunctivae normal.  ?Cardiovascular:  ?   Rate and Rhythm: Normal rate and regular rhythm.  ?Pulmonary:  ?   Effort: Pulmonary effort is normal. No respiratory distress.  ?   Breath sounds: No wheezing or rales.  ?Musculoskeletal:     ?   General: No deformity.  ?   Comments: Bilateral lower extremities with 5/5 strength.  Full range of motion in bilateral hips, knees, and pulse and brisk cap refill  bilaterally.  Area of puncture wounds noted in picture below.  Without open wound or drainage.  ?Skin: ?   Findings: No rash.  ?Neurological:  ?   Mental Status: He is alert.  ? ? ? ? ? ? ?ED Results / Procedures / Treatments   ?Labs ?(all labs ordered are listed, but only abnormal results are displayed) ?Labs Reviewed  ?COMPREHENSIVE METABOLIC PANEL - Abnormal; Notable for the following components:  ?    Result Value  ? Glucose, Bld 111 (*)   ? BUN 26 (*)   ? Total Bilirubin 2.0 (*)   ? All other components within normal limits  ?CBC WITH DIFFERENTIAL/PLATELET - Abnormal; Notable for the following components:  ? Platelets 108 (*)   ? Neutro Abs 8.0 (*)   ? All other components within normal limits  ?URINALYSIS, ROUTINE  W REFLEX MICROSCOPIC - Abnormal; Notable for the following components:  ? Protein, ur TRACE (*)   ? All other components within normal limits  ?LACTIC ACID, PLASMA  ? ? ?EKG ?None ? ?Radiology ?No results found. ? ?Procedures ?Procedures  ? ? ?Medications Ordered in ED ?Medications - No data to display ? ?ED Course/ Medical Decision Making/ A&P ?  ?                        ?Medical Decision Making ?Amount and/or Complexity of Data Reviewed ?Labs: ordered. ? ?Risk ?Prescription drug management. ? ? ?87 year old male presents today following Pain.  This occurred on Tuesday.  Has some localized erythema.  He is unsure of his last tetanus shot.  Will provide 1 today.  Patient has reassuring exam is able to ambulate without difficulty.  He is without any systemic symptoms.  Return precautions discussed including fever, worsening erythema, difficulty ambulating.  Both patient and his wife who is at bedside voiced understanding and are in agreement with plan.  Joints with full range of motion and without tenderness to palpation.  Patient states that the ulnar told him was fully vaccinated.  Vasculitis considered however since erythema started right after cat bite we will treat as infection. ? ? ?Final Clinical Impression(s) / ED Diagnoses ?Final diagnoses:  ?Cat bite, initial encounter  ? ? ?Rx / DC Orders ?ED Discharge Orders   ? ?      Ordered  ?  amoxicillin-clavulanate (AUGMENTIN) 875-125 MG tablet  Every 12 hours       ? 04/13/21 1243  ? ?  ?  ? ?  ? ? ?  ?Marita Kansas, PA-C ?04/13/21 1245 ? ?  ?Marita Kansas, PA-C ?04/13/21 1245 ? ?  ?Jacalyn Lefevre, MD ?04/13/21 1542 ? ?

## 2021-04-18 ENCOUNTER — Other Ambulatory Visit: Payer: Self-pay | Admitting: *Deleted

## 2021-04-18 DIAGNOSIS — I7121 Aneurysm of the ascending aorta, without rupture: Secondary | ICD-10-CM

## 2021-05-29 ENCOUNTER — Ambulatory Visit: Payer: Medicare PPO | Admitting: Physician Assistant

## 2021-05-29 ENCOUNTER — Ambulatory Visit
Admission: RE | Admit: 2021-05-29 | Discharge: 2021-05-29 | Disposition: A | Discharge status: Home / Self Care | Payer: Medicare PPO | Source: Ambulatory Visit | Attending: Cardiothoracic Surgery | Admitting: Cardiothoracic Surgery

## 2021-05-29 VITALS — BP 151/75 | HR 53 | Resp 20 | Ht 69.0 in | Wt 139.0 lb

## 2021-05-29 DIAGNOSIS — I712 Thoracic aortic aneurysm, without rupture, unspecified: Secondary | ICD-10-CM | POA: Diagnosis not present

## 2021-05-29 DIAGNOSIS — J479 Bronchiectasis, uncomplicated: Secondary | ICD-10-CM | POA: Diagnosis not present

## 2021-05-29 DIAGNOSIS — I7121 Aneurysm of the ascending aorta, without rupture: Secondary | ICD-10-CM

## 2021-05-29 DIAGNOSIS — M47814 Spondylosis without myelopathy or radiculopathy, thoracic region: Secondary | ICD-10-CM | POA: Diagnosis not present

## 2021-05-29 DIAGNOSIS — J189 Pneumonia, unspecified organism: Secondary | ICD-10-CM | POA: Diagnosis not present

## 2021-05-29 NOTE — Patient Instructions (Signed)
Patient is counseled regarding the importance of long term risk factor modification as they pertain to the presence of ischemic heart disease including avoiding the use of all tobacco products, dietary modifications and medical therapy for diabetes, cholesterol and lipid management, and regular exercise.     AVOID FLOUROQUINOLONES, AS THEY INCREASE THE RISK OF AORTIC DISSECTION

## 2021-05-29 NOTE — Progress Notes (Signed)
[address]       [place] [address]             [phone]      [name] [id number] 05-Aug-1934   History of Present Illness:  Danny Gross is an 87 yo male with history of 4.1 cm Ascending Aortic Aneurysm.  He presents today for 18 month follow up with repeat CT of the chest.  Overall the patient is doing well.  He denies chest pain and shortness of breath.  He remains active with running and swimming on a daily basis.  He has never smoked.  Current Outpatient Medications on File Prior to Visit  Medication Sig Dispense Refill   acetaminophen (TYLENOL) 500 MG tablet Take 500 mg by mouth every 6 (six) hours as needed for mild pain.     amoxicillin-clavulanate (AUGMENTIN) 875-125 MG tablet Take 1 tablet by mouth every 12 (twelve) hours. 14 tablet 0   benzonatate (TESSALON) 200 MG capsule Take 1 capsule (200 mg total) by mouth 2 (two) times daily as needed for cough. 20 capsule 0   brimonidine-timolol (COMBIGAN) 0.2-0.5 % ophthalmic solution Place 1 drop into both eyes every 12 (twelve) hours.     co-enzyme Q-10 30 MG capsule Take 30 mg by mouth daily.     dorzolamide (TRUSOPT) 2 % ophthalmic solution Place 1 drop into both eyes 2 (two) times daily.     guaiFENesin (MUCINEX) 600 MG 12 hr tablet Take 600 mg by mouth 2 (two) times daily as needed.     oseltamivir (TAMIFLU) 75 MG capsule Take 1 capsule (75 mg total) by mouth every 12 (twelve) hours. 10 capsule 0   promethazine-dextromethorphan (PROMETHAZINE-DM) 6.25-15 MG/5ML syrup Take 5 mLs by mouth at bedtime as needed for cough. 100 mL 0   ROCKLATAN 0.02-0.005 % SOLN Apply 1 drop to eye at bedtime.     No current facility-administered medications on file prior to visit.    Physical Exam  BP (!) 151/75 (BP Location: Left Arm, Patient Position: Sitting)   Pulse (!) 53   Resp 20   Ht 5\' 9"  (1.753 m)   Wt 139 lb (63 kg)   SpO2 96% Comment: RA  BMI 20.53 kg/m   Gen: NAD Heart:RRR Lungs: CTA  bilaterally Neck: no carotid bruit Abd: soft non-tender, non-distended Ext: no edema, + varicose veins Neuro: grossly intact  CTA Results:  FINDINGS: Cardiovascular: Normal heart size. No significant pericardial effusion/thickening. Atherosclerotic thoracic aorta with dilated 4.4 cm ascending thoracic aorta, previously 4.2 cm using similar measurement technique on 11/30/2019 CT, slightly increased. Aortic isthmus diameter 3.4 cm. Maximum descending thoracic aortic diameter 3.4 cm. Aortic diameter 3.0 cm at diaphragmatic hiatus. Normal caliber main pulmonary artery.   Mediastinum/Nodes: No discrete thyroid nodules. Unremarkable esophagus. No pathologically enlarged axillary, mediastinal or hilar lymph nodes, noting limited sensitivity for the detection of hilar adenopathy on this noncontrast study.   Lungs/Pleura: No pneumothorax. No pleural effusion. Widespread patchy moderate cylindrical bronchiectasis in both lungs with associated prominent scattered mucoid impaction, most prominent in the right middle lobe, bilateral lower lobes and basilar right upper lobe. Thick bandlike subpleural consolidation with associated volume loss in the anteromedial basilar right upper lobe is unchanged. Mild to moderate patchy tree-in-bud opacities at the areas of bronchiectasis are again noted. Tree-in-bud opacities have mildly improved in the left lower lobe. New mild bandlike consolidation in the medial basilar left lower lobe. Otherwise no substantial changes since 11/30/2019 CT.  Upper abdomen: Simple 4.0 cm posterior interpolar left renal cyst, for which no follow-up is recommended.   Musculoskeletal: No aggressive appearing focal osseous lesions. Mild thoracic spondylosis.   IMPRESSION: 1. Dilated 4.4 cm diameter ascending thoracic aorta, slightly increased. Recommend annual imaging followup by CTA or MRA. This recommendation follows 2010 ACCF/AHA/AATS/ACR/ASA/SCA/SCAI/SIR/STS/SVM  Guidelines for the Diagnosis and Management of Patients with Thoracic Aortic Disease. Circulation. 2010; 121: Z009-Q330. Aortic aneurysm NOS (ICD10-I71.9). 2. 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 Mild waxing and waning interval changes in the left lower lobe. Overall relatively stable findings. 3. Aortic Atherosclerosis (ICD10-I70.0).     Electronically Signed   By: Delbert Phenix M.D.   On: 05/29/2021 13:29    A/P:  Ascending Aortic Aneurysm measuring 4.4 cm, which is a slight interval growth from 4.1 cm. Despite patient being very active with patient's advanced age he is likely not a surgical candidate should the aneurysm continue to enlarge Elevated BP- patient has no previous history of HTN, however SBP in the office today is elevated >150... he was educated on the importance of good BP control to ensure no further growth of his aortic aneurysm.. he was instructed to set up a follow up appointment with his PCP in the next week or 2 to get his BP checked and if needed initiate anti-hypertensive therapy RTC in 1 year with repeat CT chest  Risk Modification:  Statin:  No  Smoking cessation instruction/counseling given:  Non- Smoker  Patient was counseled on importance of Blood Pressure Control.  Despite Medical intervention if the patient notices persistently elevated blood pressure readings.  They are instructed to contact their Primary Care Physician  Please avoid use of Fluoroquinolones as this can potentially increase your risk of Aortic Rupture and/or Dissection  Patient educated on signs and symptoms of Aortic Dissection, handout also provided in AVS  Jakel Alphin, PA-C 05/29/21

## 2021-06-06 ENCOUNTER — Encounter: Payer: Self-pay | Admitting: Internal Medicine

## 2021-06-06 ENCOUNTER — Ambulatory Visit: Payer: Medicare PPO | Admitting: Internal Medicine

## 2021-06-06 DIAGNOSIS — I712 Thoracic aortic aneurysm, without rupture, unspecified: Secondary | ICD-10-CM

## 2021-06-06 MED ORDER — AMLODIPINE BESYLATE 5 MG PO TABS: 5.0000 mg | ORAL_TABLET | Freq: Every day | ORAL | 1 refills | Status: DC

## 2021-06-06 NOTE — Patient Instructions (Signed)
We will start amlodipine 5 mg daily and let us know in 2-3 weeks how this is doing.

## 2021-06-06 NOTE — Progress Notes (Unsigned)
   Subjective:   Patient ID: Danny Gross, male    DOB: [birth year], 87 y.o.   MRN: [id number]  HPI The patient is an 87 YO man coming in for BP management. Due to enlarging thoracic aneurysm CT surgery wants BP goal <130/80. Home readings mostly 140s/70s  Review of Systems  Constitutional: Negative.   HENT: Negative.    Eyes: Negative.   Respiratory:  Positive for cough. Negative for chest tightness and shortness of breath.   Cardiovascular:  Negative for chest pain, palpitations and leg swelling.  Gastrointestinal:  Negative for abdominal distention, abdominal pain, constipation, diarrhea, nausea and vomiting.  Musculoskeletal: Negative.   Skin: Negative.   Neurological: Negative.   Psychiatric/Behavioral: Negative.     Objective:  Physical Exam Constitutional:      Appearance: Normal appearance.  HENT:     Head: Normocephalic.  Cardiovascular:     Rate and Rhythm: Normal rate and regular rhythm.  Pulmonary:     Effort: Pulmonary effort is normal.  Musculoskeletal:        General: Normal range of motion.  Skin:    General: Skin is warm and dry.  Neurological:     General: No focal deficit present.     Mental Status: He is alert and oriented to person, place, and time.    Vitals:   06/06/21 1454  BP: (!) 150/80  Pulse: 64  Resp: 18  SpO2: 94%  Weight: 139 lb 12.8 oz (63.4 kg)  Height: 5\' 9"  (1.753 m)    Assessment & Plan:

## 2021-06-07 DIAGNOSIS — I712 Thoracic aortic aneurysm, without rupture, unspecified: Secondary | ICD-10-CM | POA: Insufficient documentation

## 2021-06-07 NOTE — Assessment & Plan Note (Signed)
Size 4.4 cm on most recent CT scan. Previously 4.2 cm. BP goal is now more aggressive to prevent growth to 130/80. BP is elevated above that goal. Will start amlodipine 5 mg daily and he will let us know in 2-3 weeks about BP readings. Adjust as needed. Should not start beta blocker given resting HR 50s-60s.

## 2021-06-13 ENCOUNTER — Encounter: Payer: Self-pay | Admitting: Internal Medicine

## 2021-06-19 ENCOUNTER — Ambulatory Visit: Payer: Medicare PPO | Admitting: Internal Medicine

## 2021-07-31 ENCOUNTER — Ambulatory Visit: Payer: Medicare PPO

## 2021-08-15 IMAGING — US US ABDOMEN COMPLETE
1 series · 13 of 25 positions shown · non-contrast
Comparison: None.

CLINICAL DATA: Two years of abdominal distension possible Gaucher's
disease.

EXAM:
ABDOMEN ULTRASOUND COMPLETE

[Series 1: us abdomen complete · 0.17mm/px · 13 of 91 slices shown]
[im 1/91]
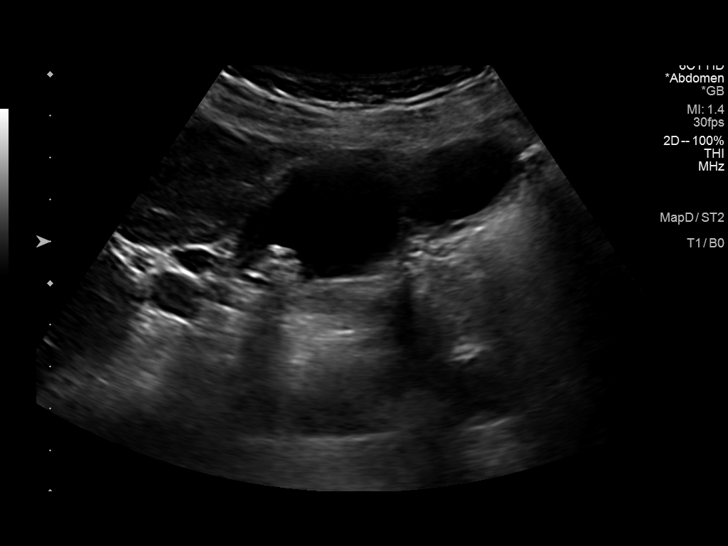
[im 8/91]
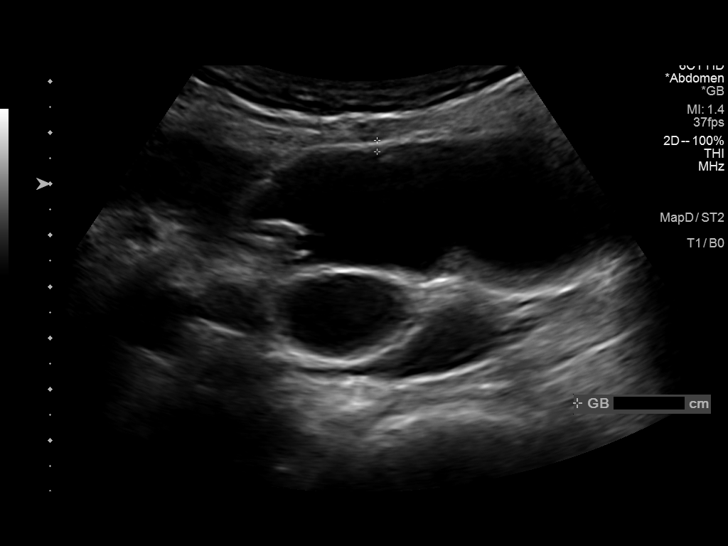
[im 16/91]
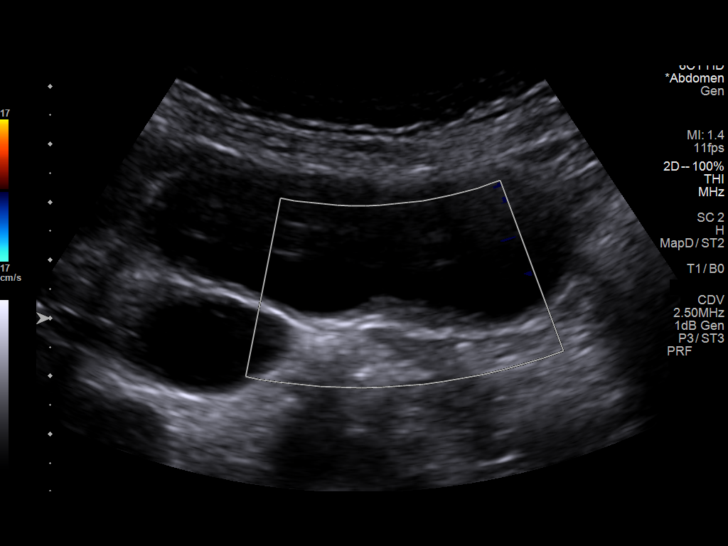
[im 23/91]
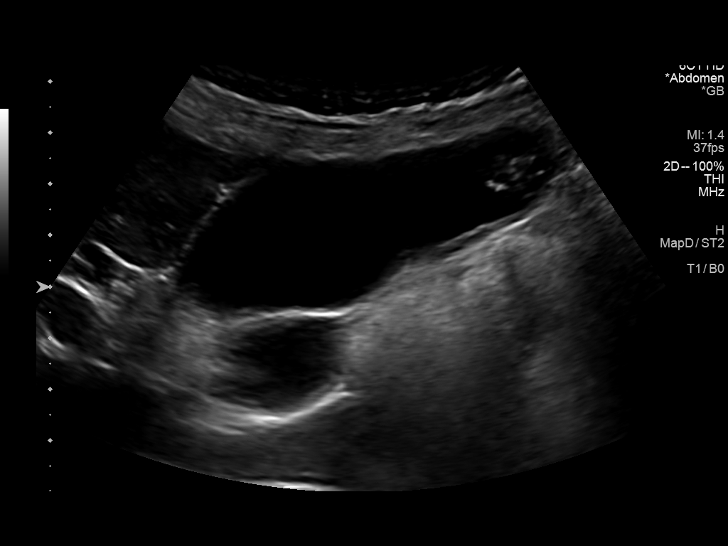
[im 31/91]
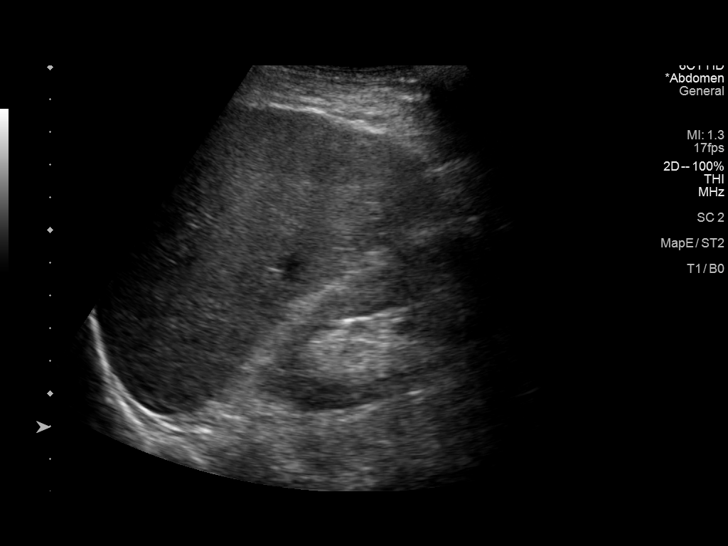
[im 38/91]
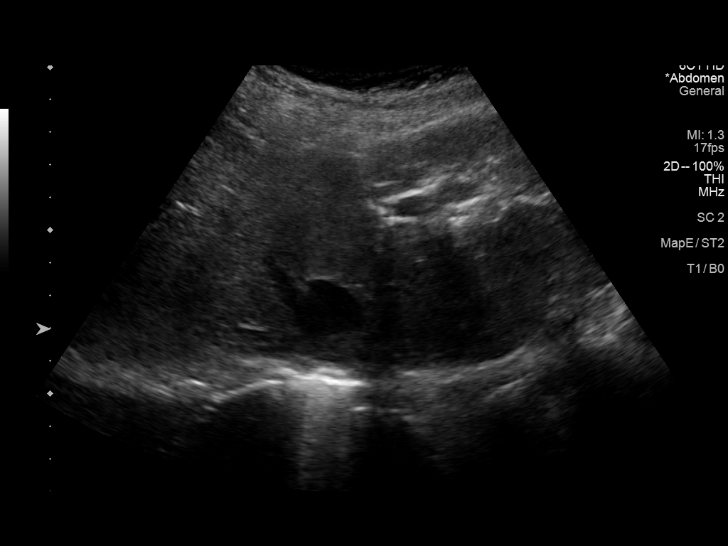
[im 46/91]
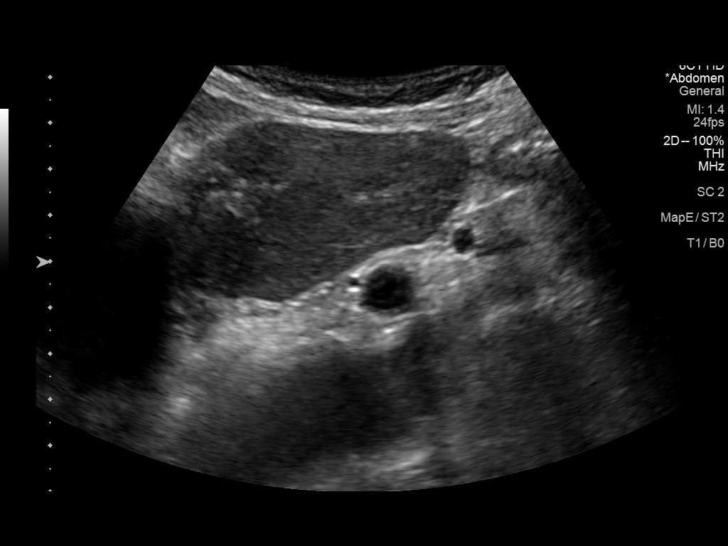
[im 53/91]
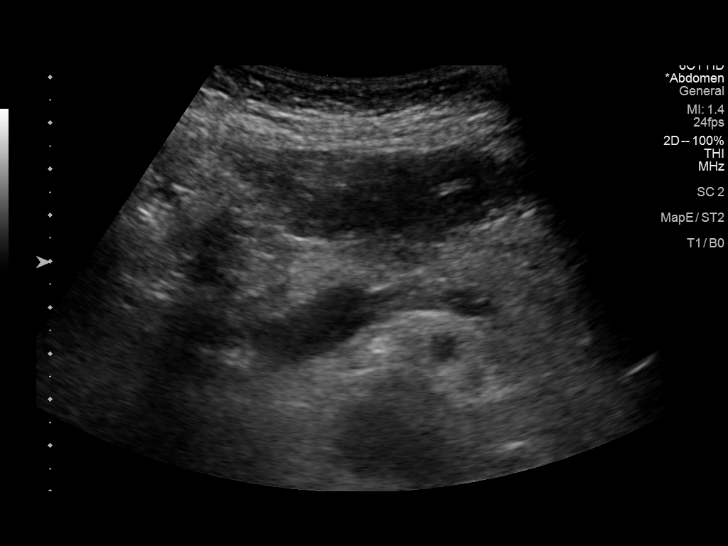
[im 61/91]
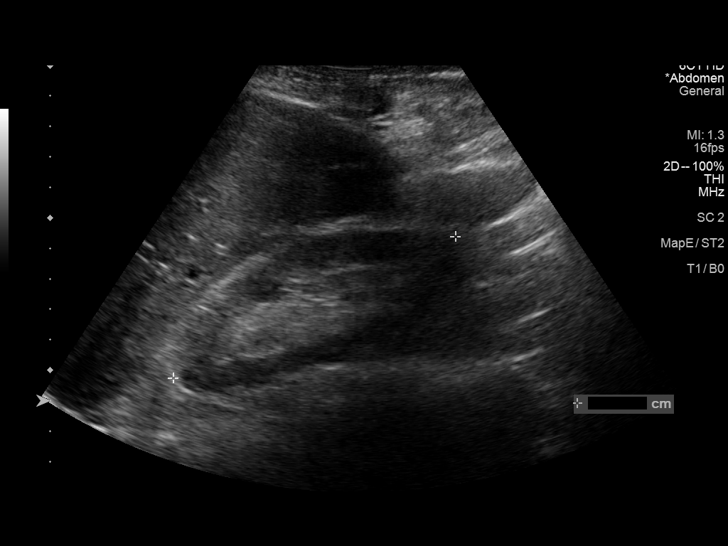
[im 68/91]
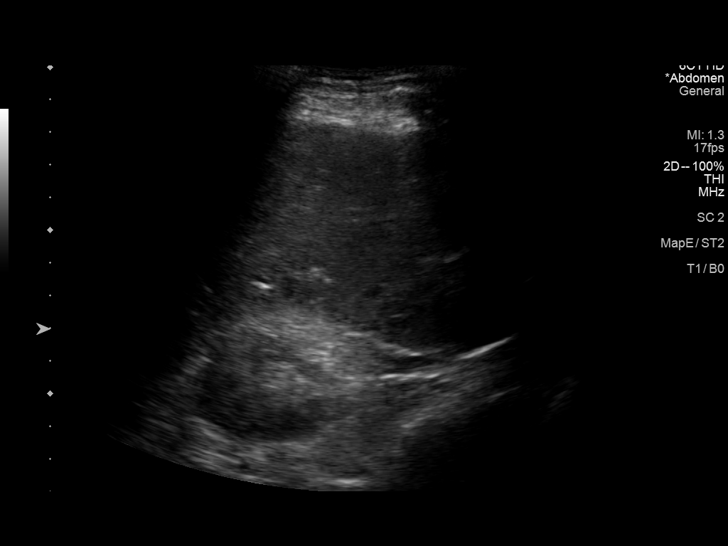
[im 76/91]
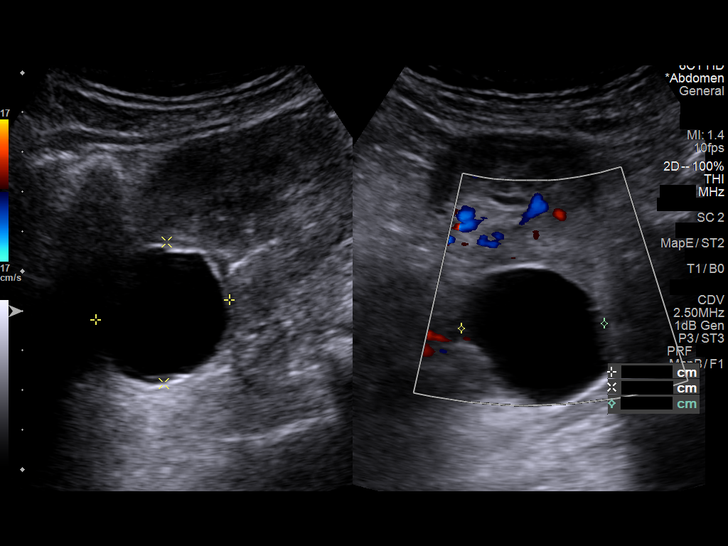
[im 83/91]
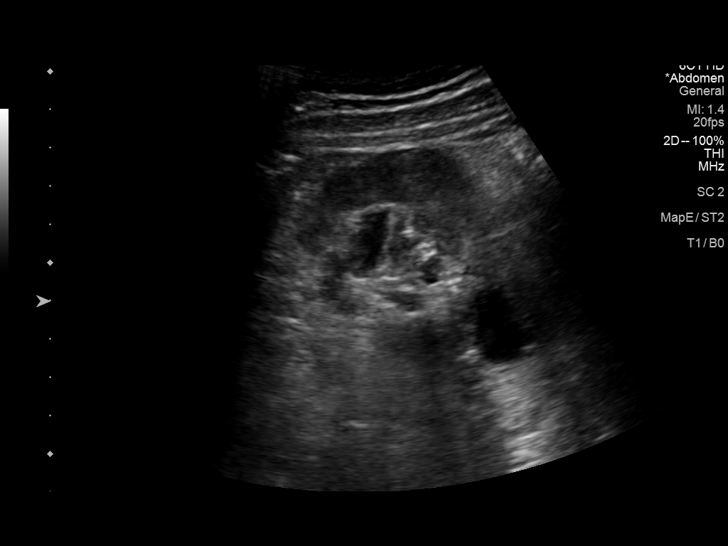
[im 91/91]
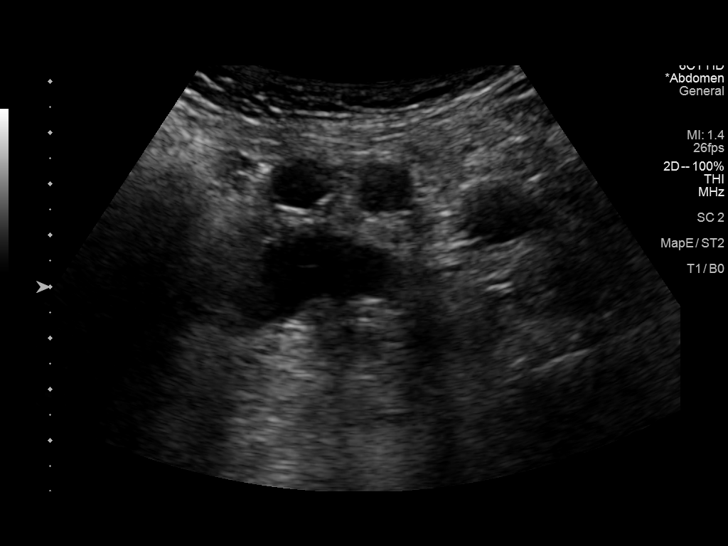

[13 of 25 positions shown; findings below may reference images not displayed]

FINDINGS: Gallbladder: Biliary sludge and mobile gallstone measuring 1.5 cm.
No pericholecystic fluid or wall thickening visualized. No
sonographic Murphy sign noted by sonographer.

Common bile duct: Diameter: 3 mm

Liver: No focal lesion identified. Mild hepatomegaly with the liver
measuring 17 cm. Heterogeneously increased echogenicity of the
hepatic parenchyma. Portal vein is patent on color Doppler imaging
with normal direction of blood flow towards the liver.

IVC: No abnormality visualized.

Pancreas: Visualized portion unremarkable.

Spleen: Size and appearance within normal limits.

Right Kidney: Length: 10.4 cm. Echogenicity within normal limits. No
mass or hydronephrosis visualized.

Left Kidney: Length: 10.2 cm. Echogenicity within normal limits.
Renal cysts measuring up to 3.6 cm. No hydronephrosis visualized.

Abdominal aorta: No aneurysm visualized.

Other findings: None.
IMPRESSION: 1. Mild hepatomegaly with heterogeneously increased echogenicity of
the hepatic parenchyma, which is nonspecific but can be seen with
fatty infiltration or other hepatocellular disease.
2. Cholelithiasis without sonographic evidence of acute
cholecystitis.

## 2021-10-06 DIAGNOSIS — H43812 Vitreous degeneration, left eye: Secondary | ICD-10-CM | POA: Diagnosis not present

## 2021-10-06 DIAGNOSIS — H401233 Low-tension glaucoma, bilateral, severe stage: Secondary | ICD-10-CM | POA: Diagnosis not present

## 2021-10-06 DIAGNOSIS — Z961 Presence of intraocular lens: Secondary | ICD-10-CM | POA: Diagnosis not present

## 2021-10-11 DIAGNOSIS — L812 Freckles: Secondary | ICD-10-CM | POA: Diagnosis not present

## 2021-10-11 DIAGNOSIS — L565 Disseminated superficial actinic porokeratosis (DSAP): Secondary | ICD-10-CM | POA: Diagnosis not present

## 2021-10-11 DIAGNOSIS — L821 Other seborrheic keratosis: Secondary | ICD-10-CM | POA: Diagnosis not present

## 2021-10-11 DIAGNOSIS — D1801 Hemangioma of skin and subcutaneous tissue: Secondary | ICD-10-CM | POA: Diagnosis not present

## 2021-10-11 DIAGNOSIS — D485 Neoplasm of uncertain behavior of skin: Secondary | ICD-10-CM | POA: Diagnosis not present

## 2021-10-11 DIAGNOSIS — C44629 Squamous cell carcinoma of skin of left upper limb, including shoulder: Secondary | ICD-10-CM | POA: Diagnosis not present

## 2021-10-11 DIAGNOSIS — L570 Actinic keratosis: Secondary | ICD-10-CM | POA: Diagnosis not present

## 2021-10-11 DIAGNOSIS — Z85828 Personal history of other malignant neoplasm of skin: Secondary | ICD-10-CM | POA: Diagnosis not present

## 2021-10-23 DIAGNOSIS — Z85828 Personal history of other malignant neoplasm of skin: Secondary | ICD-10-CM | POA: Diagnosis not present

## 2021-10-23 DIAGNOSIS — C44629 Squamous cell carcinoma of skin of left upper limb, including shoulder: Secondary | ICD-10-CM | POA: Diagnosis not present

## 2021-11-22 ENCOUNTER — Encounter: Payer: Self-pay | Admitting: Internal Medicine

## 2021-11-23 NOTE — Telephone Encounter (Signed)
Spoke with patient and was able to get him scheduled for an office visit.

## 2021-11-28 ENCOUNTER — Encounter: Payer: Self-pay | Admitting: Internal Medicine

## 2021-11-28 ENCOUNTER — Ambulatory Visit: Payer: Medicare PPO | Admitting: Internal Medicine

## 2021-11-28 VITALS — BP 122/64 | HR 66 | Temp 97.8°F | Wt 144.0 lb

## 2021-11-28 DIAGNOSIS — M791 Myalgia, unspecified site: Secondary | ICD-10-CM | POA: Diagnosis not present

## 2021-11-28 LAB — COMPREHENSIVE METABOLIC PANEL
ALT: 16 U/L (ref 0–53)
AST: 23 U/L (ref 0–37)
Albumin: 4.3 g/dL (ref 3.5–5.2)
Alkaline Phosphatase: 120 U/L — ABNORMAL HIGH (ref 39–117)
BUN: 34 mg/dL — ABNORMAL HIGH (ref 6–23)
CO2: 32 mEq/L (ref 19–32)
Calcium: 9.5 mg/dL (ref 8.4–10.5)
Chloride: 103 mEq/L (ref 96–112)
Creatinine, Ser: 1.15 mg/dL (ref 0.40–1.50)
GFR: 58.13 mL/min — ABNORMAL LOW (ref >60.00)
Glucose, Bld: 112 mg/dL — ABNORMAL HIGH (ref 70–99)
Potassium: 5.3 mEq/L — ABNORMAL HIGH (ref 3.5–5.1)
Sodium: 138 mEq/L (ref 135–145)
Total Bilirubin: 0.8 mg/dL (ref 0.2–1.2)
Total Protein: 7.2 g/dL (ref 6.0–8.3)

## 2021-11-28 LAB — CK: Total CK: 272 U/L — ABNORMAL HIGH (ref 7–232)

## 2021-11-28 LAB — MAGNESIUM: Magnesium: 2.1 mg/dL (ref 1.5–2.5)

## 2021-11-28 MED ORDER — PREDNISONE 20 MG PO TABS: 40.0000 mg | ORAL_TABLET | Freq: Every day | ORAL | 0 refills | Status: AC

## 2021-11-28 NOTE — Assessment & Plan Note (Signed)
New problem started 1 day after RSV vaccine. Persistent for 2 months or so. Checking CMP, CK, magnesium for abnormalities. Treat as appropriate. If normal will have him take 4 day course of prednisone.

## 2021-11-28 NOTE — Progress Notes (Signed)
   Subjective:   Patient ID: Danny Gross, male    DOB: 05-Mar-1934, 87 y.o.   MRN: [id number]  HPI The patient is here for new muscle pain since RSV vaccine.  Review of Systems  Constitutional: Negative.   HENT: Negative.    Eyes: Negative.   Respiratory:  Negative for cough, chest tightness and shortness of breath.   Cardiovascular:  Negative for chest pain, palpitations and leg swelling.  Gastrointestinal:  Negative for abdominal distention, abdominal pain, constipation, diarrhea, nausea and vomiting.  Musculoskeletal:  Positive for myalgias.  Skin: Negative.   Neurological: Negative.   Psychiatric/Behavioral: Negative.      Objective:  Physical Exam Constitutional:      Appearance: He is well-developed.  HENT:     Head: Normocephalic and atraumatic.  Cardiovascular:     Rate and Rhythm: Normal rate and regular rhythm.  Pulmonary:     Effort: Pulmonary effort is normal. No respiratory distress.     Breath sounds: Normal breath sounds. No wheezing or rales.  Abdominal:     General: Bowel sounds are normal. There is no distension.     Palpations: Abdomen is soft.     Tenderness: There is no abdominal tenderness. There is no rebound.  Musculoskeletal:        General: Tenderness present.     Cervical back: Normal range of motion.  Skin:    General: Skin is warm and dry.  Neurological:     Mental Status: He is alert and oriented to person, place, and time.     Coordination: Coordination normal.     Vitals:   11/28/21 1449  BP: 122/64  Pulse: 66  Temp: 97.8 F (36.6 C)  TempSrc: Oral  SpO2: 95%  Weight: 144 lb (65.3 kg)    Assessment & Plan:

## 2021-11-28 NOTE — Patient Instructions (Signed)
We will check the labs.  We have sent in prednisone to take if the labs are normal. Take 2 pills daily for 4 days.

## 2021-12-06 ENCOUNTER — Telehealth: Payer: Self-pay | Admitting: Internal Medicine

## 2021-12-06 NOTE — Telephone Encounter (Signed)
Left message for patient to call back to schedule Medicare Annual Wellness Visit   Last AWV  12/14/20  Please schedule at anytime with [facility] Advisor if patient calls the office back.      Any questions, please call me at [phone]

## 2021-12-07 ENCOUNTER — Other Ambulatory Visit: Payer: Self-pay | Admitting: Internal Medicine

## 2021-12-19 ENCOUNTER — Ambulatory Visit: Payer: Medicare PPO

## 2021-12-19 ENCOUNTER — Telehealth: Payer: Self-pay

## 2021-12-19 NOTE — Telephone Encounter (Signed)
Called patient lvm to return call, to complete AWV at [phone].  If no return call within 15 minutes, patient may reschedule for the next available appointment with NHA or CMA. -S. Tobias Avitabile,LPN  Danny Gross N. Reymundo Poll, LPN Nurse Health Advisor [employer] [facility]  Site: [facility] at [facility]

## 2022-01-25 ENCOUNTER — Encounter: Payer: Self-pay | Admitting: Family Medicine

## 2022-01-25 ENCOUNTER — Telehealth (INDEPENDENT_AMBULATORY_CARE_PROVIDER_SITE_OTHER): Payer: Medicare PPO | Admitting: Family Medicine

## 2022-01-25 VITALS — Temp 99.9°F | Ht 69.0 in

## 2022-01-25 DIAGNOSIS — U071 COVID-19: Secondary | ICD-10-CM | POA: Diagnosis not present

## 2022-01-25 MED ORDER — NIRMATRELVIR/RITONAVIR (PAXLOVID)TABLET: 3.0000 | ORAL_TABLET | Freq: Two times a day (BID) | ORAL | 0 refills | Status: AC

## 2022-01-25 NOTE — Progress Notes (Signed)
Established Patient Office Visit   Subjective:  Patient ID: Danny Gross, male    DOB: [birth year]  Age: 88 y.o. MRN: [id number]  Chief Complaint  Patient presents with   Covid Positive    Cough, fatigue, runny nose low grade fever symptoms x 1 days covid positive last night.     HPI Encounter Diagnoses  Name Primary?   COVID-19 Yes   Presents with a 1 day history of fatigue, weakness and malaise with nasal congestion and runny nose.  Occasional cough.  He denies fevers chills, wheezing or difficulty breathing, myalgias or arthralgias.  He tested positive for COVID today.  He is up-to-date with COVID vaccinations.  He has never smoked.  Past medical history of atypical mycobacterial infection under ongoing surveillance.  He has taken Paxlovid in the past without issue.   Review of Systems  Constitutional:  Positive for malaise/fatigue. Negative for chills, diaphoresis and fever.  HENT:  Positive for congestion. Negative for sore throat.   Eyes:  Negative for blurred vision, discharge and redness.  Respiratory:  Positive for cough. Negative for sputum production, shortness of breath and wheezing.   Cardiovascular: Negative.   Gastrointestinal:  Negative for abdominal pain.  Genitourinary: Negative.   Musculoskeletal: Negative.  Negative for joint pain and myalgias.  Skin:  Negative for rash.  Neurological:  Negative for tingling, loss of consciousness, weakness and headaches.  Endo/Heme/Allergies:  Negative for polydipsia.     Current Outpatient Medications:    amLODipine (NORVASC) 5 MG tablet, TAKE 1 TABLET (5 MG TOTAL) BY MOUTH DAILY., Disp: 90 tablet, Rfl: 1   brimonidine-timolol (COMBIGAN) 0.2-0.5 % ophthalmic solution, Place 1 drop into both eyes every 12 (twelve) hours., Disp: , Rfl:    co-enzyme Q-10 30 MG capsule, Take 30 mg by mouth daily., Disp: , Rfl:    dorzolamide (TRUSOPT) 2 % ophthalmic solution, Place 1 drop into both eyes 2 (two) times daily., Disp: , Rfl:     guaiFENesin (MUCINEX) 600 MG 12 hr tablet, Take 600 mg by mouth 2 (two) times daily as needed., Disp: , Rfl:    nirmatrelvir/ritonavir (PAXLOVID) 20 x 150 MG & 10 x 100MG  TABS, Take 3 tablets by mouth 2 (two) times daily for 5 days. (Take nirmatrelvir 150 mg two tablets twice daily for 5 days and ritonavir 100 mg one tablet twice daily for 5 days) Patient GFR is 58, Disp: 30 tablet, Rfl: 0   ROCKLATAN 0.02-0.005 % SOLN, Apply 1 drop to eye at bedtime., Disp: , Rfl:    Objective:     Temp 99.9 F (37.7 C) (Temporal)   Ht 5\' 9"  (1.753 m)   BMI 21.27 kg/m    Physical Exam Constitutional:      General: He is not in acute distress.    Appearance: Normal appearance. He is not ill-appearing, toxic-appearing or diaphoretic.  HENT:     Head: Normocephalic and atraumatic.     Right Ear: External ear normal.     Left Ear: External ear normal.  Eyes:     General: No scleral icterus.       Right eye: No discharge.        Left eye: No discharge.     Extraocular Movements: Extraocular movements intact.     Conjunctiva/sclera: Conjunctivae normal.  Pulmonary:     Effort: Pulmonary effort is normal. No respiratory distress.  Skin:    General: Skin is warm and dry.  Neurological:     Mental Status: He is  alert and oriented to person, place, and time.  Psychiatric:        Mood and Affect: Mood normal.        Behavior: Behavior normal.      No results found for any visits on 01/25/22.    The ASCVD Risk score (Arnett DK, et al., 2019) failed to calculate for the following reasons:   The 2019 ASCVD risk score is only valid for ages 59 to 42    Assessment & Plan:   COVID-19 -     nirmatrelvir/ritonavir; Take 3 tablets by mouth 2 (two) times daily for 5 days. (Take nirmatrelvir 150 mg two tablets twice daily for 5 days and ritonavir 100 mg one tablet twice daily for 5 days) Patient GFR is 58  Dispense: 30 tablet; Refill: 0    Return in about 5 days (around 01/30/2022), or if  symptoms worsen or fail to improve.  Advised him to quarantine within his house for 5 days and to wear a mask in common areas.  Libby Maw, MD  Virtual Visit via Video Note  I connected with Danny Gross on 01/25/22 at  4:00 PM EST by a video enabled telemedicine application and verified that I am speaking with the correct person using two identifiers.  Location: Patient: Home with his wife. Provider: work   I discussed the limitations of evaluation and management by telemedicine and the availability of in person appointments. The patient expressed understanding and agreed to proceed.  History of Present Illness:    Observations/Objective:   Assessment and Plan:   Follow Up Instructions:    I discussed the assessment and treatment plan with the patient. The patient was provided an opportunity to ask questions and all were answered. The patient agreed with the plan and demonstrated an understanding of the instructions.   The patient was advised to call back or seek an in-person evaluation if the symptoms worsen or if the condition fails to improve as anticipated.  I provided 20 minutes of non-face-to-face time during this encounter.   Libby Maw, MD

## 2022-02-06 DIAGNOSIS — H43812 Vitreous degeneration, left eye: Secondary | ICD-10-CM | POA: Diagnosis not present

## 2022-02-06 DIAGNOSIS — Z961 Presence of intraocular lens: Secondary | ICD-10-CM | POA: Diagnosis not present

## 2022-02-06 DIAGNOSIS — H401233 Low-tension glaucoma, bilateral, severe stage: Secondary | ICD-10-CM | POA: Diagnosis not present

## 2022-02-27 ENCOUNTER — Telehealth: Payer: Self-pay

## 2022-02-27 NOTE — Telephone Encounter (Signed)
Called patient to schedule Medicare Annual Wellness Visit (AWV). No voicemail available to leave a message.  Last date of AWV: 12/14/20  Please schedule an appointment at any time with [facility].  If any questions, please contact me.  Thank you ,  Norton Blizzard, [place] ([place])  [place] [facility] [phone]

## 2022-03-08 ENCOUNTER — Ambulatory Visit (INDEPENDENT_AMBULATORY_CARE_PROVIDER_SITE_OTHER): Payer: Medicare PPO

## 2022-03-08 ENCOUNTER — Ambulatory Visit (INDEPENDENT_AMBULATORY_CARE_PROVIDER_SITE_OTHER): Payer: Medicare PPO | Admitting: Internal Medicine

## 2022-03-08 ENCOUNTER — Encounter: Payer: Self-pay | Admitting: Internal Medicine

## 2022-03-08 VITALS — BP 120/68 | HR 55 | Temp 97.5°F | Ht 69.0 in | Wt 150.0 lb

## 2022-03-08 DIAGNOSIS — I700 Atherosclerosis of aorta: Secondary | ICD-10-CM

## 2022-03-08 DIAGNOSIS — R7301 Impaired fasting glucose: Secondary | ICD-10-CM | POA: Diagnosis not present

## 2022-03-08 DIAGNOSIS — J479 Bronchiectasis, uncomplicated: Secondary | ICD-10-CM

## 2022-03-08 DIAGNOSIS — M791 Myalgia, unspecified site: Secondary | ICD-10-CM | POA: Diagnosis not present

## 2022-03-08 DIAGNOSIS — I712 Thoracic aortic aneurysm, without rupture, unspecified: Secondary | ICD-10-CM | POA: Diagnosis not present

## 2022-03-08 DIAGNOSIS — M1711 Unilateral primary osteoarthritis, right knee: Secondary | ICD-10-CM | POA: Diagnosis not present

## 2022-03-08 DIAGNOSIS — Z0000 Encounter for general adult medical examination without abnormal findings: Secondary | ICD-10-CM

## 2022-03-08 DIAGNOSIS — M160 Bilateral primary osteoarthritis of hip: Secondary | ICD-10-CM | POA: Diagnosis not present

## 2022-03-08 LAB — LIPID PANEL
Cholesterol: 196 mg/dL (ref 0–200)
HDL: 59.1 mg/dL (ref >39.00)
LDL Cholesterol: 119 mg/dL — ABNORMAL HIGH (ref 0–99)
NonHDL: 136.65
Total CHOL/HDL Ratio: 3
Triglycerides: 90 mg/dL (ref 0.0–149.0)
VLDL: 18 mg/dL (ref 0.0–40.0)

## 2022-03-08 LAB — CBC
HCT: 43.3 % (ref 39.0–52.0)
Hemoglobin: 14.2 g/dL (ref 13.0–17.0)
MCHC: 32.9 g/dL (ref 30.0–36.0)
MCV: 90.4 fl (ref 78.0–100.0)
Platelets: 126 10*3/uL — ABNORMAL LOW (ref 150.0–400.0)
RBC: 4.79 Mil/uL (ref 4.22–5.81)
RDW: 14 % (ref 11.5–15.5)
WBC: 6.1 10*3/uL (ref 4.0–10.5)

## 2022-03-08 LAB — COMPREHENSIVE METABOLIC PANEL
ALT: 12 U/L (ref 0–53)
AST: 18 U/L (ref 0–37)
Albumin: 4 g/dL (ref 3.5–5.2)
Alkaline Phosphatase: 105 U/L (ref 39–117)
BUN: 30 mg/dL — ABNORMAL HIGH (ref 6–23)
CO2: 30 mEq/L (ref 19–32)
Calcium: 9.8 mg/dL (ref 8.4–10.5)
Chloride: 103 mEq/L (ref 96–112)
Creatinine, Ser: 1.16 mg/dL (ref 0.40–1.50)
GFR: 57.42 mL/min — ABNORMAL LOW (ref >60.00)
Glucose, Bld: 114 mg/dL — ABNORMAL HIGH (ref 70–99)
Potassium: 5.1 mEq/L (ref 3.5–5.1)
Sodium: 139 mEq/L (ref 135–145)
Total Bilirubin: 0.8 mg/dL (ref 0.2–1.2)
Total Protein: 6.8 g/dL (ref 6.0–8.3)

## 2022-03-08 LAB — HEMOGLOBIN A1C: Hgb A1c MFr Bld: 6 % (ref 4.6–6.5)

## 2022-03-08 NOTE — Patient Instructions (Signed)
We will check the x-ray of the knee and hip and get you in with sports medicine to see if we can get rid of the muscle pain.

## 2022-03-08 NOTE — Progress Notes (Signed)
Subjective:   Patient ID: Danny Gross, male    DOB: 1934/09/23, 88 y.o.   MRN: [id number]  HPI Here for medicare wellness and physical, with ongoing right muscle pain. Please see A/P for status and treatment of chronic medical problems.   Diet: heart healthy  Physical activity: walking regularly Depression/mood screen: negative Hearing: intact to whispered voice Visual acuity: grossly normal, performs annual eye exam  ADLs: capable Fall risk: none Home safety: good Cognitive evaluation: intact to orientation, naming, recall and repetition EOL planning: adv directives discussed, in place  [employer] Visit from 03/08/2022 in [place] at [place]  PHQ-2 Total Score [place] Visit from 03/08/2022 in [place] at [place]  PHQ-9 Total Score 0         04/13/2021   11:17 AM 06/06/2021    3:04 PM 11/28/2021    2:51 PM 01/25/2022    4:15 PM 03/08/2022   10:31 AM  [place] in the past year?  0 0 0 0  Was there an injury with Fall?  0 0 0 0  Fall Risk Category Calculator  0 0 0 0  Fall Risk Category (Retired)  Low Low    (RETIRED) Patient Fall Risk Level Low fall risk      Patient at Risk for Falls Due to    No Fall Risks   Fall risk Follow up   Falls evaluation completed Falls evaluation completed Falls evaluation completed    I have personally reviewed and have noted 1. The patient's medical and social history - reviewed today no changes 2. Their use of alcohol, tobacco or illicit drugs 3. Their current medications and supplements 4. The patient's functional ability including ADL's, fall risks, home safety risks and hearing or visual impairment. 5. Diet and physical activities 6. Evidence for depression or mood disorders 7. Care team reviewed and updated 8.  The patient is normal on an opioid pain medication.  Patient Care Team: Hoyt Koch, MD as PCP - General (Internal  Medicine) Warden Fillers, MD as Consulting Physician (Ophthalmology) Past Medical History:  Diagnosis Date   Cataract    Emphysema of lung [facility])    Past Surgical History:  Procedure Laterality Date   HERNIA REPAIR  2003   TONSILLECTOMY  1939   VIDEO BRONCHOSCOPY Bilateral 08/11/2013   Procedure: VIDEO BRONCHOSCOPY WITH FLUORO;  Surgeon: Tanda Rockers, MD;  Location: [facility];  Service: Cardiopulmonary;  Laterality: Bilateral;   Family History  Problem Relation Age of Onset   Cancer Maternal Aunt        ? type    Review of Systems  Constitutional: Negative.   HENT: Negative.    Eyes: Negative.   Respiratory:  Negative for cough, chest tightness and shortness of breath.   Cardiovascular:  Negative for chest pain, palpitations and leg swelling.  Gastrointestinal:  Negative for abdominal distention, abdominal pain, constipation, diarrhea, nausea and vomiting.  Musculoskeletal:  Positive for myalgias.  Skin: Negative.   Neurological: Negative.   Psychiatric/Behavioral: Negative.      Objective:  Physical Exam Constitutional:      Appearance: He is well-developed.  HENT:     Head: Normocephalic and atraumatic.  Cardiovascular:     Rate and Rhythm: Normal rate and regular rhythm.  Pulmonary:     Effort: Pulmonary effort is normal. No respiratory distress.     Breath sounds: Normal  breath sounds. No wheezing or rales.  Abdominal:     General: Bowel sounds are normal. There is no distension.     Palpations: Abdomen is soft.     Tenderness: There is no abdominal tenderness. There is no rebound.  Musculoskeletal:        General: Tenderness present.     Cervical back: Normal range of motion.  Skin:    General: Skin is warm and dry.  Neurological:     Mental Status: He is alert and oriented to person, place, and time.     Coordination: Coordination normal.     Vitals:   03/08/22 1027  BP: 120/68  Pulse: (!) 55  Temp: (!) 97.5 F (36.4 C)  TempSrc: Oral   SpO2: 99%  Weight: 150 lb (68 kg)  Height: '5\' 9"'$  (1.753 m)   Assessment & Plan:

## 2022-03-09 ENCOUNTER — Encounter: Payer: Self-pay | Admitting: Internal Medicine

## 2022-03-09 NOTE — Assessment & Plan Note (Signed)
Less coughing recently and no new SOB symptoms. Still no indication for treatment. Will continue to monitor closely for change in symptoms.

## 2022-03-09 NOTE — Assessment & Plan Note (Signed)
Since RSV vaccine about 6 months ago. Pain is located right thigh region. Checking x-ray right knee and hip to rule out arthritis changes. Referral to sports medicine to check for any muscle or tendon cause of the pain and help to treat. We did try short steroid course which did not change symptoms.

## 2022-03-09 NOTE — Assessment & Plan Note (Signed)
Checking lipid panel and not on statin currently.

## 2022-03-09 NOTE — Assessment & Plan Note (Signed)
Gets CT chest yearly through cardiology. No symptoms of rupture.

## 2022-03-09 NOTE — Assessment & Plan Note (Signed)
Checking HgA1c and adjust as needed.  

## 2022-03-09 NOTE — Assessment & Plan Note (Signed)
Flu shot yearly. Covid-19 counseled. Pneumonia complete. Shingrix complete. Tetanus due 2033. Colonoscopy aged out. Counseled about sun safety and mole surveillance. Counseled about the dangers of distracted driving. Given 10 year screening recommendations.

## 2022-03-13 ENCOUNTER — Encounter: Payer: Self-pay | Admitting: Family Medicine

## 2022-03-13 ENCOUNTER — Ambulatory Visit (INDEPENDENT_AMBULATORY_CARE_PROVIDER_SITE_OTHER): Payer: Medicare PPO | Admitting: Family Medicine

## 2022-03-13 VITALS — BP 140/78 | HR 58 | Ht 69.0 in | Wt 140.4 lb

## 2022-03-13 DIAGNOSIS — M1611 Unilateral primary osteoarthritis, right hip: Secondary | ICD-10-CM | POA: Diagnosis not present

## 2022-03-13 DIAGNOSIS — M160 Bilateral primary osteoarthritis of hip: Secondary | ICD-10-CM | POA: Insufficient documentation

## 2022-03-13 DIAGNOSIS — M25561 Pain in right knee: Secondary | ICD-10-CM | POA: Diagnosis not present

## 2022-03-13 DIAGNOSIS — G8929 Other chronic pain: Secondary | ICD-10-CM | POA: Diagnosis not present

## 2022-03-13 DIAGNOSIS — M7061 Trochanteric bursitis, right hip: Secondary | ICD-10-CM | POA: Diagnosis not present

## 2022-03-13 NOTE — Patient Instructions (Addendum)
Thank you for coming in today.   I've referred you to Physical Therapy.  Let us know if you don't hear from them in one week.   Check back in 6 weeks  Let me know if you have any problems or if its not working.

## 2022-03-13 NOTE — Progress Notes (Signed)
I, Peterson Lombard, LAT, ATC acting as a scribe for Lynne Leader, MD.  Subjective:    CC: R leg pain  HPI: Pt is an 88 y/o male c/o R leg pain x 3 months. Pt locates pain to r leg and knee. Received RSV vaccine last October, the following day he developing cramping and joint pain in the right leg. 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 Continues to run but not without pain. Sx are constant.  Pain is located primarily on the right lateral hip and in the right anterior knee. Pain is worse with activity and better with rest.  He does have some pain at bedtime.  He has not tried much treatment yet.  He is hoping it would get better with rest.  Low back pain: no Knee swelling: yes Mechanical symptoms: gives way when twisting Radiates: R LE, knee Aggravates: WB, twisting Treatments tried: prednisone  Dx imaging: 03/08/22 R hip & R knee XR  Pertinent review of Systems: No fevers or chills  Relevant historical information: Aortic atherosclerosis.   Objective:    Vitals:   03/13/22 1328  BP: (!) 140/78  Pulse: (!) 58  SpO2: 97%   General: Well Developed, well nourished, and in no acute distress.   MSK: Right hip decreased gluteal muscle bulk otherwise normal. Decreased range of motion slight limitation in flexion and internal rotation. Tender palpation greater trochanter. Hip abduction and external rotation strength is quite limited 4 -/5.  Right knee: Decreased quadricep muscle bulk compared to left. Normal knee motion with crepitation. Intact strength. Stable ligamentous exam.   Lab and Radiology Results  EXAM: RIGHT KNEE - COMPLETE 4+ VIEW   COMPARISON:  None Available.   FINDINGS: Mild-to-moderate medial joint space narrowing. Mild spur formation involving all 3 joint compartments. The lateral view is slightly oblique with no gross effusion seen. Atheromatous arterial calcifications.   IMPRESSION: Mild tricompartmental degenerative changes, as  described above.     Electronically Signed   By: Claudie Revering M.D.   On: 03/10/2022 11:01  EXAM: DG HIP (WITH OR WITHOUT PELVIS) 2-3V RIGHT   COMPARISON:  None Available.   FINDINGS: Marked right knee joint space narrowing with associated moderate spur formation and sclerosis. Milder similar changes on the left. Lower lumbar spine degenerative changes.   IMPRESSION: 1. Bilateral hip degenerative changes, greater on the right. 2. Lower lumbar spine degenerative changes.     Electronically Signed   By: Claudie Revering M.D.   On: 03/10/2022 11:03  I, Lynne Leader, personally (independently) visualized and performed the interpretation of the images attached in this note.    Impression and Recommendations:    Assessment and Plan: 88 y.o. male with right hip pain and right knee pain.  Pain started after he received the RSV vaccine.  That was over 8 months ago.  The pain today I am not sure has much to do with the RSV vaccine.  It is impossible to tell.  I will treat his current pain more consistent with musculoskeletal etiology which I think he does have..  Right hip pain: Pain is multifactorial.  He does have significant hip arthritis which may be causing some of his pain.  I expect that pain to be more anterior.  The anterior hip pain is less dominant.  He has more lateral hip pain which is thought to be related to his hip abductor and rotator weakness.  This should improve with physical therapy.  The anterior hip pain I  am afraid will not get much better as it is primarily due to DJD.  May consider steroid injection as history is needed.  The right anterior knee pain thought to be due to patellofemoral related pain and DJD.  This should improve quite a bit with quad strengthening.  Again refer to physical therapy.  Recheck in 6 weeks.  PDMP not reviewed this encounter. Orders Placed This Encounter  Procedures   Ambulatory referral to Physical Therapy    Referral Priority:    Routine    Referral Type:   Physical Medicine    Referral Reason:   Specialty Services Required    Requested Specialty:   Physical Therapy    Number of Visits Requested:   1   No orders of the defined types were placed in this encounter.   Discussed warning signs or symptoms. Please see discharge instructions. Patient expresses understanding.   The above documentation has been reviewed and is accurate and complete Lynne Leader, M.D.

## 2022-03-20 NOTE — Therapy (Unsigned)
OUTPATIENT PHYSICAL THERAPY LOWER EXTREMITY EVALUATION   Patient Name: Danny Gross MRN: [id number] DOB:1934-06-18, 88 y.o., male Today's Date: 03/21/2022  END OF SESSION:  PT End of Session - 03/21/22 1017     Visit Number 1    Number of Visits 12    Date for PT Re-Evaluation 05/02/22    Authorization Type humana - auth requested    PT Start Time 1018    PT Stop Time 1055    PT Time Calculation (min) 37 min    Activity Tolerance Patient tolerated treatment well    Behavior During Therapy [facility] for tasks assessed/performed             Past Medical History:  Diagnosis Date   Cataract    Emphysema of lung [facility])    Past Surgical History:  Procedure Laterality Date   HERNIA REPAIR  2003   TONSILLECTOMY  1939   VIDEO BRONCHOSCOPY Bilateral 08/11/2013   Procedure: VIDEO BRONCHOSCOPY WITH FLUORO;  Surgeon: Tanda Rockers, MD;  Location: [facility];  Service: Cardiopulmonary;  Laterality: Bilateral;   Patient Active Problem List   Diagnosis Date Noted   Primary osteoarthritis of right hip 03/13/2022   Myalgia 11/28/2021   Thoracic aortic aneurysm ([place]) 06/07/2021   Aortic atherosclerosis ([place]) 07/19/2020   Bronchiectasis without complication ([place]) [id number]   Impaired fasting glucose 10/04/2014   Routine general medical examination at a health care facility 12/21/2013   Pulmonary infiltrates 07/13/2013    PCP: Pricilla Holm, A MD  REFERRING PROVIDER: Gregor Hams, MD  REFERRING DIAG: M70.61 (ICD-10-CM) - Trochanteric bursitis of right hip M25.561,G89.29 (ICD-10-CM) - Chronic pain of right knee  THERAPY DIAG:  Pain in right leg  Muscle weakness (generalized)  Difficulty in walking, not elsewhere classified  Rationale for Evaluation and Treatment: Rehabilitation  ONSET DATE: 8 months ago  SUBJECTIVE:   SUBJECTIVE STATEMENT: 09/24/21 he got his first RSV shot and the next couple days he started getting leg cramping and pain and the pain has not let  up. States that he has run for 70 years and never has had any problems. States his right knee buckles on occasion.  States he has cut down his running 3x/week and some sprinting. States he as running at a higher intensity.  Has tried prednisone     PERTINENT HISTORY: Lung emphysema PAIN:  Are you having pain? Yes: NPRS scale: 3/10 Pain location: right knee joint and right hip joint Pain description: cramping,  Aggravating factors: walking, standing, running Relieving factors: rest, positioning  PRECAUTIONS: None  WEIGHT BEARING RESTRICTIONS: No  FALLS:  Has patient fallen in last 6 months? No   OCCUPATION: retired, singing, gardening, running,   PLOF: Independent  PATIENT GOALS: to have less pain    OBJECTIVE:   DIAGNOSTIC FINDINGS: 03/08/22 Pelvis xray IMPRESSION: 1. Bilateral hip degenerative changes, greater on the right. 2. Lower lumbar spine degenerative changes.   Right knee xray FINDINGS: Mild-to-moderate medial joint space narrowing. Mild spur formation involving all 3 joint compartments. The lateral view is slightly oblique with no gross effusion seen. Atheromatous arterial calcifications.   IMPRESSION: Mild tricompartmental degenerative changes, as described above.     MUSCLE LENGTH: Hamstrings: Right 30 deg; Left 30 deg   POSTURE: rounded shoulders, forward head, and flexed trunk   PALPATION: Tenderness to palpation in the right glutes and hip external rotators, hypomobility noted in right hip joint with PA    LE Measurements Lower Extremity Right 03/21/2022 Left 03/21/2022  A/PROM MMT A/PROM MMT  Hip Flexion WFL* 3+* WFL* 4  Hip Extension 0 3+ 0 3  Hip Abduction      Hip Adduction      Hip Internal rotation 10  30   Hip External rotation 45  40   Knee Flexion 125 3+ 125 4-  Knee Extension 0 4- 0 4  Ankle Dorsiflexion  4+  4+  Ankle Plantarflexion      Ankle Inversion      Ankle Eversion       (Blank rows = not tested) *  pain-twinges   Lumbar  A/PROM:    03/21/2022      Flexion 50% limited      Extension  100% limited     R ROT       L ROT       R SB  75% limited    L SB 50% limited     * Pain   (Blank rows = not tested)   LOWER EXTREMITY SPECIAL TESTS:  Neg: SLR, slump test Pos: Elys on left (can't break 90 degrees)   GAIT: Distance walked: 25 ft Assistive device utilized: None Level of assistance: Complete Independence Comments: slow, reduced gait speed, reduced trunk and hip ROM   TODAY'S TREATMENT:                                                                                                                              DATE: \  03/21/2022  Therapeutic Exercise:  Aerobic: Supine: bent knee fall outs, hip abd iso hip add iso 10 minutes total  Prone:  Seated:  Standing: Neuromuscular Re-education: Manual Therapy: Therapeutic Activity: Self Care: Trigger Point Dry Needling:  Modalities:    PATIENT EDUCATION:  Education details: on current presentation, on HEP, on clinical outcomes score and POC Person educated: Patient Education method: Explanation, Demonstration, and Handouts Education comprehension: verbalized understanding   HOME EXERCISE PROGRAM: [id number]  ASSESSMENT:  CLINICAL IMPRESSION: Patient presents to physical therapy with complaints of right lower extremity pain that started last year a couple days after RSV vaccination.  Patient presents with muscle guarding, pain, range of motion and strength deficits in right lower extremity.  Discussed findings and plan for physical therapy moving forward.  Patient with desire to continue to run and remain active which pain is currently limiting his intensity of exercise at this time.  Patient would greatly benefit from skilled physical therapy to reduce risk of further injury and return patient to optimal function.  OBJECTIVE IMPAIRMENTS: decreased activity tolerance, decreased mobility, difficulty walking, decreased ROM,  decreased strength, and pain.   ACTIVITY LIMITATIONS: standing, locomotion level, and running  PARTICIPATION LIMITATIONS: community activity, yard work, and recreational activity  PERSONAL FACTORS: Age are also affecting patient's functional outcome.   REHAB POTENTIAL: Good  CLINICAL DECISION MAKING: Stable/uncomplicated  EVALUATION COMPLEXITY: Low   GOALS: Goals reviewed with patient? yes  SHORT TERM GOALS: Target date: 04/11/2022  Patient will be independent in self management strategies to improve quality of life and functional outcomes. Baseline: New Program Goal status: INITIAL  2.  Patient will report at least 50% improvement in overall symptoms and/or function to demonstrate improved functional mobility Baseline: 0% better Goal status: INITIAL  3.  Patient will demonstrate pain-free MMT in bilateral lower extremities Baseline: Painful Goal status: INITIAL    LONG TERM GOALS: Target date: 05/02/2022   Patient will report at least 75% improvement in overall symptoms and/or function to demonstrate improved functional mobility Baseline: 0% better Goal status: INITIAL  2.  Patient will be able to walk without right lower extremity pain to improve ability to walk at home and in the community Goal status: INITIAL  3.  Patient will demonstrate at least 10 degree improvement in right hip internal rotation to demonstrate improved gross hip mobility. Baseline: See above Goal status: INITIAL   PLAN:  PT FREQUENCY: 2x/week  PT DURATION: 6 weeks  PLANNED INTERVENTIONS: Therapeutic exercises, Therapeutic activity, Neuromuscular re-education, Balance training, Gait training, Patient/Family education, Self Care, Joint mobilization, Joint manipulation, Stair training, Vestibular training, Orthotic/Fit training, DME instructions, Aquatic Therapy, Dry Needling, Electrical stimulation, Spinal manipulation, Spinal mobilization, Cryotherapy, Moist heat, Traction, Ultrasound,  Ionotophoresis '4mg'$ /ml Dexamethasone, Manual therapy, and Re-evaluation  PLAN FOR NEXT SESSION: Isometrics, range of motion in hip and lumbar spine, STM and self STM strategies, strengthening of bilateral lower extremities.  Trial right knee traction/lumbar traction   12:09 PM, 03/21/22 Jerene Pitch, DPT Physical Therapy with [facility]

## 2022-03-21 ENCOUNTER — Encounter: Payer: Self-pay | Admitting: Physical Therapy

## 2022-03-21 ENCOUNTER — Ambulatory Visit: Payer: Medicare PPO | Admitting: Physical Therapy

## 2022-03-21 DIAGNOSIS — R262 Difficulty in walking, not elsewhere classified: Secondary | ICD-10-CM | POA: Diagnosis not present

## 2022-03-21 DIAGNOSIS — M6281 Muscle weakness (generalized): Secondary | ICD-10-CM

## 2022-03-21 DIAGNOSIS — M79604 Pain in right leg: Secondary | ICD-10-CM | POA: Diagnosis not present

## 2022-03-26 ENCOUNTER — Encounter: Payer: Self-pay | Admitting: Physical Therapy

## 2022-03-26 ENCOUNTER — Ambulatory Visit: Payer: Medicare PPO | Admitting: Physical Therapy

## 2022-03-26 DIAGNOSIS — R262 Difficulty in walking, not elsewhere classified: Secondary | ICD-10-CM

## 2022-03-26 DIAGNOSIS — M79604 Pain in right leg: Secondary | ICD-10-CM | POA: Diagnosis not present

## 2022-03-26 DIAGNOSIS — M6281 Muscle weakness (generalized): Secondary | ICD-10-CM | POA: Diagnosis not present

## 2022-03-26 NOTE — Therapy (Signed)
OUTPATIENT PHYSICAL THERAPY TREATMENT NOTE   Patient Name: Danny Gross MRN: [id number] DOB:05-18-1934, 88 y.o., male Today's Date: 03/26/2022  PCP: Pricilla Holm, Loni Muse MD   REFERRING PROVIDER: Gregor Hams, MD  END OF SESSION:   PT End of Session - 03/26/22 1346     Visit Number 2    Number of Visits 12    Date for PT Re-Evaluation 05/02/22    Authorization Type humana - auth requested    PT Start Time 1346    PT Stop Time 1425    PT Time Calculation (min) 39 min    Activity Tolerance Patient tolerated treatment well    Behavior During Therapy [facility] for tasks assessed/performed             Past Medical History:  Diagnosis Date   Cataract    Emphysema of lung [facility])    Past Surgical History:  Procedure Laterality Date   HERNIA REPAIR  2003   TONSILLECTOMY  1939   VIDEO BRONCHOSCOPY Bilateral 08/11/2013   Procedure: VIDEO BRONCHOSCOPY WITH FLUORO;  Surgeon: Tanda Rockers, MD;  Location: [facility];  Service: Cardiopulmonary;  Laterality: Bilateral;   Patient Active Problem List   Diagnosis Date Noted   Primary osteoarthritis of right hip 03/13/2022   Myalgia 11/28/2021   Thoracic aortic aneurysm ([place]) 06/07/2021   Aortic atherosclerosis ([place]) 07/19/2020   Bronchiectasis without complication ([place]) [id number]   Impaired fasting glucose 10/04/2014   Routine general medical examination at a health care facility 12/21/2013   Pulmonary infiltrates 07/13/2013     THERAPY DIAG:  Pain in right leg  Muscle weakness (generalized)  Difficulty in walking, not elsewhere classified    REFERRING DIAG: M70.61 (ICD-10-CM) - Trochanteric bursitis of right hip M25.561,G89.29 (ICD-10-CM) - Chronic pain of right knee    Rationale for Evaluation and Treatment: Rehabilitation   ONSET DATE: 8 months ago   SUBJECTIVE:    SUBJECTIVE STATEMENT: 03/26/2022 No change in symptoms.   09/24/21 he got his first RSV shot and the next couple days he started getting leg  cramping and pain and the pain has not let up. States that he has run for 70 years and never has had any problems. States his right knee buckles on occasion.  States he has cut down his running 3x/week and some sprinting. States he as running at a higher intensity.  Has tried prednisone         PERTINENT HISTORY: Lung emphysema PAIN:  Are you having pain? Yes: NPRS scale: 3/10 Pain location: right knee joint and right hip joint Pain description: cramping,  Aggravating factors: walking, standing, running Relieving factors: rest, positioning   PRECAUTIONS: None   WEIGHT BEARING RESTRICTIONS: No   FALLS:  Has patient fallen in last 6 months? No     OCCUPATION: retired, singing, gardening, running,    PLOF: Independent   PATIENT GOALS: to have less pain      OBJECTIVE:    DIAGNOSTIC FINDINGS: 03/08/22 Pelvis xray IMPRESSION: 1. Bilateral hip degenerative changes, greater on the right. 2. Lower lumbar spine degenerative changes.   Right knee xray FINDINGS: Mild-to-moderate medial joint space narrowing. Mild spur formation involving all 3 joint compartments. The lateral view is slightly oblique with no gross effusion seen. Atheromatous arterial calcifications.   IMPRESSION: Mild tricompartmental degenerative changes, as described above.         MUSCLE LENGTH: Hamstrings: Right 30 deg; Left 30 deg     POSTURE: rounded shoulders, forward head, and flexed  trunk    PALPATION: Tenderness to palpation in the right glutes and hip external rotators, hypomobility noted in right hip joint with PA       LE Measurements       Lower Extremity Right 03/21/2022 Left 03/21/2022    A/PROM MMT A/PROM MMT  Hip Flexion WFL* 3+* WFL* 4  Hip Extension 0 3+ 0 3  Hip Abduction          Hip Adduction          Hip Internal rotation 10   30    Hip External rotation 45   40    Knee Flexion 125 3+ 125 4-  Knee Extension 0 4- 0 4  Ankle Dorsiflexion   4+   4+  Ankle  Plantarflexion          Ankle Inversion          Ankle Eversion           (Blank rows = not tested) * pain-twinges              Lumbar  A/PROM:    03/21/2022      Flexion 50% limited      Extension  100% limited     R ROT       L ROT       R SB  75% limited    L SB 50% limited                          * Pain              (Blank rows = not tested)     LOWER EXTREMITY SPECIAL TESTS:  Neg: SLR, slump test Pos: Elys on left (can't break 90 degrees)     GAIT: Distance walked: 25 ft Assistive device utilized: None Level of assistance: Complete Independence Comments: slow, reduced gait speed, reduced trunk and hip ROM     TODAY'S TREATMENT:                                                                                                                              DATE: \ 03/26/2022    Therapeutic Exercise:    Aerobic: Supine: bridges 2x10, piriformis stretch x4 30" holds B, bent knee fall outs/ins 3 minutes B, thomas stretch x3 30" hold Prone:    Seated: piriformis stretch x4 30" holdsB, self mobilization with tiger tail 5 minutes R leg     Standing: Neuromuscular Re-education: Manual Therapy: Therapeutic Activity: Self Care: Trigger Point Dry Needling:  Modalities:      PATIENT EDUCATION:  Education details: on HEP,on anatomy and rationale for interventions Person educated: Patient Education method: Explanation, Demonstration, and Handouts Education comprehension: verbalized understanding     HOME EXERCISE PROGRAM: [id number]   ASSESSMENT:   CLINICAL IMPRESSION: 03/26/2022 Session focused on answering questions and progressing HEP as tolerated. No pain noted during exercises. Added  new exercises to HEP. Patient very limited in hip ROM and compensates with lumbar ROM bilaterally. Educated patient on this and importance of hip mobility with running/jogging. Overall patient would continue ot benefit from skilled PT to improve overall function and QOL.   Eval:  Patient presents to physical therapy with complaints of right lower extremity pain that started last year a couple days after RSV vaccination.  Patient presents with muscle guarding, pain, range of motion and strength deficits in right lower extremity.  Discussed findings and plan for physical therapy moving forward.  Patient with desire to continue to run and remain active which pain is currently limiting his intensity of exercise at this time.  Patient would greatly benefit from skilled physical therapy to reduce risk of further injury and return patient to optimal function.   OBJECTIVE IMPAIRMENTS: decreased activity tolerance, decreased mobility, difficulty walking, decreased ROM, decreased strength, and pain.    ACTIVITY LIMITATIONS: standing, locomotion level, and running   PARTICIPATION LIMITATIONS: community activity, yard work, and recreational activity   PERSONAL FACTORS: Age are also affecting patient's functional outcome.    REHAB POTENTIAL: Good   CLINICAL DECISION MAKING: Stable/uncomplicated   EVALUATION COMPLEXITY: Low     GOALS: Goals reviewed with patient? yes   SHORT TERM GOALS: Target date: 04/11/2022  Patient will be independent in self management strategies to improve quality of life and functional outcomes. Baseline: New Program Goal status: INITIAL   2.  Patient will report at least 50% improvement in overall symptoms and/or function to demonstrate improved functional mobility Baseline: 0% better Goal status: INITIAL   3.  Patient will demonstrate pain-free MMT in bilateral lower extremities Baseline: Painful Goal status: INITIAL       LONG TERM GOALS: Target date: 05/02/2022    Patient will report at least 75% improvement in overall symptoms and/or function to demonstrate improved functional mobility Baseline: 0% better Goal status: INITIAL   2.  Patient will be able to walk without right lower extremity pain to improve ability to walk at home and in the  community Goal status: INITIAL   3.  Patient will demonstrate at least 10 degree improvement in right hip internal rotation to demonstrate improved gross hip mobility. Baseline: See above Goal status: INITIAL     PLAN:   PT FREQUENCY: 2x/week   PT DURATION: 6 weeks   PLANNED INTERVENTIONS: Therapeutic exercises, Therapeutic activity, Neuromuscular re-education, Balance training, Gait training, Patient/Family education, Self Care, Joint mobilization, Joint manipulation, Stair training, Vestibular training, Orthotic/Fit training, DME instructions, Aquatic Therapy, Dry Needling, Electrical stimulation, Spinal manipulation, Spinal mobilization, Cryotherapy, Moist heat, Traction, Ultrasound, Ionotophoresis 4mg /ml Dexamethasone, Manual therapy, and Re-evaluation   PLAN FOR NEXT SESSION: Isometrics, range of motion in hip and lumbar spine, STM and self STM strategies, strengthening of bilateral lower extremities.  Trial right knee traction/lumbar traction     2:31 PM, 03/26/22 Jerene Pitch, DPT Physical Therapy with [facility]

## 2022-03-27 ENCOUNTER — Encounter: Payer: Self-pay | Admitting: Physical Therapy

## 2022-03-27 ENCOUNTER — Ambulatory Visit: Payer: Medicare PPO | Admitting: Physical Therapy

## 2022-03-27 DIAGNOSIS — M6281 Muscle weakness (generalized): Secondary | ICD-10-CM

## 2022-03-27 DIAGNOSIS — M79604 Pain in right leg: Secondary | ICD-10-CM | POA: Diagnosis not present

## 2022-03-27 DIAGNOSIS — R262 Difficulty in walking, not elsewhere classified: Secondary | ICD-10-CM | POA: Diagnosis not present

## 2022-03-27 NOTE — Therapy (Signed)
OUTPATIENT PHYSICAL THERAPY TREATMENT NOTE   Patient Name: Danny Gross MRN: [id number] DOB:03/31/1934, 88 y.o., male Today's Date: 03/27/2022  PCP: Pricilla Holm, Loni Muse MD   REFERRING PROVIDER: Gregor Hams, MD  END OF SESSION:   PT End of Session - 03/27/22 1400     Visit Number 3    Number of Visits 12    Date for PT Re-Evaluation 05/02/22    Authorization Type humana - auth requested    PT Start Time [id number]    PT Stop Time 1435    PT Time Calculation (min) 38 min    Activity Tolerance Patient tolerated treatment well    Behavior During Therapy [facility] for tasks assessed/performed             Past Medical History:  Diagnosis Date   Cataract    Emphysema of lung [facility])    Past Surgical History:  Procedure Laterality Date   HERNIA REPAIR  2003   TONSILLECTOMY  1939   VIDEO BRONCHOSCOPY Bilateral 08/11/2013   Procedure: VIDEO BRONCHOSCOPY WITH FLUORO;  Surgeon: Tanda Rockers, MD;  Location: [facility];  Service: Cardiopulmonary;  Laterality: Bilateral;   Patient Active Problem List   Diagnosis Date Noted   Primary osteoarthritis of right hip 03/13/2022   Myalgia 11/28/2021   Thoracic aortic aneurysm ([place]) 06/07/2021   Aortic atherosclerosis ([place]) 07/19/2020   Bronchiectasis without complication ([place]) [id number]   Impaired fasting glucose 10/04/2014   Routine general medical examination at a health care facility 12/21/2013   Pulmonary infiltrates 07/13/2013     THERAPY DIAG:  Pain in right leg  Muscle weakness (generalized)  Difficulty in walking, not elsewhere classified    REFERRING DIAG: M70.61 (ICD-10-CM) - Trochanteric bursitis of right hip M25.561,G89.29 (ICD-10-CM) - Chronic pain of right knee    Rationale for Evaluation and Treatment: Rehabilitation   ONSET DATE: 8 months ago   SUBJECTIVE:    SUBJECTIVE STATEMENT: 03/27/2022 No change in symptoms. States he has been standing at the [employer] as standing is better than    09/24/21 he  got his first RSV shot and the next couple days he started getting leg cramping and pain and the pain has not let up. States that he has run for 70 years and never has had any problems. States his right knee buckles on occasion.  States he has cut down his running 3x/week and some sprinting. States he as running at a higher intensity.  Has tried prednisone         PERTINENT HISTORY: Lung emphysema PAIN:  Are you having pain? Yes: NPRS scale: 3/10 Pain location: right knee joint and right hip joint Pain description: cramping,  Aggravating factors: walking, standing, running Relieving factors: rest, positioning   PRECAUTIONS: None   WEIGHT BEARING RESTRICTIONS: No   FALLS:  Has patient fallen in last 6 months? No     OCCUPATION: retired, singing, gardening, running,    PLOF: Independent   PATIENT GOALS: to have less pain      OBJECTIVE:    DIAGNOSTIC FINDINGS: 03/08/22 Pelvis xray IMPRESSION: 1. Bilateral hip degenerative changes, greater on the right. 2. Lower lumbar spine degenerative changes.   Right knee xray FINDINGS: Mild-to-moderate medial joint space narrowing. Mild spur formation involving all 3 joint compartments. The lateral view is slightly oblique with no gross effusion seen. Atheromatous arterial calcifications.   IMPRESSION: Mild tricompartmental degenerative changes, as described above.         MUSCLE LENGTH: Hamstrings: Right 30  deg; Left 30 deg     POSTURE: rounded shoulders, forward head, and flexed trunk    PALPATION: Tenderness to palpation in the right glutes and hip external rotators, hypomobility noted in right hip joint with PA       LE Measurements       Lower Extremity Right 03/21/2022 Left 03/21/2022    A/PROM MMT A/PROM MMT  Hip Flexion WFL* 3+* WFL* 4  Hip Extension 0 3+ 0 3  Hip Abduction          Hip Adduction          Hip Internal rotation 10   30    Hip External rotation 45   40    Knee Flexion 125 3+ 125 4-   Knee Extension 0 4- 0 4  Ankle Dorsiflexion   4+   4+  Ankle Plantarflexion          Ankle Inversion          Ankle Eversion           (Blank rows = not tested) * pain-twinges              Lumbar  A/PROM:    03/21/2022      Flexion 50% limited      Extension  100% limited     R ROT       L ROT       R SB  75% limited    L SB 50% limited                          * Pain              (Blank rows = not tested)     LOWER EXTREMITY SPECIAL TESTS:  Neg: SLR, slump test Pos: Elys on left (can't break 90 degrees)     GAIT: Distance walked: 25 ft Assistive device utilized: None Level of assistance: Complete Independence Comments: slow, reduced gait speed, reduced trunk and hip ROM     TODAY'S TREATMENT:                                                                                                                              DATE: \ 03/27/2022    Therapeutic Exercise:    Aerobic: Supine: Prone: POE 3 minutes, hip exten - pain in back stopped, heel press 3 minutes 5" hlds      Seated: piriformis stretch x2 30" holds B    Standing: standing hip extension/abd x15 5-10" holds B and each Neuromuscular Re-education: Manual Therapy: Therapeutic Activity: Self Care: Trigger Point Dry Needling:  Modalities:      PATIENT EDUCATION:  Education details: on HEP,on anatomy and rationale for interventions Person educated: Patient Education method: Explanation, Demonstration, and Handouts Education comprehension: verbalized understanding     HOME EXERCISE PROGRAM: [id number]   ASSESSMENT:   CLINICAL IMPRESSION: 03/27/2022 Session focused on  progression of exercises. Prone exercises were tolerated moderately well but hip extension in prone very difficult with slight pain in back, tolerated standing hip extension better. Added standing hip exercises to HEP. Instructed patient to trial prone on elbows first thing in the morning to see if this helps with leg pain int he morning.   Overall patient is doing well and would continue to benefit from skilled PT at this time.   Eval: Patient presents to physical therapy with complaints of right lower extremity pain that started last year a couple days after RSV vaccination.  Patient presents with muscle guarding, pain, range of motion and strength deficits in right lower extremity.  Discussed findings and plan for physical therapy moving forward.  Patient with desire to continue to run and remain active which pain is currently limiting his intensity of exercise at this time.  Patient would greatly benefit from skilled physical therapy to reduce risk of further injury and return patient to optimal function.   OBJECTIVE IMPAIRMENTS: decreased activity tolerance, decreased mobility, difficulty walking, decreased ROM, decreased strength, and pain.    ACTIVITY LIMITATIONS: standing, locomotion level, and running   PARTICIPATION LIMITATIONS: community activity, yard work, and recreational activity   PERSONAL FACTORS: Age are also affecting patient's functional outcome.    REHAB POTENTIAL: Good   CLINICAL DECISION MAKING: Stable/uncomplicated   EVALUATION COMPLEXITY: Low     GOALS: Goals reviewed with patient? yes   SHORT TERM GOALS: Target date: 04/11/2022  Patient will be independent in self management strategies to improve quality of life and functional outcomes. Baseline: New Program Goal status: INITIAL   2.  Patient will report at least 50% improvement in overall symptoms and/or function to demonstrate improved functional mobility Baseline: 0% better Goal status: INITIAL   3.  Patient will demonstrate pain-free MMT in bilateral lower extremities Baseline: Painful Goal status: INITIAL       LONG TERM GOALS: Target date: 05/02/2022    Patient will report at least 75% improvement in overall symptoms and/or function to demonstrate improved functional mobility Baseline: 0% better Goal status: INITIAL   2.  Patient  will be able to walk without right lower extremity pain to improve ability to walk at home and in the community Goal status: INITIAL   3.  Patient will demonstrate at least 10 degree improvement in right hip internal rotation to demonstrate improved gross hip mobility. Baseline: See above Goal status: INITIAL     PLAN:   PT FREQUENCY: 2x/week   PT DURATION: 6 weeks   PLANNED INTERVENTIONS: Therapeutic exercises, Therapeutic activity, Neuromuscular re-education, Balance training, Gait training, Patient/Family education, Self Care, Joint mobilization, Joint manipulation, Stair training, Vestibular training, Orthotic/Fit training, DME instructions, Aquatic Therapy, Dry Needling, Electrical stimulation, Spinal manipulation, Spinal mobilization, Cryotherapy, Moist heat, Traction, Ultrasound, Ionotophoresis 4mg /ml Dexamethasone, Manual therapy, and Re-evaluation   PLAN FOR NEXT SESSION: Isometrics, range of motion in hip and lumbar spine, STM and self STM strategies, strengthening of bilateral lower extremities.  Trial right knee traction/lumbar traction     2:53 PM, 03/27/22 Jerene Pitch, DPT Physical Therapy with [facility]

## 2022-04-02 ENCOUNTER — Encounter: Payer: Self-pay | Admitting: Physical Therapy

## 2022-04-02 ENCOUNTER — Ambulatory Visit: Payer: Medicare PPO | Admitting: Physical Therapy

## 2022-04-02 DIAGNOSIS — R262 Difficulty in walking, not elsewhere classified: Secondary | ICD-10-CM

## 2022-04-02 DIAGNOSIS — M6281 Muscle weakness (generalized): Secondary | ICD-10-CM | POA: Diagnosis not present

## 2022-04-02 DIAGNOSIS — M79604 Pain in right leg: Secondary | ICD-10-CM

## 2022-04-02 NOTE — Therapy (Signed)
OUTPATIENT PHYSICAL THERAPY TREATMENT NOTE   Patient Name: Danny Gross MRN: [id number] DOB:06-26-1934, 88 y.o., male Today's Date: 04/02/2022  PCP: Pricilla Holm, Loni Muse MD   REFERRING PROVIDER: Gregor Hams, MD  END OF SESSION:   PT End of Session - 04/02/22 1306     Visit Number 4    Number of Visits 12    Date for PT Re-Evaluation 05/02/22    Authorization Type humana - auth requested    PT Start Time 1305    PT Stop Time 1343    PT Time Calculation (min) 38 min    Activity Tolerance Patient tolerated treatment well    Behavior During Therapy [facility] for tasks assessed/performed             Past Medical History:  Diagnosis Date   Cataract    Emphysema of lung [facility])    Past Surgical History:  Procedure Laterality Date   HERNIA REPAIR  2003   TONSILLECTOMY  1939   VIDEO BRONCHOSCOPY Bilateral 08/11/2013   Procedure: VIDEO BRONCHOSCOPY WITH FLUORO;  Surgeon: Tanda Rockers, MD;  Location: [facility];  Service: Cardiopulmonary;  Laterality: Bilateral;   Patient Active Problem List   Diagnosis Date Noted   Primary osteoarthritis of right hip 03/13/2022   Myalgia 11/28/2021   Thoracic aortic aneurysm ([place]) 06/07/2021   Aortic atherosclerosis ([place]) 07/19/2020   Bronchiectasis without complication ([place]) [id number]   Impaired fasting glucose 10/04/2014   Routine general medical examination at a health care facility 12/21/2013   Pulmonary infiltrates 07/13/2013     THERAPY DIAG:  Pain in right leg  Muscle weakness (generalized)  Difficulty in walking, not elsewhere classified    REFERRING DIAG: M70.61 (ICD-10-CM) - Trochanteric bursitis of right hip M25.561,G89.29 (ICD-10-CM) - Chronic pain of right knee    Rationale for Evaluation and Treatment: Rehabilitation   ONSET DATE: 8 months ago   SUBJECTIVE:    SUBJECTIVE STATEMENT: 04/02/2022 States that he did his exercises Friday and then did some jogging and had terrible pain. States that this  morning he did some ankle pumps on the right and then did his jog and felt the best he ever felt.    09/24/21 he got his first RSV shot and the next couple days he started getting leg cramping and pain and the pain has not let up. States that he has run for 70 years and never has had any problems. States his right knee buckles on occasion.  States he has cut down his running 3x/week and some sprinting. States he as running at a higher intensity.  Has tried prednisone         PERTINENT HISTORY: Lung emphysema PAIN:  Are you having pain? Yes: NPRS scale: 3/10 Pain location: right knee joint and right hip joint Pain description: cramping,  Aggravating factors: walking, standing, running Relieving factors: rest, positioning   PRECAUTIONS: None   WEIGHT BEARING RESTRICTIONS: No   FALLS:  Has patient fallen in last 6 months? No     OCCUPATION: retired, singing, gardening, running,    PLOF: Independent   PATIENT GOALS: to have less pain      OBJECTIVE:    DIAGNOSTIC FINDINGS: 03/08/22 Pelvis xray IMPRESSION: 1. Bilateral hip degenerative changes, greater on the right. 2. Lower lumbar spine degenerative changes.   Right knee xray FINDINGS: Mild-to-moderate medial joint space narrowing. Mild spur formation involving all 3 joint compartments. The lateral view is slightly oblique with no gross effusion seen. Atheromatous arterial calcifications.  IMPRESSION: Mild tricompartmental degenerative changes, as described above.         MUSCLE LENGTH: Hamstrings: Right 30 deg; Left 30 deg     POSTURE: rounded shoulders, forward head, and flexed trunk    PALPATION: Tenderness to palpation in the right glutes and hip external rotators, hypomobility noted in right hip joint with PA       LE Measurements       Lower Extremity Right 03/21/2022 Left 03/21/2022    A/PROM MMT A/PROM MMT  Hip Flexion WFL* 3+* WFL* 4  Hip Extension 0 3+ 0 3  Hip Abduction          Hip  Adduction          Hip Internal rotation 10   30    Hip External rotation 45   40    Knee Flexion 125 3+ 125 4-  Knee Extension 0 4- 0 4  Ankle Dorsiflexion   4+   4+  Ankle Plantarflexion          Ankle Inversion          Ankle Eversion           (Blank rows = not tested) * pain-twinges              Lumbar  A/PROM:    03/21/2022      Flexion 50% limited      Extension  100% limited     R ROT       L ROT       R SB  75% limited    L SB 50% limited                          * Pain              (Blank rows = not tested)     LOWER EXTREMITY SPECIAL TESTS:  Neg: SLR, slump test Pos: Elys on left (can't break 90 degrees)     GAIT: Distance walked: 25 ft Assistive device utilized: None Level of assistance: Complete Independence Comments: slow, reduced gait speed, reduced trunk and hip ROM     TODAY'S TREATMENT:                                                                                                                              DATE: \ 04/02/2022    Therapeutic Exercise:    Aerobic: Supine: Prone:        Seated: ankle pumps x20 R, sciatic stretch 3 minutes R, piriformis stretch x3 30" holds, hamstring stretch x3 30" holds Neuromuscular Re-education: Manual Therapy: Therapeutic Activity: Self Care: Trigger Point Dry Needling:  Modalities:      PATIENT EDUCATION:  Education details: on HEP,on anatomy and rationale for interventions, review of all exercises and rationale, on trying just ankle pumps prior to running and the HEP from PT prior to running  Person educated:  Patient Education method: Explanation, Demonstration, and Handouts Education comprehension: verbalized understanding     HOME EXERCISE PROGRAM: [id number]   ASSESSMENT:   CLINICAL IMPRESSION: 04/02/2022 Session focused on review of all exercises as well as possibility of incidence with recent increase in pain after performing exercises.  Discussed trialing near stretches instead of  strengthening exercises prior to jogging.  Answered all questions on this date.  No pain or soreness noted, will continue with current plan of care as tolerated. Eval: Patient presents to physical therapy with complaints of right lower extremity pain that started last year a couple days after RSV vaccination.  Patient presents with muscle guarding, pain, range of motion and strength deficits in right lower extremity.  Discussed findings and plan for physical therapy moving forward.  Patient with desire to continue to run and remain active which pain is currently limiting his intensity of exercise at this time.  Patient would greatly benefit from skilled physical therapy to reduce risk of further injury and return patient to optimal function.   OBJECTIVE IMPAIRMENTS: decreased activity tolerance, decreased mobility, difficulty walking, decreased ROM, decreased strength, and pain.    ACTIVITY LIMITATIONS: standing, locomotion level, and running   PARTICIPATION LIMITATIONS: community activity, yard work, and recreational activity   PERSONAL FACTORS: Age are also affecting patient's functional outcome.    REHAB POTENTIAL: Good   CLINICAL DECISION MAKING: Stable/uncomplicated   EVALUATION COMPLEXITY: Low     GOALS: Goals reviewed with patient? yes   SHORT TERM GOALS: Target date: 04/11/2022  Patient will be independent in self management strategies to improve quality of life and functional outcomes. Baseline: New Program Goal status: INITIAL   2.  Patient will report at least 50% improvement in overall symptoms and/or function to demonstrate improved functional mobility Baseline: 0% better Goal status: INITIAL   3.  Patient will demonstrate pain-free MMT in bilateral lower extremities Baseline: Painful Goal status: INITIAL       LONG TERM GOALS: Target date: 05/02/2022    Patient will report at least 75% improvement in overall symptoms and/or function to demonstrate improved functional  mobility Baseline: 0% better Goal status: INITIAL   2.  Patient will be able to walk without right lower extremity pain to improve ability to walk at home and in the community Goal status: INITIAL   3.  Patient will demonstrate at least 10 degree improvement in right hip internal rotation to demonstrate improved gross hip mobility. Baseline: See above Goal status: INITIAL     PLAN:   PT FREQUENCY: 2x/week   PT DURATION: 6 weeks   PLANNED INTERVENTIONS: Therapeutic exercises, Therapeutic activity, Neuromuscular re-education, Balance training, Gait training, Patient/Family education, Self Care, Joint mobilization, Joint manipulation, Stair training, Vestibular training, Orthotic/Fit training, DME instructions, Aquatic Therapy, Dry Needling, Electrical stimulation, Spinal manipulation, Spinal mobilization, Cryotherapy, Moist heat, Traction, Ultrasound, Ionotophoresis 4mg /ml Dexamethasone, Manual therapy, and Re-evaluation   PLAN FOR NEXT SESSION: Isometrics, range of motion in hip and lumbar spine, STM and self STM strategies, strengthening of bilateral lower extremities.  Trial right knee traction/lumbar traction     1:50 PM, 04/02/22 Jerene Pitch, DPT Physical Therapy with [facility]

## 2022-04-05 ENCOUNTER — Ambulatory Visit: Payer: Medicare PPO | Admitting: Physical Therapy

## 2022-04-05 ENCOUNTER — Encounter: Payer: Self-pay | Admitting: Physical Therapy

## 2022-04-05 DIAGNOSIS — M6281 Muscle weakness (generalized): Secondary | ICD-10-CM | POA: Diagnosis not present

## 2022-04-05 DIAGNOSIS — M79604 Pain in right leg: Secondary | ICD-10-CM

## 2022-04-05 DIAGNOSIS — R262 Difficulty in walking, not elsewhere classified: Secondary | ICD-10-CM | POA: Diagnosis not present

## 2022-04-05 NOTE — Therapy (Signed)
OUTPATIENT PHYSICAL THERAPY TREATMENT NOTE   Patient Name: Danny Gross MRN: [id number] DOB:[birth year], 88 y.o., male Today's Date: 04/05/2022  PCP: Pricilla Holm, Loni Muse MD   REFERRING PROVIDER: Gregor Hams, MD  END OF SESSION:   PT End of Session - 04/05/22 1307     Visit Number 5    Number of Visits 12    Date for PT Re-Evaluation 05/02/22    Authorization Type humana -12 visits from 03/21/2022 - 05/02/2022    Authorization - Visit Number 5    Authorization - Number of Visits 12    PT Start Time [id number]    PT Stop Time 1346    PT Time Calculation (min) 38 min    Activity Tolerance Patient tolerated treatment well    Behavior During Therapy [facility] for tasks assessed/performed             Past Medical History:  Diagnosis Date   Cataract    Emphysema of lung ([facility])    Past Surgical History:  Procedure Laterality Date   HERNIA REPAIR  2003   TONSILLECTOMY  1939   VIDEO BRONCHOSCOPY Bilateral 08/11/2013   Procedure: VIDEO BRONCHOSCOPY WITH FLUORO;  Surgeon: Tanda Rockers, MD;  Location: [facility];  Service: Cardiopulmonary;  Laterality: Bilateral;   Patient Active Problem List   Diagnosis Date Noted   Primary osteoarthritis of right hip 03/13/2022   Myalgia 11/28/2021   Thoracic aortic aneurysm ([place]) 06/07/2021   Aortic atherosclerosis ([place]) 07/19/2020   Bronchiectasis without complication ([place]) [id number]   Impaired fasting glucose 10/04/2014   Routine general medical examination at a health care facility 12/21/2013   Pulmonary infiltrates 07/13/2013     THERAPY DIAG:  Muscle weakness (generalized)  Pain in right leg  Difficulty in walking, not elsewhere classified    REFERRING DIAG: M70.61 (ICD-10-CM) - Trochanteric bursitis of right hip M25.561,G89.29 (ICD-10-CM) - Chronic pain of right knee    Rationale for Evaluation and Treatment: Rehabilitation   ONSET DATE: 8 months ago   SUBJECTIVE:    SUBJECTIVE STATEMENT: 04/05/2022 States that he  is concentrating on his ankle pump nerve stretch and that seems to help his leg. States that he was moving a heavy object and the next morning he had increased pain in the small of the lower back. States he moved the object on Tuesday. States medication helps.    09/24/21 he got his first RSV shot and the next couple days he started getting leg cramping and pain and the pain has not let up. States that he has run for 70 years and never has had any problems. States his right knee buckles on occasion.  States he has cut down his running 3x/week and some sprinting. States he as running at a higher intensity.  Has tried prednisone         PERTINENT HISTORY: Lung emphysema PAIN:  Are you having pain? Yes: NPRS scale: 9/10 Pain location: low back Pain description: aching, intense grabbing Aggravating factors: walking, standing, running Relieving factors: rest, positioning   PRECAUTIONS: None   WEIGHT BEARING RESTRICTIONS: No   FALLS:  Has patient fallen in last 6 months? No     OCCUPATION: retired, singing, gardening, running,    PLOF: Independent   PATIENT GOALS: to have less pain      OBJECTIVE:    DIAGNOSTIC FINDINGS: 03/08/22 Pelvis xray IMPRESSION: 1. Bilateral hip degenerative changes, greater on the right. 2. Lower lumbar spine degenerative changes.   Right knee xray FINDINGS: Mild-to-moderate  medial joint space narrowing. Mild spur formation involving all 3 joint compartments. The lateral view is slightly oblique with no gross effusion seen. Atheromatous arterial calcifications.   IMPRESSION: Mild tricompartmental degenerative changes, as described above.         MUSCLE LENGTH: Hamstrings: Right 30 deg; Left 30 deg     POSTURE: rounded shoulders, forward head, and flexed trunk    PALPATION: Tenderness to palpation in the right glutes and hip external rotators, hypomobility noted in right hip joint with PA       LE Measurements       Lower Extremity  Right 03/21/2022 Left 03/21/2022    A/PROM MMT A/PROM MMT  Hip Flexion WFL* 3+* WFL* 4  Hip Extension 0 3+ 0 3  Hip Abduction          Hip Adduction          Hip Internal rotation 10   30    Hip External rotation 45   40    Knee Flexion 125 3+ 125 4-  Knee Extension 0 4- 0 4  Ankle Dorsiflexion   4+   4+  Ankle Plantarflexion          Ankle Inversion          Ankle Eversion           (Blank rows = not tested) * pain-twinges              Lumbar  A/PROM:    03/21/2022      Flexion 50% limited      Extension  100% limited     R ROT       L ROT       R SB  75% limited    L SB 50% limited                          * Pain              (Blank rows = not tested)     LOWER EXTREMITY SPECIAL TESTS:  Neg: SLR, slump test Pos: Elys on left (can't break 90 degrees)     GAIT: Distance walked: 25 ft Assistive device utilized: None Level of assistance: Complete Independence Comments: slow, reduced gait speed, reduced trunk and hip ROM     TODAY'S TREATMENT:                                                                                                                              DATE: \ 04/05/2022    Therapeutic Exercise:    Aerobic: Supine: LTR 2 minutes - pain in R leg Prone:  lying 3 minutes, static on elbows x4 30" holds, press ups 3x5       Seated: trunk ROT 5 minutes, pelvic tilts 6 minutes Neuromuscular Re-education: Manual Therapy: Therapeutic Activity: Self Care: Trigger Point Dry Needling:  Modalities:  PATIENT EDUCATION:  Education details: on HEP, on typical progression/healing of acute low back pain Person educated: Patient Education method: Explanation, Demonstration, and Handouts Education comprehension: verbalized understanding     HOME EXERCISE PROGRAM: [id number]   ASSESSMENT:   CLINICAL IMPRESSION: 04/05/2022 Session focused on addressing current acute low back pain after lifting heavy object a couple days ago.  Educated patient on typical  healing as well as avoiding lifting at this time.  Tolerated exercises well reporting significantly reduced pain at end of session.  Provided handouts for exercises and instructed patient to perform new exercises daily until low back pain resolves.  Will continue with current plan of care as tolerated.  Eval: Patient presents to physical therapy with complaints of right lower extremity pain that started last year a couple days after RSV vaccination.  Patient presents with muscle guarding, pain, range of motion and strength deficits in right lower extremity.  Discussed findings and plan for physical therapy moving forward.  Patient with desire to continue to run and remain active which pain is currently limiting his intensity of exercise at this time.  Patient would greatly benefit from skilled physical therapy to reduce risk of further injury and return patient to optimal function.   OBJECTIVE IMPAIRMENTS: decreased activity tolerance, decreased mobility, difficulty walking, decreased ROM, decreased strength, and pain.    ACTIVITY LIMITATIONS: standing, locomotion level, and running   PARTICIPATION LIMITATIONS: community activity, yard work, and recreational activity   PERSONAL FACTORS: Age are also affecting patient's functional outcome.    REHAB POTENTIAL: Good   CLINICAL DECISION MAKING: Stable/uncomplicated   EVALUATION COMPLEXITY: Low     GOALS: Goals reviewed with patient? yes   SHORT TERM GOALS: Target date: 04/11/2022  Patient will be independent in self management strategies to improve quality of life and functional outcomes. Baseline: New Program Goal status: INITIAL   2.  Patient will report at least 50% improvement in overall symptoms and/or function to demonstrate improved functional mobility Baseline: 0% better Goal status: INITIAL   3.  Patient will demonstrate pain-free MMT in bilateral lower extremities Baseline: Painful Goal status: INITIAL       LONG TERM GOALS:  Target date: 05/02/2022    Patient will report at least 75% improvement in overall symptoms and/or function to demonstrate improved functional mobility Baseline: 0% better Goal status: INITIAL   2.  Patient will be able to walk without right lower extremity pain to improve ability to walk at home and in the community Goal status: INITIAL   3.  Patient will demonstrate at least 10 degree improvement in right hip internal rotation to demonstrate improved gross hip mobility. Baseline: See above Goal status: INITIAL     PLAN:   PT FREQUENCY: 2x/week   PT DURATION: 6 weeks   PLANNED INTERVENTIONS: Therapeutic exercises, Therapeutic activity, Neuromuscular re-education, Balance training, Gait training, Patient/Family education, Self Care, Joint mobilization, Joint manipulation, Stair training, Vestibular training, Orthotic/Fit training, DME instructions, Aquatic Therapy, Dry Needling, Electrical stimulation, Spinal manipulation, Spinal mobilization, Cryotherapy, Moist heat, Traction, Ultrasound, Ionotophoresis 4mg /ml Dexamethasone, Manual therapy, and Re-evaluation   PLAN FOR NEXT SESSION: Isometrics, range of motion in hip and lumbar spine, STM and self STM strategies, strengthening of bilateral lower extremities.  Trial right knee traction/lumbar traction     2:02 PM, 04/05/22 Jerene Pitch, DPT Physical Therapy with [facility]

## 2022-04-09 ENCOUNTER — Encounter: Payer: Self-pay | Admitting: Physical Therapy

## 2022-04-09 ENCOUNTER — Ambulatory Visit: Payer: Medicare PPO | Admitting: Physical Therapy

## 2022-04-09 DIAGNOSIS — M6281 Muscle weakness (generalized): Secondary | ICD-10-CM | POA: Diagnosis not present

## 2022-04-09 DIAGNOSIS — R262 Difficulty in walking, not elsewhere classified: Secondary | ICD-10-CM | POA: Diagnosis not present

## 2022-04-09 DIAGNOSIS — M79604 Pain in right leg: Secondary | ICD-10-CM

## 2022-04-09 NOTE — Therapy (Signed)
OUTPATIENT PHYSICAL THERAPY TREATMENT NOTE   Patient Name: Danny Gross MRN: [id number] DOB:[birth year], 88 y.o., male Today's Date: 04/09/2022  PCP: Pricilla Holm, Loni Muse MD   REFERRING PROVIDER: Gregor Hams, MD  END OF SESSION:   PT End of Session - 04/09/22 1311     Visit Number 6    Number of Visits 12    Date for PT Re-Evaluation 05/02/22    Authorization Type humana -12 visits from 03/21/2022 - 05/02/2022    Authorization - Visit Number 6    Authorization - Number of Visits 12    PT Start Time [id number]    PT Stop Time 1348    PT Time Calculation (min) 40 min    Activity Tolerance Patient tolerated treatment well    Behavior During Therapy [facility] for tasks assessed/performed             Past Medical History:  Diagnosis Date   Cataract    Emphysema of lung    Past Surgical History:  Procedure Laterality Date   HERNIA REPAIR  2003   TONSILLECTOMY  1939   VIDEO BRONCHOSCOPY Bilateral 08/11/2013   Procedure: VIDEO BRONCHOSCOPY WITH FLUORO;  Surgeon: Tanda Rockers, MD;  Location: [facility];  Service: Cardiopulmonary;  Laterality: Bilateral;   Patient Active Problem List   Diagnosis Date Noted   Primary osteoarthritis of right hip 03/13/2022   Myalgia 11/28/2021   Thoracic aortic aneurysm 06/07/2021   Aortic atherosclerosis 07/19/2020   Bronchiectasis without complication [id number]   Impaired fasting glucose 10/04/2014   Routine general medical examination at a health care facility 12/21/2013   Pulmonary infiltrates 07/13/2013     THERAPY DIAG:  Muscle weakness (generalized)  Pain in right leg  Difficulty in walking, not elsewhere classified    REFERRING DIAG: M70.61 (ICD-10-CM) - Trochanteric bursitis of right hip M25.561,G89.29 (ICD-10-CM) - Chronic pain of right knee    Rationale for Evaluation and Treatment: Rehabilitation   ONSET DATE: 8 months ago   SUBJECTIVE:    SUBJECTIVE STATEMENT: 04/09/2022 States that his leg felt the best it has  felt in months on Friday during dance. States his back is much better. Yesterday was a bit off as he had a busy day with singing on easter Sunday (lots of sitting and standing).   09/24/21 he got his first RSV shot and the next couple days he started getting leg cramping and pain and the pain has not let up. States that he has run for 70 years and never has had any problems. States his right knee buckles on occasion.  States he has cut down his running 3x/week and some sprinting. States he as running at a higher intensity.  Has tried prednisone         PERTINENT HISTORY: Lung emphysema PAIN:  Are you having pain? no: NPRS scale: 0/10 Pain location: low back Pain description: aching, intense grabbing Aggravating factors: walking, standing, running Relieving factors: rest, positioning   PRECAUTIONS: None   WEIGHT BEARING RESTRICTIONS: No   FALLS:  Has patient fallen in last 6 months? No     OCCUPATION: retired, singing, gardening, running,    PLOF: Independent   PATIENT GOALS: to have less pain      OBJECTIVE:    DIAGNOSTIC FINDINGS: 03/08/22 Pelvis xray IMPRESSION: 1. Bilateral hip degenerative changes, greater on the right. 2. Lower lumbar spine degenerative changes.   Right knee xray FINDINGS: Mild-to-moderate medial joint space narrowing. Mild spur formation involving all 3 joint compartments. The  lateral view is slightly oblique with no gross effusion seen. Atheromatous arterial calcifications.   IMPRESSION: Mild tricompartmental degenerative changes, as described above.           LE Measurements       Lower Extremity Right 04/09/22 Left 04/09/2022    A/PROM MMT A/PROM MMT  Hip Flexion WFL* 4 WFL* 4  Hip Extension 0 3+ 0 3*  Hip Abduction          Hip Adduction          Hip Internal rotation 10   30    Hip External rotation 45   55    Knee Flexion 125 4- 125 4-  Knee Extension 0 4 0 4  Ankle Dorsiflexion   4+   4+  Ankle Plantarflexion           Ankle Inversion          Ankle Eversion           (Blank rows = not tested) * pain-twinges              Lumbar  A/PROM:    4/1//2024      Flexion 50% limited      Extension  100% limited     R ROT       L ROT       R SB  75% limited    L SB 50% limited                          * Pain              (Blank rows = not tested)          TODAY'S TREATMENT:                                                                                                                              DATE:  04/09/2022    Therapeutic Exercise:  HEP - review of entire HEP  Neuromuscular Re-education: Manual Therapy: Therapeutic Activity: Self Care: Trigger Point Dry Needling:  Modalities:      PATIENT EDUCATION:  Education details: on HEP, on on progress made, on goals and current physical presentation Person educated: Patient Education method: Explanation, Demonstration, and Handouts Education comprehension: verbalized understanding     HOME EXERCISE PROGRAM: [id number]   ASSESSMENT:   CLINICAL IMPRESSION: 04/09/2022 Session focused on updating strength and range of motion and reviewing goals.  Overall patient is progressing very well and has met all short-term goals and 2 out of 3 long-term goals.  Reviewed home exercise program extensively and answered all questions.  Also educated patient in general maintenance of skin as right lower leg very swollen and skin very dry on bilateral lower extremities.  Discussed daily moisturizer as well as ankle pumps and movement in lower extremities to help with swelling.  Patient would continue to benefit from skilled physical therapy  to improve overall health and function. Eval: Patient presents to physical therapy with complaints of right lower extremity pain that started last year a couple days after RSV vaccination.  Patient presents with muscle guarding, pain, range of motion and strength deficits in right lower extremity.  Discussed findings and plan for  physical therapy moving forward.  Patient with desire to continue to run and remain active which pain is currently limiting his intensity of exercise at this time.  Patient would greatly benefit from skilled physical therapy to reduce risk of further injury and return patient to optimal function.   OBJECTIVE IMPAIRMENTS: decreased activity tolerance, decreased mobility, difficulty walking, decreased ROM, decreased strength, and pain.    ACTIVITY LIMITATIONS: standing, locomotion level, and running   PARTICIPATION LIMITATIONS: community activity, yard work, and recreational activity   PERSONAL FACTORS: Age are also affecting patient's functional outcome.    REHAB POTENTIAL: Good   CLINICAL DECISION MAKING: Stable/uncomplicated   EVALUATION COMPLEXITY: Low     GOALS: Goals reviewed with patient? yes   SHORT TERM GOALS: Target date: 04/11/2022  Patient will be independent in self management strategies to improve quality of life and functional outcomes. Baseline: New Program Goal status: MET   2.  Patient will report at least 50% improvement in overall symptoms and/or function to demonstrate improved functional mobility Baseline: 0% better Goal status: MET - 80% better   3.  Patient will demonstrate pain-free MMT in bilateral lower extremities Baseline: Painful Goal status: MET       LONG TERM GOALS: Target date: 05/02/2022    Patient will report at least 75% improvement in overall symptoms and/or function to demonstrate improved functional mobility Baseline: 0%  Goal status: Met   2.  Patient will be able to walk without right lower extremity pain to improve ability to walk at home and in the community Goal status: MET   3.  Patient will demonstrate at least 10 degree improvement in right hip internal rotation to demonstrate improved gross hip mobility. Baseline: See above Goal status: PROGRESSING     PLAN:   PT FREQUENCY: 2x/week   PT DURATION: 6 weeks   PLANNED  INTERVENTIONS: Therapeutic exercises, Therapeutic activity, Neuromuscular re-education, Balance training, Gait training, Patient/Family education, Self Care, Joint mobilization, Joint manipulation, Stair training, Vestibular training, Orthotic/Fit training, DME instructions, Aquatic Therapy, Dry Needling, Electrical stimulation, Spinal manipulation, Spinal mobilization, Cryotherapy, Moist heat, Traction, Ultrasound, Ionotophoresis 4mg /ml Dexamethasone, Manual therapy, and Re-evaluation   PLAN FOR NEXT SESSION: Isometrics, range of motion in hip and lumbar spine, STM and self STM strategies, strengthening of bilateral lower extremities.  Trial right knee traction/lumbar traction     2:02 PM, 04/09/22 Jerene Pitch, DPT Physical Therapy with [facility]

## 2022-04-11 ENCOUNTER — Encounter: Payer: Medicare PPO | Admitting: Physical Therapy

## 2022-04-16 ENCOUNTER — Emergency Department (HOSPITAL_BASED_OUTPATIENT_CLINIC_OR_DEPARTMENT_OTHER): Payer: Medicare PPO | Admitting: Radiology

## 2022-04-16 ENCOUNTER — Encounter: Payer: Medicare PPO | Admitting: Physical Therapy

## 2022-04-16 ENCOUNTER — Emergency Department (HOSPITAL_BASED_OUTPATIENT_CLINIC_OR_DEPARTMENT_OTHER)
Admission: EM | Admit: 2022-04-16 | Discharge: 2022-04-16 | Disposition: A | Discharge status: Home / Self Care | Payer: Medicare PPO | Attending: Emergency Medicine | Admitting: Emergency Medicine

## 2022-04-16 ENCOUNTER — Other Ambulatory Visit: Payer: Self-pay

## 2022-04-16 DIAGNOSIS — Z79899 Other long term (current) drug therapy: Secondary | ICD-10-CM | POA: Diagnosis not present

## 2022-04-16 DIAGNOSIS — M545 Low back pain, unspecified: Secondary | ICD-10-CM | POA: Insufficient documentation

## 2022-04-16 DIAGNOSIS — M5459 Other low back pain: Secondary | ICD-10-CM | POA: Diagnosis not present

## 2022-04-16 DIAGNOSIS — I10 Essential (primary) hypertension: Secondary | ICD-10-CM | POA: Diagnosis not present

## 2022-04-16 MED ORDER — ACETAMINOPHEN 500 MG PO TABS
1000.0000 mg | ORAL_TABLET | Freq: Once | ORAL | Status: AC
  Administered 2022-04-16: 1000 mg via ORAL
  Filled 2022-04-16: qty 2

## 2022-04-16 MED ORDER — LIDOCAINE 5 % EX PTCH
1.0000 | MEDICATED_PATCH | Freq: Once | CUTANEOUS | Status: DC
  Administered 2022-04-16: 1 via TRANSDERMAL
  Filled 2022-04-16: qty 1

## 2022-04-16 MED ORDER — LIDOCAINE 5 % EX PTCH: 1.0000 | MEDICATED_PATCH | CUTANEOUS | 0 refills | Status: DC

## 2022-04-16 NOTE — Discharge Instructions (Signed)
You were seen in the emergency department for your back pain.  You did have some arthritis in your back of your x-ray and may have exacerbated this or pulled a muscle with your recent lifting.  You can continue to take Tylenol and Motrin as needed for pain and both can be taken up to every 6 hours.  You can use lidocaine patches, ice or heat.  You should avoid lifting greater than 10 pounds for at least the next week or until cleared by your primary doctor to allow your back to heal.  You should follow-up with your primary doctor to have your symptoms rechecked and you can follow-up with neurosurgery as needed if you are having continued pain.  You should return to the emergency department if you are having numbness or weakness in your legs, difficulty walking, unable to urinate or if you have any other new or concerning symptoms.

## 2022-04-16 NOTE — ED Provider Notes (Signed)
[name] EMERGENCY DEPARTMENT AT [facility] Provider Note   CSN: [id number] Arrival date & time: 04/16/22  0957     History  Chief Complaint  Patient presents with   Back Pain    Danny Gross is a 88 y.o. male.  Patient is an 88 year old male with a past medical history of hypertension presenting to the emergency department with left-sided back pain.  The patient states that he has been moving the last 2 days and has had increasing pain in the left side of his low back.  He states that his pain is worse when he is laying flat on his back at night.  He denies any trauma or falls.  He states that he has been able to ambulate normally.  He denies any numbness or weakness or radiation of pain down his legs.  He denies any saddle anesthesia, loss of bowel or bladder function, dysuria or hematuria.  The history is provided by the patient and the spouse.  Back Pain      Home Medications Prior to Admission medications   Medication Sig Start Date End Date Taking? Authorizing Provider  lidocaine (LIDODERM) 5 % Place 1 patch onto the skin daily. Remove & Discard patch within 12 hours or as directed by MD 04/16/22  Yes Theresia Lo, Benetta Spar K, DO  amLODipine (NORVASC) 5 MG tablet TAKE 1 TABLET (5 MG TOTAL) BY MOUTH DAILY. 12/07/21   Myrlene Broker, MD  brimonidine-timolol (COMBIGAN) 0.2-0.5 % ophthalmic solution Place 1 drop into both eyes every 12 (twelve) hours.    [provider]  co-enzyme Q-10 30 MG capsule Take 30 mg by mouth daily.    [provider]  dorzolamide (TRUSOPT) 2 % ophthalmic solution Place 1 drop into both eyes 2 (two) times daily.    [provider]  guaiFENesin (MUCINEX) 600 MG 12 hr tablet Take 600 mg by mouth 2 (two) times daily as needed.    [provider]  ROCKLATAN 0.02-0.005 % SOLN Apply 1 drop to eye at bedtime. 12/05/20   [provider]      Allergies    Patient has no known allergies.    Review  of Systems   Review of Systems  Musculoskeletal:  Positive for back pain.    Physical Exam Updated Vital Signs BP (!) 173/81   Pulse (!) 51   Temp 98.4 F (36.9 C)   Resp 18   Ht 5\' 9"  (1.753 m)   Wt 64.4 kg   SpO2 99%   BMI 20.97 kg/m  Physical Exam Vitals and nursing note reviewed.  Constitutional:      General: He is not in acute distress.    Appearance: Normal appearance.  HENT:     Head: Normocephalic and atraumatic.     Nose: Nose normal.  Neck:     Comments: No midline neck tenderness Cardiovascular:     Rate and Rhythm: Normal rate and regular rhythm.     Heart sounds: Normal heart sounds.  Pulmonary:     Effort: Pulmonary effort is normal.  Abdominal:     General: Abdomen is flat.     Palpations: Abdomen is soft.     Tenderness: There is no abdominal tenderness. There is no right CVA tenderness or left CVA tenderness.  Musculoskeletal:        General: Normal range of motion.     Cervical back: Normal range of motion and neck supple.     Comments: No midline back tenderness  Point tenderness to L sacroilliac joint  Skin:    General: Skin is warm and dry.  Neurological:     General: No focal deficit present.     Mental Status: He is alert and oriented to person, place, and time.     Sensory: No sensory deficit.     Motor: No weakness (5/5 strength in bilateral LE).  Psychiatric:        Mood and Affect: Mood normal.        Behavior: Behavior normal.     ED Results / Procedures / Treatments   Labs (all labs ordered are listed, but only abnormal results are displayed) Labs Reviewed - No data to display  EKG None  Radiology DG Lumbar Spine Complete  Result Date: 04/16/2022 CLINICAL DATA:  Acute left-sided lower back pain EXAM: LUMBAR SPINE - COMPLETE 5 VIEW COMPARISON:  None Available. FINDINGS: Vertebral body heights are preserved. Intervertebral disc space narrowing L4-5 and L5-S1 with grade 1 anterolisthesis at L4-5 and retrolisthesis at L5-S1 with  suspected pars interarticularis defects at L5. IMPRESSION: 1. No acute compression fracture. 2. Grade 1 anterolisthesis at L4-5 and retrolisthesis at L5-S1 with suspected pars interarticularis defects at L5. Electronically Signed   By: Agustin CreeLimin  Xu M.D.   On: 04/16/2022 12:15    Procedures Procedures    Medications Ordered in ED Medications  lidocaine (LIDODERM) 5 % 1-3 patch (1 patch Transdermal Patch Applied 04/16/22 1142)  acetaminophen (TYLENOL) tablet 1,000 mg (1,000 mg Oral Given 04/16/22 1141)    ED Course/ Medical Decision Making/ A&P Clinical Course as of 04/16/22 1321  Mon Apr 16, 2022  1236 Degenerative changes on lumbar XR without acute traumatic injury. He will be recommended PCP follow up and neurosurgery as needed. [VK]    Clinical Course User Index [VK] Rexford MausKingsley, Montia Haslip K, DO                             Medical Decision Making This patient presents to the ED with chief complaint(s) of back pain with pertinent past medical history of HTN which further complicates the presenting complaint. The complaint involves an extensive differential diagnosis and also carries with it a high risk of complications and morbidity.    The differential diagnosis includes muscle strain or spasm, arthritis, fracture or dislocation unlikely as no significant trauma and no bony tenderness on exam, patient has no radiation of pain making sciatica unlikely, he has no focal neurologic deficits, saddle anesthesia, loss of bowel or bladder function making cauda equina or epidural abscess unlikely  Additional history obtained: Additional history obtained from spouse Records reviewed N/A  ED Course and Reassessment: On patient's arrival he is well-appearing in no acute distress.  He has no focal neurologic deficits, no significant trauma making fracture, epidural abscess or cauda equina unlikely.  Likely has muscle strain or spasm over patient and wife are requesting x-rays and he will of the lumbar  x-ray performed to evaluate for bony abnormality.  He was given Tylenol and lidocaine patch for pain and will be closely reassessed.  Independent labs interpretation:  N/A  Independent visualization of imaging: - I independently visualized the following imaging with scope of interpretation limited to determining acute life threatening conditions related to emergency care: lumbar XR, which revealed no acute traumatic injury  Consultation: - Consulted or discussed management/test interpretation w/ external professional: N/A  Consideration for admission or further workup: Patient has no emergent conditions requiring admission or  further work-up at this time and is stable for discharge home with primary care follow-up  Social Determinants of health: N/A    Amount and/or Complexity of Data Reviewed Radiology: ordered.  Risk OTC drugs. Prescription drug management.          Final Clinical Impression(s) / ED Diagnoses Final diagnoses:  Acute left-sided low back pain without sciatica    Rx / DC Orders ED Discharge Orders          Ordered    lidocaine (LIDODERM) 5 %  Every 24 hours        04/16/22 1319              Rexford Maus, DO 04/16/22 1321

## 2022-04-16 NOTE — ED Triage Notes (Signed)
Pt arrived POV, ambulatory, NAD, caox4 c/o bilateral lower back pain that has been ongoing since last night. Pt states they have been moving so he has been doing more lifting and exertion than normal. Pt denies recent falls. Pt denies urinary s/s.

## 2022-04-17 DIAGNOSIS — Z961 Presence of intraocular lens: Secondary | ICD-10-CM | POA: Diagnosis not present

## 2022-04-17 DIAGNOSIS — H401133 Primary open-angle glaucoma, bilateral, severe stage: Secondary | ICD-10-CM | POA: Diagnosis not present

## 2022-04-18 ENCOUNTER — Encounter: Payer: Self-pay | Admitting: Physical Therapy

## 2022-04-18 ENCOUNTER — Ambulatory Visit: Payer: Medicare PPO | Admitting: Physical Therapy

## 2022-04-18 DIAGNOSIS — M6281 Muscle weakness (generalized): Secondary | ICD-10-CM

## 2022-04-18 DIAGNOSIS — R262 Difficulty in walking, not elsewhere classified: Secondary | ICD-10-CM | POA: Diagnosis not present

## 2022-04-18 DIAGNOSIS — M79604 Pain in right leg: Secondary | ICD-10-CM

## 2022-04-18 NOTE — Therapy (Signed)
OUTPATIENT PHYSICAL THERAPY TREATMENT NOTE   Patient Name: Danny Gross MRN: [id number] DOB:Jun 06, 1934, 88 y.o., male Today's Date: 04/18/2022  PCP: Hillard Danker, Mervyn Skeeters MD   REFERRING PROVIDER: Rodolph Bong, MD  END OF SESSION:   PT End of Session - 04/18/22 1259     Visit Number 7    Number of Visits 12    Date for PT Re-Evaluation 05/02/22    Authorization Type humana -12 visits from 03/21/2022 - 05/02/2022    Authorization - Visit Number 7    Authorization - Number of Visits 12    PT Start Time 1303    PT Stop Time 1341    PT Time Calculation (min) 38 min    Activity Tolerance Patient tolerated treatment well    Behavior During Therapy [facility] for tasks assessed/performed             Past Medical History:  Diagnosis Date   Cataract    Emphysema of lung    Past Surgical History:  Procedure Laterality Date   HERNIA REPAIR  2003   TONSILLECTOMY  1939   VIDEO BRONCHOSCOPY Bilateral 08/11/2013   Procedure: VIDEO BRONCHOSCOPY WITH FLUORO;  Surgeon: Nyoka Cowden, MD;  Location: [facility];  Service: Cardiopulmonary;  Laterality: Bilateral;   Patient Active Problem List   Diagnosis Date Noted   Primary osteoarthritis of right hip 03/13/2022   Myalgia 11/28/2021   Thoracic aortic aneurysm 06/07/2021   Aortic atherosclerosis 07/19/2020   Bronchiectasis without complication 04/22/2018   Impaired fasting glucose 10/04/2014   Routine general medical examination at a health care facility 12/21/2013   Pulmonary infiltrates 07/13/2013     THERAPY DIAG:  Muscle weakness (generalized)  Pain in right leg  Difficulty in walking, not elsewhere classified    REFERRING DIAG: M70.61 (ICD-10-CM) - Trochanteric bursitis of right hip M25.561,G89.29 (ICD-10-CM) - Chronic pain of right knee    Rationale for Evaluation and Treatment: Rehabilitation   ONSET DATE: 8 months ago   SUBJECTIVE:    SUBJECTIVE STATEMENT: 04/18/2022 States that he was lifting boxes and  went to the ED on Monday because he pulled a muscle in his back. States he didn't sleep as he was in so much pain. States that his left leg burns in the thigh especially at night. States that he got patches to help with pain. States he alternates between left and right sides with sleeping.    09/24/21 he got his first RSV shot and the next couple days he started getting leg cramping and pain and the pain has not let up. States that he has run for 70 years and never has had any problems. States his right knee buckles on occasion.  States he has cut down his running 3x/week and some sprinting. States he as running at a higher intensity.  Has tried prednisone         PERTINENT HISTORY: Lung emphysema PAIN:  Are you having pain? no: NPRS scale: 5/10 Pain location: low back and left thigh  Pain description: aching, intense grabbing Aggravating factors: walking, standing, running Relieving factors: rest, positioning   PRECAUTIONS: None   WEIGHT BEARING RESTRICTIONS: No   FALLS:  Has patient fallen in last 6 months? No     OCCUPATION: retired, singing, gardening, running,    PLOF: Independent   PATIENT GOALS: to have less pain      OBJECTIVE:    DIAGNOSTIC FINDINGS: 03/08/22 Pelvis xray IMPRESSION: 1. Bilateral hip degenerative changes, greater on the right. 2.  Lower lumbar spine degenerative changes.   Right knee xray FINDINGS: Mild-to-moderate medial joint space narrowing. Mild spur formation involving all 3 joint compartments. The lateral view is slightly oblique with no gross effusion seen. Atheromatous arterial calcifications.   IMPRESSION: Mild tricompartmental degenerative changes, as described above.           LE Measurements       Lower Extremity Right 04/09/22 Left 04/09/2022    A/PROM MMT A/PROM MMT  Hip Flexion WFL* 4 WFL* 4  Hip Extension 0 3+ 0 3*  Hip Abduction          Hip Adduction          Hip Internal rotation 10   30    Hip External  rotation 45   55    Knee Flexion 125 4- 125 4-  Knee Extension 0 4 0 4  Ankle Dorsiflexion   4+   4+  Ankle Plantarflexion          Ankle Inversion          Ankle Eversion           (Blank rows = not tested) * pain-twinges              Lumbar  A/PROM:    4/1//2024      Flexion 50% limited      Extension  100% limited     R ROT       L ROT       R SB  75% limited    L SB 50% limited                          * Pain              (Blank rows = not tested)          TODAY'S TREATMENT:                                                                                                                              DATE:  04/18/2022    Therapeutic Exercise: Seated: Piriformis stretch x3 30" holds Prone: on pillow 3 minutes, no pillow 3 minutes, POE 3 minutes - breathing throughout, hip/knee ROM 5 minutes Neuromuscular Re-education: Manual Therapy: Gait Training: walking with cane 10 minutes  Self Care: Trigger Point Dry Needling:  Modalities:      PATIENT EDUCATION:  Education details: on HEP, on current presentation Person educated: Patient Education method: Programmer, multimedia, Facilities manager, and Handouts Education comprehension: verbalized understanding     HOME EXERCISE PROGRAM: [id number]   ASSESSMENT:   CLINICAL IMPRESSION: 04/18/2022 Patient with recent injury over weekend causing increased pain in thigh and left leg in excessive IR with walking and at rest. Educated patietn on safe use of walking with cane as he is not picking up left leg well. Educated patient in use of pillow between knees for sleeping. Will continue  too investigate SIJ/pelvis as indicated in future sessions.   Eval: Patient presents to physical therapy with complaints of right lower extremity pain that started last year a couple days after RSV vaccination.  Patient presents with muscle guarding, pain, range of motion and strength deficits in right lower extremity.  Discussed findings and plan for physical  therapy moving forward.  Patient with desire to continue to run and remain active which pain is currently limiting his intensity of exercise at this time.  Patient would greatly benefit from skilled physical therapy to reduce risk of further injury and return patient to optimal function.   OBJECTIVE IMPAIRMENTS: decreased activity tolerance, decreased mobility, difficulty walking, decreased ROM, decreased strength, and pain.    ACTIVITY LIMITATIONS: standing, locomotion level, and running   PARTICIPATION LIMITATIONS: community activity, yard work, and recreational activity   PERSONAL FACTORS: Age are also affecting patient's functional outcome.    REHAB POTENTIAL: Good   CLINICAL DECISION MAKING: Stable/uncomplicated   EVALUATION COMPLEXITY: Low     GOALS: Goals reviewed with patient? yes   SHORT TERM GOALS: Target date: 04/11/2022  Patient will be independent in self management strategies to improve quality of life and functional outcomes. Baseline: New Program Goal status: MET   2.  Patient will report at least 50% improvement in overall symptoms and/or function to demonstrate improved functional mobility Baseline: 0% better Goal status: MET - 80% better   3.  Patient will demonstrate pain-free MMT in bilateral lower extremities Baseline: Painful Goal status: MET       LONG TERM GOALS: Target date: 05/02/2022    Patient will report at least 75% improvement in overall symptoms and/or function to demonstrate improved functional mobility Baseline: 0%  Goal status: Met   2.  Patient will be able to walk without right lower extremity pain to improve ability to walk at home and in the community Goal status: MET   3.  Patient will demonstrate at least 10 degree improvement in right hip internal rotation to demonstrate improved gross hip mobility. Baseline: See above Goal status: PROGRESSING     PLAN:   PT FREQUENCY: 2x/week   PT DURATION: 6 weeks   PLANNED  INTERVENTIONS: Therapeutic exercises, Therapeutic activity, Neuromuscular re-education, Balance training, Gait training, Patient/Family education, Self Care, Joint mobilization, Joint manipulation, Stair training, Vestibular training, Orthotic/Fit training, DME instructions, Aquatic Therapy, Dry Needling, Electrical stimulation, Spinal manipulation, Spinal mobilization, Cryotherapy, Moist heat, Traction, Ultrasound, Ionotophoresis 4mg /ml Dexamethasone, Manual therapy, and Re-evaluation   PLAN FOR NEXT SESSION: Isometrics, range of motion in hip and lumbar spine, STM and self STM strategies, strengthening of bilateral lower extremities.  Trial right knee traction/lumbar traction     1:51 PM, 04/18/22 Tereasa CoopMichele Mikhaela Zaugg, DPT [facility]

## 2022-04-19 ENCOUNTER — Other Ambulatory Visit: Payer: Self-pay | Admitting: Cardiothoracic Surgery

## 2022-04-19 ENCOUNTER — Telehealth: Payer: Self-pay

## 2022-04-19 DIAGNOSIS — I7121 Aneurysm of the ascending aorta, without rupture: Secondary | ICD-10-CM

## 2022-04-19 NOTE — Telephone Encounter (Signed)
     Patient  visit on 4/8  at [facility]    Have you been able to follow up with your primary care physician? Yes   The patient was or was not able to obtain any needed medicine or equipment. Yes   Are there diet recommendations that you are having difficulty following? Na   Patient expresses understanding of discharge instructions and education provided has no other needs at this time. Yes      Lizandro Spellman [facility] Guide, [place] [phone] [address], [place], [place] [address] Phone: [phone] Email: [email or web address]    

## 2022-04-24 ENCOUNTER — Encounter: Payer: Self-pay | Admitting: Family Medicine

## 2022-04-24 ENCOUNTER — Ambulatory Visit (INDEPENDENT_AMBULATORY_CARE_PROVIDER_SITE_OTHER): Payer: Medicare PPO | Admitting: Family Medicine

## 2022-04-24 VITALS — BP 146/82 | HR 54 | Ht 69.0 in | Wt 140.0 lb

## 2022-04-24 DIAGNOSIS — L812 Freckles: Secondary | ICD-10-CM | POA: Diagnosis not present

## 2022-04-24 DIAGNOSIS — M7061 Trochanteric bursitis, right hip: Secondary | ICD-10-CM | POA: Diagnosis not present

## 2022-04-24 DIAGNOSIS — M25552 Pain in left hip: Secondary | ICD-10-CM | POA: Diagnosis not present

## 2022-04-24 DIAGNOSIS — L820 Inflamed seborrheic keratosis: Secondary | ICD-10-CM | POA: Diagnosis not present

## 2022-04-24 DIAGNOSIS — G8929 Other chronic pain: Secondary | ICD-10-CM | POA: Diagnosis not present

## 2022-04-24 DIAGNOSIS — L821 Other seborrheic keratosis: Secondary | ICD-10-CM | POA: Diagnosis not present

## 2022-04-24 DIAGNOSIS — Z85828 Personal history of other malignant neoplasm of skin: Secondary | ICD-10-CM | POA: Diagnosis not present

## 2022-04-24 DIAGNOSIS — L570 Actinic keratosis: Secondary | ICD-10-CM | POA: Diagnosis not present

## 2022-04-24 DIAGNOSIS — D1801 Hemangioma of skin and subcutaneous tissue: Secondary | ICD-10-CM | POA: Diagnosis not present

## 2022-04-24 NOTE — Progress Notes (Signed)
   I, [name], CMA acting as a Neurosurgeon for Danny Graham, MD.  Danny Gross is a 88 y.o. male who presents to [facility] at [facility] today for 6-wk f/u multifactorial R leg pain; thought to be related to his hip abductor and rotator weakness and patellofemoral related pain/DJD. Pt was last seen by Dr. Denyse Amass on 03/13/22 and was referred to PT, completing 7 visits. Of note, pt was seen at the [facility] ED on 04/16/22 c/o L-sided LBP.   Today, pt reports minimal improvement of sx since last visit. PT has been somewhat helpful but was expecting sx to have improved more. Concerns about possible stress fracture d/t continued thigh pain. Denies swelling in B LE. Denies pain in the leg with WB. Describes pain in the thigh as a burning sensation which improves with Lidocaine.   Dx imaging: 04/16/22 L-spine XR 03/08/22 R hip & R knee XR   Pertinent review of systems: No fevers or chills  Relevant historical information: Hip arthritis   Exam:  BP (!) 146/82   Pulse (!) 54   Ht  (1.753 m)   Wt 140 lb (63.5 kg)   SpO2 99%   BMI 20.67 kg/m  General: Well Developed, well nourished, and in no acute distress.   MSK: Left hip decreased motion. Lumbar spine nontender palpation midline decreased lumbar motion.  Lower extremity strength generally intact    Lab and Radiology Results  EXAM: DG HIP (WITH OR WITHOUT PELVIS) 2-3V RIGHT   COMPARISON:  None Available.   FINDINGS: Marked right knee joint space narrowing with associated moderate spur formation and sclerosis. Milder similar changes on the left. Lower lumbar spine degenerative changes.   IMPRESSION: 1. Bilateral hip degenerative changes, greater on the right. 2. Lower lumbar spine degenerative changes.     Electronically Signed   By: Beckie Salts M.D.   On: 03/10/2022 11:03 I, Danny Gross, personally (independently) visualized and performed the interpretation of the images attached in this  note.     Assessment and Plan: 88 y.o. male with left lateral hip pain and anterior hip pain and thigh pain.  Likely multifactorial.  He likely does have a tendinopathy component in the lateral hip to explain the majority of his pain.  He does have some anterior thigh burning pain which could be referred pain from hip arthritis or could be lumbar radiculopathy at L3.  After discussion plan for MRI left hip potentially followed by MRI lumbar spine based on the results.  Consider trial of diagnostic intra-articular hip injection as well especially based on the MRI results.   PDMP not reviewed this encounter. Orders Placed This Encounter  Procedures   MR HIP LEFT WO CONTRAST    Standing Status:   Future    Standing Expiration Date:   04/24/2023    Order Specific Question:   What is the patient's sedation requirement?    Answer:   No Sedation    Order Specific Question:   Does the patient have a pacemaker or implanted devices?    Answer:   No    Order Specific Question:   Preferred imaging location?    Answer:   [facility] (table limit-550lbs)   No orders of the defined types were placed in this encounter.    Discussed warning signs or symptoms. Please see discharge instructions. Patient expresses understanding.   The above documentation has been reviewed and is accurate and complete Danny Gross, M.D.

## 2022-04-24 NOTE — Patient Instructions (Signed)
Thank you for coming in today.   You should hear from MRI scheduling within 1 week. If you do not hear please let me know.    Recheck following the MRI.   Please let me know if you have a problem.

## 2022-04-25 ENCOUNTER — Ambulatory Visit: Payer: Medicare PPO | Admitting: Physical Therapy

## 2022-04-25 ENCOUNTER — Encounter: Payer: Self-pay | Admitting: Physical Therapy

## 2022-04-25 DIAGNOSIS — R262 Difficulty in walking, not elsewhere classified: Secondary | ICD-10-CM

## 2022-04-25 DIAGNOSIS — M79604 Pain in right leg: Secondary | ICD-10-CM | POA: Diagnosis not present

## 2022-04-25 DIAGNOSIS — M6281 Muscle weakness (generalized): Secondary | ICD-10-CM | POA: Diagnosis not present

## 2022-04-25 NOTE — Therapy (Signed)
OUTPATIENT PHYSICAL THERAPY TREATMENT NOTE   Patient Name: Danny Gross MRN: [id number] DOB:12-Feb-1934, 88 y.o., male Today's Date: 04/25/2022  PCP: Hillard Danker, Mervyn Skeeters MD   REFERRING PROVIDER: Rodolph Bong, MD  END OF SESSION:   PT End of Session - 04/25/22 1305     Visit Number 8    Number of Visits 12    Date for PT Re-Evaluation 05/02/22    Authorization Type humana -12 visits from 03/21/2022 - 05/02/2022    Authorization - Visit Number 8    Authorization - Number of Visits 12    PT Start Time 1306    PT Stop Time 1344    PT Time Calculation (min) 38 min    Activity Tolerance Patient tolerated treatment well    Behavior During Therapy [facility] for tasks assessed/performed             Past Medical History:  Diagnosis Date   Cataract    Emphysema of lung    Past Surgical History:  Procedure Laterality Date   HERNIA REPAIR  2003   TONSILLECTOMY  1939   VIDEO BRONCHOSCOPY Bilateral 08/11/2013   Procedure: VIDEO BRONCHOSCOPY WITH FLUORO;  Surgeon: Nyoka Cowden, MD;  Location: [facility];  Service: Cardiopulmonary;  Laterality: Bilateral;   Patient Active Problem List   Diagnosis Date Noted   Primary osteoarthritis of right hip 03/13/2022   Myalgia 11/28/2021   Thoracic aortic aneurysm 06/07/2021   Aortic atherosclerosis 07/19/2020   Bronchiectasis without complication 04/22/2018   Impaired fasting glucose 10/04/2014   Routine general medical examination at a health care facility 12/21/2013   Pulmonary infiltrates 07/13/2013     THERAPY DIAG:  Muscle weakness (generalized)  Pain in right leg  Difficulty in walking, not elsewhere classified    REFERRING DIAG: M70.61 (ICD-10-CM) - Trochanteric bursitis of right hip M25.561,G89.29 (ICD-10-CM) - Chronic pain of right knee    Rationale for Evaluation and Treatment: Rehabilitation   ONSET DATE: 8 months ago   SUBJECTIVE:    SUBJECTIVE STATEMENT: 04/25/2022 States he saw MD and they ordered an  MRI of the left hip.States he did a lot of work around the house and he had some pain last night. States it feels better today. States that lidocaine helps with burning pain.    09/24/21 he got his first RSV shot and the next couple days he started getting leg cramping and pain and the pain has not let up. States that he has run for 70 years and never has had any problems. States his right knee buckles on occasion.  States he has cut down his running 3x/week and some sprinting. States he as running at a higher intensity.  Has tried prednisone         PERTINENT HISTORY: Lung emphysema PAIN:  Are you having pain? no: NPRS scale: 5/10 Pain location: low back and left thigh  Pain description: aching, intense grabbing Aggravating factors: walking, standing, running Relieving factors: rest, positioning   PRECAUTIONS: None   WEIGHT BEARING RESTRICTIONS: No   FALLS:  Has patient fallen in last 6 months? No     OCCUPATION: retired, singing, gardening, running,    PLOF: Independent   PATIENT GOALS: to have less pain      OBJECTIVE:    DIAGNOSTIC FINDINGS: 03/08/22 Pelvis xray IMPRESSION: 1. Bilateral hip degenerative changes, greater on the right. 2. Lower lumbar spine degenerative changes.   Right knee xray FINDINGS: Mild-to-moderate medial joint space narrowing. Mild spur formation involving all 3  joint compartments. The lateral view is slightly oblique with no gross effusion seen. Atheromatous arterial calcifications.   IMPRESSION: Mild tricompartmental degenerative changes, as described above.           LE Measurements       Lower Extremity Right 04/09/22 Left 04/09/2022    A/PROM MMT A/PROM MMT  Hip Flexion WFL* 4 WFL* 4  Hip Extension 0 3+ 0 3*  Hip Abduction          Hip Adduction          Hip Internal rotation 10   30    Hip External rotation 45   55    Knee Flexion 125 4- 125 4-  Knee Extension 0 4 0 4  Ankle Dorsiflexion   4+   4+  Ankle  Plantarflexion          Ankle Inversion          Ankle Eversion           (Blank rows = not tested) * pain-twinges              Lumbar  A/PROM:    4/1//2024      Flexion 50% limited      Extension  100% limited     R ROT       L ROT       R SB  75% limited    L SB 50% limited                          * Pain              (Blank rows = not tested)          TODAY'S TREATMENT:                                                                                                                              DATE:  04/25/2022   Therapeutic Exercise: Seated: self mobilization with tiger tail 5 minutes  Supine: SAQs x15 5" hold B, HIP ER PROM 5 minutes, hip ext iso x10 5" holds, hip abd RTB x25 B one at a time S/l: clams and reverse clams PT assist 10 minutes Neuromuscular Re-education: Manual Therapy: lumbar traction on ball  10 minutes Gait Training: Self Care: Trigger Point Dry Needling:  Modalities:      PATIENT EDUCATION:  Education details: on HEP Person educated: Patient Education method: Explanation, Facilities manager, and Handouts Education comprehension: verbalized understanding     HOME EXERCISE PROGRAM: [id number]   ASSESSMENT:   CLINICAL IMPRESSION: 04/25/2022 Session focused on pain management and gentle activation. This was tolerated moderately well. Twinges in hip noted with all exercises. Cued patient to perform all exercises within painfree ROM. Continued limitations with therapy, awaiting MRI of hip. Will continue with current POC as tolerated.   Eval: Patient presents to physical therapy with complaints of right lower extremity  pain that started last year a couple days after RSV vaccination.  Patient presents with muscle guarding, pain, range of motion and strength deficits in right lower extremity.  Discussed findings and plan for physical therapy moving forward.  Patient with desire to continue to run and remain active which pain is currently limiting his intensity  of exercise at this time.  Patient would greatly benefit from skilled physical therapy to reduce risk of further injury and return patient to optimal function.   OBJECTIVE IMPAIRMENTS: decreased activity tolerance, decreased mobility, difficulty walking, decreased ROM, decreased strength, and pain.    ACTIVITY LIMITATIONS: standing, locomotion level, and running   PARTICIPATION LIMITATIONS: community activity, yard work, and recreational activity   PERSONAL FACTORS: Age are also affecting patient's functional outcome.    REHAB POTENTIAL: Good   CLINICAL DECISION MAKING: Stable/uncomplicated   EVALUATION COMPLEXITY: Low     GOALS: Goals reviewed with patient? yes   SHORT TERM GOALS: Target date: 04/11/2022  Patient will be independent in self management strategies to improve quality of life and functional outcomes. Baseline: New Program Goal status: MET   2.  Patient will report at least 50% improvement in overall symptoms and/or function to demonstrate improved functional mobility Baseline: 0% better Goal status: MET - 80% better   3.  Patient will demonstrate pain-free MMT in bilateral lower extremities Baseline: Painful Goal status: MET       LONG TERM GOALS: Target date: 05/02/2022    Patient will report at least 75% improvement in overall symptoms and/or function to demonstrate improved functional mobility Baseline: 0%  Goal status: Met   2.  Patient will be able to walk without right lower extremity pain to improve ability to walk at home and in the community Goal status: MET   3.  Patient will demonstrate at least 10 degree improvement in right hip internal rotation to demonstrate improved gross hip mobility. Baseline: See above Goal status: PROGRESSING     PLAN:   PT FREQUENCY: 2x/week   PT DURATION: 6 weeks   PLANNED INTERVENTIONS: Therapeutic exercises, Therapeutic activity, Neuromuscular re-education, Balance training, Gait training, Patient/Family  education, Self Care, Joint mobilization, Joint manipulation, Stair training, Vestibular training, Orthotic/Fit training, DME instructions, Aquatic Therapy, Dry Needling, Electrical stimulation, Spinal manipulation, Spinal mobilization, Cryotherapy, Moist heat, Traction, Ultrasound, Ionotophoresis /ml Dexamethasone, Manual therapy, and Re-evaluation   PLAN FOR NEXT SESSION: Isometrics, range of motion in hip and lumbar spine, STM and self STM strategies, strengthening of bilateral lower extremities.  Trial right knee traction/lumbar traction     1:49 PM, 04/25/22 Tereasa Coop, DPT Physical Therapy with [facility]

## 2022-05-02 ENCOUNTER — Ambulatory Visit: Payer: Medicare PPO | Admitting: Physical Therapy

## 2022-05-02 ENCOUNTER — Encounter: Payer: Self-pay | Admitting: Physical Therapy

## 2022-05-02 DIAGNOSIS — M6281 Muscle weakness (generalized): Secondary | ICD-10-CM | POA: Diagnosis not present

## 2022-05-02 DIAGNOSIS — M79604 Pain in right leg: Secondary | ICD-10-CM | POA: Diagnosis not present

## 2022-05-02 DIAGNOSIS — R262 Difficulty in walking, not elsewhere classified: Secondary | ICD-10-CM | POA: Diagnosis not present

## 2022-05-02 NOTE — Therapy (Signed)
OUTPATIENT PHYSICAL THERAPY TREATMENT NOTE  Progress Note Reporting Period 03/21/22 to 05/02/22  See note below for Objective Data and Assessment of Progress/Goals.     Patient Name: Danny Gross MRN: [id number] DOB:[birth year], 88 y.o., male Today's Date: 05/02/2022  PCP: Hillard Danker, Mervyn Skeeters MD   REFERRING PROVIDER: Rodolph Bong, MD  END OF SESSION:   PT End of Session - 05/02/22 1303     Visit Number 9    Number of Visits 12    Date for PT Re-Evaluation 05/02/22    Authorization Type humana -12 visits from 03/21/2022 - 05/02/2022    Authorization - Visit Number 9    Authorization - Number of Visits 12    PT Start Time 1303    PT Stop Time 1341    PT Time Calculation (min) 38 min    Activity Tolerance Patient tolerated treatment well    Behavior During Therapy [facility] for tasks assessed/performed             Past Medical History:  Diagnosis Date   Cataract    Emphysema of lung    Past Surgical History:  Procedure Laterality Date   HERNIA REPAIR  2003   TONSILLECTOMY  1939   VIDEO BRONCHOSCOPY Bilateral 08/11/2013   Procedure: VIDEO BRONCHOSCOPY WITH FLUORO;  Surgeon: Nyoka Cowden, MD;  Location: [facility];  Service: Cardiopulmonary;  Laterality: Bilateral;   Patient Active Problem List   Diagnosis Date Noted   Primary osteoarthritis of right hip 03/13/2022   Myalgia 11/28/2021   Thoracic aortic aneurysm 06/07/2021   Aortic atherosclerosis 07/19/2020   Bronchiectasis without complication 04/22/2018   Impaired fasting glucose 10/04/2014   Routine general medical examination at a health care facility 12/21/2013   Pulmonary infiltrates 07/13/2013     THERAPY DIAG:  Muscle weakness (generalized)  Difficulty in walking, not elsewhere classified  Pain in right leg    REFERRING DIAG: M70.61 (ICD-10-CM) - Trochanteric bursitis of right hip M25.561,G89.29 (ICD-10-CM) - Chronic pain of right knee    Rationale for Evaluation and Treatment:  Rehabilitation   ONSET DATE: 8 months ago   SUBJECTIVE:    SUBJECTIVE STATEMENT: 05/02/2022 States he s is doing a little better. States he continues to use the lidocaine patches and has yet to return to running yet.   09/24/21 he got his first RSV shot and the next couple days he started getting leg cramping and pain and the pain has not let up. States that he has run for 70 years and never has had any problems. States his right knee buckles on occasion.  States he has cut down his running 3x/week and some sprinting. States he as running at a higher intensity.  Has tried prednisone         PERTINENT HISTORY: Lung emphysema PAIN:  Are you having pain? no: NPRS scale: 5/10 Pain location: low back and left thigh  Pain description: aching, intense grabbing Aggravating factors: walking, standing, running Relieving factors: rest, positioning   PRECAUTIONS: None   WEIGHT BEARING RESTRICTIONS: No   FALLS:  Has patient fallen in last 6 months? No     OCCUPATION: retired, singing, gardening, running,    PLOF: Independent   PATIENT GOALS: to have less pain      OBJECTIVE:    DIAGNOSTIC FINDINGS: 03/08/22 Pelvis xray IMPRESSION: 1. Bilateral hip degenerative changes, greater on the right. 2. Lower lumbar spine degenerative changes.   Right knee xray FINDINGS: Mild-to-moderate medial joint space narrowing. Mild spur formation  involving all 3 joint compartments. The lateral view is slightly oblique with no gross effusion seen. Atheromatous arterial calcifications.   IMPRESSION: Mild tricompartmental degenerative changes, as described above.           LE Measurements       Lower Extremity Right 05/02/22 Left 05/02/2022    A/PROM MMT A/PROM MMT  Hip Flexion WFL* 4* WFL* 4+  Hip Extension 0 3 0 3*  Hip Abduction          Hip Adduction          Hip Internal rotation 10   30**    Hip External rotation 30   60**    Knee Flexion 125 4 125 4  Knee Extension 0 4 0  4  Ankle Dorsiflexion   4+   4+  Ankle Plantarflexion          Ankle Inversion          Ankle Eversion           (Blank rows = not tested) * pain-twinges              Lumbar  A/PROM:    4/24//2024      Flexion 50% limited      Extension  100% limited     R ROT       L ROT       R SB  75% limited    L SB 50% limited                          * Pain              (Blank rows = not tested)          TODAY'S TREATMENT:                                                                                                                              DATE:  05/02/2022   Therapeutic Exercise: Seated: piriformis stretch x3 30" holds B Supine:   S/l:  Neuromuscular Re-education: Manual Therapy: Gait Training: Self Care: Trigger Point Dry Needling:  Modalities:      PATIENT EDUCATION:  Education details:reviewed entire HEP, progress and POC  Person educated: Patient Education method: Explanation, Demonstration, and Handouts Education comprehension: verbalized understanding     HOME EXERCISE PROGRAM: [id number]   ASSESSMENT:   CLINICAL IMPRESSION: 05/02/2022 Overall patient is doing well but continues to have some pain in left and right hip and has not returned to running due to left hip pain. Reviewed HEP and discussed MRI for left hip that was ordered by MD. Will place patient on hold secondary to need for MRI and MD f/u prior to continue PT.   Eval: Patient presents to physical therapy with complaints of right lower extremity pain that started last year a couple days after RSV vaccination.  Patient presents with muscle guarding, pain, range  of motion and strength deficits in right lower extremity.  Discussed findings and plan for physical therapy moving forward.  Patient with desire to continue to run and remain active which pain is currently limiting his intensity of exercise at this time.  Patient would greatly benefit from skilled physical therapy to reduce risk of further injury  and return patient to optimal function.   OBJECTIVE IMPAIRMENTS: decreased activity tolerance, decreased mobility, difficulty walking, decreased ROM, decreased strength, and pain.    ACTIVITY LIMITATIONS: standing, locomotion level, and running   PARTICIPATION LIMITATIONS: community activity, yard work, and recreational activity   PERSONAL FACTORS: Age are also affecting patient's functional outcome.    REHAB POTENTIAL: Good   CLINICAL DECISION MAKING: Stable/uncomplicated   EVALUATION COMPLEXITY: Low     GOALS: Goals reviewed with patient? yes   SHORT TERM GOALS: Target date: 04/11/2022  Patient will be independent in self management strategies to improve quality of life and functional outcomes. Baseline: New Program Goal status: MET   2.  Patient will report at least 50% improvement in overall symptoms and/or function to demonstrate improved functional mobility Baseline: 0% better Goal status: MET - 80% better   3.  Patient will demonstrate pain-free MMT in bilateral lower extremities Baseline: Painful Goal status: MET       LONG TERM GOALS: Target date: 05/02/2022    Patient will report at least 75% improvement in overall symptoms and/or function to demonstrate improved functional mobility Baseline: 0%  Goal status: Met   2.  Patient will be able to walk without right lower extremity pain to improve ability to walk at home and in the community Goal status: MET   3.  Patient will demonstrate at least 10 degree improvement in right hip internal rotation to demonstrate improved gross hip mobility. Baseline: See above Goal status: PROGRESSING     PLAN:   PT FREQUENCY: 2x/week   PT DURATION: 6 weeks   PLANNED INTERVENTIONS: Therapeutic exercises, Therapeutic activity, Neuromuscular re-education, Balance training, Gait training, Patient/Family education, Self Care, Joint mobilization, Joint manipulation, Stair training, Vestibular training, Orthotic/Fit training, DME  instructions, Aquatic Therapy, Dry Needling, Electrical stimulation, Spinal manipulation, Spinal mobilization, Cryotherapy, Moist heat, Traction, Ultrasound, Ionotophoresis 4mg /ml Dexamethasone, Manual therapy, and Re-evaluation   PLAN FOR NEXT SESSION: pt on HOLD for MRI and MD f/u   1:41 PM, 05/02/22 Tereasa Coop, DPT Physical Therapy with [facility]

## 2022-05-18 ENCOUNTER — Ambulatory Visit
Admission: RE | Admit: 2022-05-18 | Discharge: 2022-05-18 | Disposition: A | Discharge status: Home / Self Care | Payer: Medicare PPO | Source: Ambulatory Visit | Attending: Family Medicine | Admitting: Family Medicine

## 2022-05-18 DIAGNOSIS — G8929 Other chronic pain: Secondary | ICD-10-CM

## 2022-05-18 DIAGNOSIS — M25552 Pain in left hip: Secondary | ICD-10-CM | POA: Diagnosis not present

## 2022-05-25 ENCOUNTER — Telehealth: Payer: Self-pay

## 2022-05-25 NOTE — Telephone Encounter (Signed)
Appointment scheduled.

## 2022-05-25 NOTE — Telephone Encounter (Signed)
MRI results reviewed by pt via MyChart.   Please reach out to pt to assist with scheduling follow-up visit, thanks!    Per MRI report:  Left hip MRI shows arthritis and avascular necrosis.  This will cause pain in the front of your hip and groin area and likely will require hip replacement to get fully better.   You do have tendinitis at the sides of the hips on both sides that could get better with physical therapy.   Recommend return to clinic to go over the results in full detail and discussed treatment plan and options.  Seen by patient Danny Gross on 05/25/2022  9:59 AM

## 2022-05-25 NOTE — Progress Notes (Signed)
Left hip MRI shows arthritis and avascular necrosis.  This will cause pain in the front of your hip and groin area and likely will require hip replacement to get fully better.  You do have tendinitis at the sides of the hips on both sides that could get better with physical therapy.  Recommend return to clinic to go over the results in full detail and discussed treatment plan and options.

## 2022-05-28 DIAGNOSIS — Z9889 Other specified postprocedural states: Secondary | ICD-10-CM | POA: Diagnosis not present

## 2022-05-28 DIAGNOSIS — H43812 Vitreous degeneration, left eye: Secondary | ICD-10-CM | POA: Diagnosis not present

## 2022-05-28 DIAGNOSIS — Z961 Presence of intraocular lens: Secondary | ICD-10-CM | POA: Diagnosis not present

## 2022-05-28 DIAGNOSIS — H401233 Low-tension glaucoma, bilateral, severe stage: Secondary | ICD-10-CM | POA: Diagnosis not present

## 2022-05-29 ENCOUNTER — Ambulatory Visit (INDEPENDENT_AMBULATORY_CARE_PROVIDER_SITE_OTHER): Payer: Medicare PPO | Admitting: Family Medicine

## 2022-05-29 ENCOUNTER — Encounter: Payer: Self-pay | Admitting: Family Medicine

## 2022-05-29 ENCOUNTER — Telehealth: Payer: Self-pay | Admitting: Family Medicine

## 2022-05-29 VITALS — BP 148/72 | HR 62 | Ht 69.0 in | Wt 141.0 lb

## 2022-05-29 DIAGNOSIS — M160 Bilateral primary osteoarthritis of hip: Secondary | ICD-10-CM

## 2022-05-29 DIAGNOSIS — M25551 Pain in right hip: Secondary | ICD-10-CM

## 2022-05-29 DIAGNOSIS — M87051 Idiopathic aseptic necrosis of right femur: Secondary | ICD-10-CM

## 2022-05-29 DIAGNOSIS — G8929 Other chronic pain: Secondary | ICD-10-CM | POA: Diagnosis not present

## 2022-05-29 NOTE — Progress Notes (Unsigned)
I, Danny Gross, CMA acting as a scribe for Danny Graham, MD.  Danny Gross is a 88 y.o. male who presents to [facility] at [facility] today for f/u L hip pain w/ MRI review. Pt was last seen by Dr. Denyse Gross on 04/24/22 and was advised to proceed to L hip MRI. Today, pt reports slight worsening of right leg sx, more continuous and more severe with walking.  Dx imaging: 05/18/22 L hip MRI 04/16/22 L-spine XR 03/08/22 R hip & R knee XR   Pertinent review of systems: No fevers or chills  Relevant historical information: Generally pretty healthy.  Likes to run.   Exam:  BP (!) 148/72   Pulse 62   Ht 5\' 9"  (1.753 m)   Wt 141 lb (64 kg)   SpO2 96%   BMI 20.82 kg/m  General: Well Developed, well nourished, and in no acute distress.   MSK: Left hip decreased motion.    Lab and Radiology Results  EXAM: MR OF THE LEFT HIP WITHOUT CONTRAST   TECHNIQUE: Multiplanar, multisequence MR imaging was performed. No intravenous contrast was administered.   COMPARISON:  Radiographs 03/08/2022   FINDINGS: Significant/advanced degenerative changes involving the right hip. Sizable focus of AVN involving the right femoral head with some surrounding marrow edema and associated moderate to large joint effusion.   Moderate degenerative chondrosis involving the left hip with joint space narrowing, osteophytic spurring and subchondral cystic change. Associated small to moderate joint effusion and small paralabral cysts. The superior anterior labrum is degenerated and torn.   Mild bilateral peritrochanteric tendinosis but no trochanteric bursitis. The hip and pelvic musculature intact. Mild edema like signal changes in the pectineus and obturator externus muscles suggesting muscle strains. No discrete muscle tear or intramuscular hematoma. The hamstring tendons are intact.   The pubic symphysis and SI joints are intact. Mild degenerative changes. No sacral stress or  insufficiency fractures.   No significant intrapelvic abnormalities are identified. No inguinal mass or hernia.   IMPRESSION: 1. Significant/advanced degenerative changes involving the right hip. Sizable focus of AVN involving the right femoral head with some surrounding marrow edema and associated moderate to large joint effusion. 2. Moderate degenerative changes involving the left hip. 3. The superior anterior labrum of the left hip is degenerated and torn. 4. Mild bilateral peritrochanteric tendinosis but no trochanteric bursitis. 5. Mild edema like signal changes in the pectineus and obturator externus muscles suggesting muscle strains.     Electronically Signed   By: Rudie Meyer M.D.   On: 05/25/2022 08:16 I, Danny Gross, personally (independently) visualized and performed the interpretation of the images attached in this note.      Assessment and Plan: 88 y.o. male with hip pain.  He has arthritis and avascular necrosis of the right hip and moderate arthritis of the left hip.  Based on his MRI appearance I think it makes sense for him to have a consultation with orthopedic surgery regarding surgical options.  He ultimately will require hip replacement next year.  I could do trial of steroid injection into either hip but most surgeons do not want to do hip replacement within 3 months of steroid injection as it can interfere with bone healing.  Additionally steroid injections will worsen avascular necrosis.  His daughter had hip replacement with some surgeon in [place].  He is not sure who it was and will double check and let me know.  Certainly we can place referrals wherever.   PDMP  not reviewed this encounter. No orders of the defined types were placed in this encounter.  No orders of the defined types were placed in this encounter.    Discussed warning signs or symptoms. Please see discharge instructions. Patient expresses understanding.   The above documentation  has been reviewed and is accurate and complete Danny Gross, M.D. Total encounter time 30 minutes including face-to-face time with the patient and, reviewing past medical record, and charting on the date of service.   Reviewed MRI imaging and documentation.  Talked about treatment options.

## 2022-05-29 NOTE — Telephone Encounter (Signed)
Patient would like to know if Dr Denyse Amass thinks that PT would help him?  He has done PT before at [facility] and did well with it.  Please advise.

## 2022-05-29 NOTE — Patient Instructions (Addendum)
Thank you for coming in today.   I think it makes sense for you to have a consultation with orthopedics.   Let me know who your daughter saw.  Happy to place a referral any time.

## 2022-05-30 NOTE — Telephone Encounter (Signed)
Forwarding to Dr. Denyse Amass to review and advise.   Per visit note 05/29/22: Assessment and Plan: 88 y.o. male with hip pain.  He has arthritis and avascular necrosis of the right hip and moderate arthritis of the left hip.  Based on his MRI appearance I think it makes sense for him to have a consultation with orthopedic surgery regarding surgical options.  He ultimately will require hip replacement next year.  I could do trial of steroid injection into either hip but most surgeons do not want to do hip replacement within 3 months of steroid injection as it can interfere with bone healing.  Additionally steroid injections will worsen avascular necrosis.  His daughter had hip replacement with some surgeon in [place].  He is not sure who it was and will double check and let me know.  Certainly we can place referrals wherever.

## 2022-05-30 NOTE — Progress Notes (Deleted)
      [address]       [place] [address]             [phone]        Danny Gross [id number] April 30, 1934  History of Present Illness:  Danny Gross is an 88 yo male with history of HTN, Emphysema and Ascending Aortic Aneurysm.  This initially measured 4.1 cm, however at this last visit in our office 05/2021 this had shown interval growth to 4.4 cm.  He presents today for 1 year surveillance follow up.     Current Outpatient Medications on File Prior to Visit  Medication Sig Dispense Refill   amLODipine (NORVASC) 5 MG tablet TAKE 1 TABLET (5 MG TOTAL) BY MOUTH DAILY. 90 tablet 1   brimonidine-timolol (COMBIGAN) 0.2-0.5 % ophthalmic solution Place 1 drop into both eyes every 12 (twelve) hours.     co-enzyme Q-10 30 MG capsule Take 30 mg by mouth daily.     dorzolamide (TRUSOPT) 2 % ophthalmic solution Place 1 drop into both eyes 2 (two) times daily.     guaiFENesin (MUCINEX) 600 MG 12 hr tablet Take 600 mg by mouth 2 (two) times daily as needed.     lidocaine (LIDODERM) 5 % Place 1 patch onto the skin daily. Remove & Discard patch within 12 hours or as directed by MD 30 patch 0   ROCKLATAN 0.02-0.005 % SOLN Apply 1 drop to eye at bedtime.     No current facility-administered medications on file prior to visit.     There were no vitals taken for this visit.  Physical Exam  CTA Results:      A/P:      Risk Modification:  Statin:  ***  Smoking cessation instruction/counseling given:  {CHL AMB [facility] SMOKING CESSATION COUNSELING:20758}  Patient was counseled on importance of Blood Pressure Control.  Despite Medical intervention if the patient notices persistently elevated blood pressure readings.  They are instructed to contact their Primary Care Physician  Please avoid use of Fluoroquinolones as this can potentially increase your risk of Aortic Rupture and/or Dissection  Patient educated on signs and symptoms of Aortic Dissection, handout  also provided in AVS  Rexine Gowens, PA-C 05/30/22

## 2022-05-31 NOTE — Telephone Encounter (Signed)
Referral placed to PT at [facility].

## 2022-06-05 ENCOUNTER — Other Ambulatory Visit: Payer: Medicare PPO

## 2022-06-05 ENCOUNTER — Ambulatory Visit: Payer: Medicare PPO

## 2022-06-11 ENCOUNTER — Encounter: Payer: Self-pay | Admitting: Family Medicine

## 2022-06-11 DIAGNOSIS — G8929 Other chronic pain: Secondary | ICD-10-CM

## 2022-06-11 DIAGNOSIS — M1611 Unilateral primary osteoarthritis, right hip: Secondary | ICD-10-CM

## 2022-06-19 DIAGNOSIS — Z8249 Family history of ischemic heart disease and other diseases of the circulatory system: Secondary | ICD-10-CM | POA: Diagnosis not present

## 2022-06-19 DIAGNOSIS — I10 Essential (primary) hypertension: Secondary | ICD-10-CM | POA: Diagnosis not present

## 2022-06-19 DIAGNOSIS — M545 Low back pain, unspecified: Secondary | ICD-10-CM | POA: Diagnosis not present

## 2022-06-19 DIAGNOSIS — I719 Aortic aneurysm of unspecified site, without rupture: Secondary | ICD-10-CM | POA: Diagnosis not present

## 2022-06-19 DIAGNOSIS — A159 Respiratory tuberculosis unspecified: Secondary | ICD-10-CM | POA: Diagnosis not present

## 2022-06-19 DIAGNOSIS — M1990 Unspecified osteoarthritis, unspecified site: Secondary | ICD-10-CM | POA: Diagnosis not present

## 2022-06-19 DIAGNOSIS — H409 Unspecified glaucoma: Secondary | ICD-10-CM | POA: Diagnosis not present

## 2022-06-19 DIAGNOSIS — Z961 Presence of intraocular lens: Secondary | ICD-10-CM | POA: Diagnosis not present

## 2022-07-11 NOTE — Therapy (Signed)
OUTPATIENT PHYSICAL THERAPY TREATMENT NOTE, RECERT      Patient Name: Danny Gross MRN: [id number] DOB:Jun 07, 1934, 88 y.o., male Today's Date: 07/17/2022  PCP: Hillard Danker, Mervyn Skeeters MD   REFERRING PROVIDER: Rodolph Bong, MD  END OF SESSION:   PT End of Session - 07/17/22 1307     Visit Number 10    Number of Visits 18    Date for PT Re-Evaluation 09/11/22    Authorization Type humana -12 visits from 03/21/2022 - 05/02/2022, auth requested    Authorization - Visit Number 1    Authorization - Number of Visits --    PT Start Time 1307    PT Stop Time 1350    PT Time Calculation (min) 43 min    Activity Tolerance Patient limited by pain    Behavior During Therapy [facility] for tasks assessed/performed              Past Medical History:  Diagnosis Date   Cataract    Emphysema of lung [facility])    Past Surgical History:  Procedure Laterality Date   HERNIA REPAIR  2003   TONSILLECTOMY  1939   VIDEO BRONCHOSCOPY Bilateral 08/11/2013   Procedure: VIDEO BRONCHOSCOPY WITH FLUORO;  Surgeon: Nyoka Cowden, MD;  Location: [facility];  Service: Cardiopulmonary;  Laterality: Bilateral;   Patient Active Problem List   Diagnosis Date Noted   Bilateral hip joint arthritis 03/13/2022   Myalgia 11/28/2021   Thoracic aortic aneurysm (HCC) 06/07/2021   Aortic atherosclerosis (HCC) 07/19/2020   Bronchiectasis without complication (HCC) 04/22/2018   Impaired fasting glucose 10/04/2014   Routine general medical examination at a health care facility 12/21/2013   Pulmonary infiltrates 07/13/2013     THERAPY DIAG:  Muscle weakness (generalized)  Difficulty in walking, not elsewhere classified  Pain in right leg    REFERRING DIAG:  M25.551,G89.29 (ICD-10-CM) - Chronic pain of right hip  M87.051 (ICD-10-CM) - Avascular necrosis of bone of right hip (HCC)  M16.0 (ICD-10-CM) - Bilateral hip joint arthritis      Rationale for Evaluation and Treatment: Rehabilitation   ONSET  DATE: 8 months ago   SUBJECTIVE:    SUBJECTIVE STATEMENT: 07/17/2022 States that he saw the MD today for his hip and will have a  R hip replacement in 6 weeks.    States he s is doing a little better. States he continues to use the lidocaine patches and has yet to return to running yet.   09/24/21 he got his first RSV shot and the next couple days he started getting leg cramping and pain and the pain has not let up. States that he has run for 70 years and never has had any problems. States his right knee buckles on occasion.  States he has cut down his running 3x/week and some sprinting. States he as running at a higher intensity.  Has tried prednisone         PERTINENT HISTORY: Lung emphysema PAIN:  Are you having pain? no: NPRS scale: 5/10 Pain location:  right thigh/hip  Pain description: aching, intense grabbing Aggravating factors: walking, standing, , getting in and out of car Relieving factors: rest, positioning   PRECAUTIONS: None   WEIGHT BEARING RESTRICTIONS: No   FALLS:  Has patient fallen in last 6 months? No     OCCUPATION: retired, singing, gardening, running,    PLOF: Independent   PATIENT GOALS: to have less pain      OBJECTIVE:    DIAGNOSTIC FINDINGS: 03/08/22  Pelvis xray IMPRESSION: 1. Bilateral hip degenerative changes, greater on the right. 2. Lower lumbar spine degenerative changes.   Right knee xray FINDINGS: Mild-to-moderate medial joint space narrowing. Mild spur formation involving all 3 joint compartments. The lateral view is slightly oblique with no gross effusion seen. Atheromatous arterial calcifications.   IMPRESSION: Mild tricompartmental degenerative changes, as described above.   Left MRI 05/18/22   IMPRESSION: 1. Significant/advanced degenerative changes involving the right hip. Sizable focus of AVN involving the right femoral head with some surrounding marrow edema and associated moderate to large joint effusion. 2.  Moderate degenerative changes involving the left hip. 3. The superior anterior labrum of the left hip is degenerated and torn. 4. Mild bilateral peritrochanteric tendinosis but no trochanteric bursitis. 5. Mild edema like signal changes in the pectineus and obturator externus muscles suggesting muscle strains.        LE Measurements       Lower Extremity Right 07/17/22 Left 07/17/22    A/PROM MMT A/PROM MMT  Hip Flexion WFL* 4-* WFL* 4  Hip Extension 5 ( lacking) 2+* 5 (lacking) 3*  Hip Abduction          Hip Adduction          Hip Internal rotation 10*   45    Hip External rotation 30*   20*    Knee Flexion 125 4 125 4  Knee Extension 0 4 0 4  Ankle Dorsiflexion   4+   4+  Ankle Plantarflexion          Ankle Inversion          Ankle Eversion           (Blank rows = not tested) * pain-twinges    Bed mobilities: slow labored movements - no increase in pain.    Special tests/Observations: Leg elevation test: pale left foot no pain  Leg drop test- redness returned > 60 seconds after elevation on L  B pitting edema 4+, rubor noted with feet dependent position B   TODAY'S TREATMENT:                                                                                                                              DATE:  07/17/2022   Therapeutic Exercise: Seated: piriformis stretch x2 30" holds B, bent knee fall outs x15 B, ankle pumps x20 Bm hip add iso x5 5" holds, hip abd iso x5 5" holds  Supine:   S/l:  Neuromuscular Re-education: Manual Therapy: Gait Training: Self Care: edema management with compression garments Trigger Point Dry Needling:  Modalities:      PATIENT EDUCATION:  Education details:on HEP, on importance of MD f/u with swelling, on donning compression garment when awake but to remove if painful, on elevation of legs if pain free Person educated: Patient Education method: Explanation, Demonstration, and Handouts Education comprehension: verbalized  understanding     HOME EXERCISE PROGRAM: [id number]   ASSESSMENT:  CLINICAL IMPRESSION: 07/17/2022 Re-certification performed on this date. Overall reduced ROM, increased pain and increased LE swelling noted bilaterally. Significant decline in function/mobility since last visit. Educated pt in MD f/u about pitting edema as arterial and venous insufficieny is suspected. Updated POC and goals to continue to work on mobility and pre operative strength. Patient would greatly benefit from skilled PT at this time.   Eval: Patient presents to physical therapy with complaints of right lower extremity pain that started last year a couple days after RSV vaccination.  Patient presents with muscle guarding, pain, range of motion and strength deficits in right lower extremity.  Discussed findings and plan for physical therapy moving forward.  Patient with desire to continue to run and remain active which pain is currently limiting his intensity of exercise at this time.  Patient would greatly benefit from skilled physical therapy to reduce risk of further injury and return patient to optimal function.   OBJECTIVE IMPAIRMENTS: decreased activity tolerance, decreased mobility, difficulty walking, decreased ROM, decreased strength, and pain.    ACTIVITY LIMITATIONS: standing, locomotion level, and running   PARTICIPATION LIMITATIONS: community activity, yard work, and recreational activity   PERSONAL FACTORS: Age are also affecting patient's functional outcome.    REHAB POTENTIAL: Good   CLINICAL DECISION MAKING: Evolving/moderately complex   EVALUATION COMPLEXITY: moderate     GOALS: Goals reviewed with patient? yes   SHORT TERM GOALS: Target date: 08/14/22 Patient will be independent in self management strategies to improve quality of life and functional outcomes. Baseline: New Program Goal status: PROGRESSING   2.  Patient will report at least 50% improvement in overall symptoms and/or function to  demonstrate improved functional mobility Baseline: 0% better Goal status NEW   3.  Patient will demonstrate pain-free MMT in bilateral lower extremities Baseline: Painful Goal status: NEW      * LONG TERM GOALS: Target date: 09/11/22    Patient will report at least 50% improvement in overall symptoms and/or function to demonstrate improved functional mobility Baseline: 0%  Goal status: NEW   2.  Patient will be able report performing HEP regularly to improve general outcomes for upcoming surgery. Goal status: NEW   3.  Patient report wearing compression garments regularly (if indicated) to help reduce swelling Baseline: not currently  Goal status: NEW     PLAN:   PT FREQUENCY: 1x/week   PT DURATION: 8 weeks   PLANNED INTERVENTIONS: Therapeutic exercises, Therapeutic activity, Neuromuscular re-education, Balance training, Gait training, Patient/Family education, Self Care, Joint mobilization, Joint manipulation, Stair training, Vestibular training, Orthotic/Fit training, DME instructions, Aquatic Therapy, Dry Needling, Electrical stimulation, Spinal manipulation, Spinal mobilization, Cryotherapy, Moist heat, Traction, Ultrasound, Ionotophoresis 4mg /ml Dexamethasone, Manual therapy, and Re-evaluation   PLAN FOR NEXT SESSION: isometrics, ROM, edema management -   2:24 PM, 07/17/22 Tereasa Coop, DPT Physical Therapy with [facility]

## 2022-07-16 ENCOUNTER — Telehealth: Payer: Self-pay | Admitting: Internal Medicine

## 2022-07-16 DIAGNOSIS — M25551 Pain in right hip: Secondary | ICD-10-CM | POA: Diagnosis not present

## 2022-07-16 DIAGNOSIS — I872 Venous insufficiency (chronic) (peripheral): Secondary | ICD-10-CM | POA: Diagnosis not present

## 2022-07-16 DIAGNOSIS — M87851 Other osteonecrosis, right femur: Secondary | ICD-10-CM | POA: Diagnosis not present

## 2022-07-16 DIAGNOSIS — M87852 Other osteonecrosis, left femur: Secondary | ICD-10-CM | POA: Diagnosis not present

## 2022-07-16 NOTE — Telephone Encounter (Signed)
Patient dropped off document Surgical Clearance, to be filled out by provider. Patient requested to send it back via Fax within 7-days. Document is located in providers tray at front office.Please advise at Home [phone]

## 2022-07-17 ENCOUNTER — Encounter: Payer: Self-pay | Admitting: Physical Therapy

## 2022-07-17 ENCOUNTER — Telehealth: Payer: Self-pay | Admitting: Internal Medicine

## 2022-07-17 ENCOUNTER — Ambulatory Visit: Payer: Medicare PPO | Admitting: Physical Therapy

## 2022-07-17 DIAGNOSIS — M6281 Muscle weakness (generalized): Secondary | ICD-10-CM | POA: Diagnosis not present

## 2022-07-17 DIAGNOSIS — R262 Difficulty in walking, not elsewhere classified: Secondary | ICD-10-CM

## 2022-07-17 DIAGNOSIS — M79604 Pain in right leg: Secondary | ICD-10-CM | POA: Diagnosis not present

## 2022-07-17 NOTE — Telephone Encounter (Signed)
Patient dropped off document Surgical Clearance, to be filled out by provider. Patient requested to send it back via Fax within 7-days. Document is located in providers tray at front office.Please advise at Mobile [phone] (mobile)  Please fax to: [phone]

## 2022-07-17 NOTE — Telephone Encounter (Signed)
Placed inside providers office box 

## 2022-07-17 NOTE — Telephone Encounter (Signed)
Placed inside Dr Crawfords office box 

## 2022-07-18 ENCOUNTER — Ambulatory Visit: Payer: Medicare PPO | Admitting: Family Medicine

## 2022-07-18 VITALS — BP 112/76 | HR 57 | Ht 69.0 in | Wt 140.0 lb

## 2022-07-18 DIAGNOSIS — R600 Localized edema: Secondary | ICD-10-CM

## 2022-07-18 LAB — BRAIN NATRIURETIC PEPTIDE: Pro B Natriuretic peptide (BNP): 196 pg/mL — ABNORMAL HIGH (ref 0.0–100.0)

## 2022-07-18 LAB — COMPREHENSIVE METABOLIC PANEL
ALT: 10 U/L (ref 0–53)
AST: 16 U/L (ref 0–37)
Albumin: 4 g/dL (ref 3.5–5.2)
Alkaline Phosphatase: 107 U/L (ref 39–117)
BUN: 30 mg/dL — ABNORMAL HIGH (ref 6–23)
CO2: 30 mEq/L (ref 19–32)
Calcium: 9.6 mg/dL (ref 8.4–10.5)
Chloride: 103 mEq/L (ref 96–112)
Creatinine, Ser: 1.18 mg/dL (ref 0.40–1.50)
GFR: 56.11 mL/min — ABNORMAL LOW (ref >60.00)
Glucose, Bld: 113 mg/dL — ABNORMAL HIGH (ref 70–99)
Potassium: 4.6 mEq/L (ref 3.5–5.1)
Sodium: 139 mEq/L (ref 135–145)
Total Bilirubin: 1.1 mg/dL (ref 0.2–1.2)
Total Protein: 6.7 g/dL (ref 6.0–8.3)

## 2022-07-18 NOTE — Progress Notes (Signed)
   Danny Payor, PhD, LAT, ATC acting as a scribe for Danny Graham, MD.  Danny Gross is a 88 y.o. male who presents to [facility] today for bilat LE swelling. Pt was previously seen by Dr. Denyse Amass on on 05/29/22 for bilat hip pain. Surgery is scheduled for 6 wks, R hip 1st.  Today, pt c/o bilat LE swelling x 1-2 months. No injury. Swelling is locates around both ankles and feet. He just got compression socks, but haven't started wearing them. He couldn't get in w/ PCP until Aug. No pain associated w/ the swelling.   He does have a prescription for 5 mg of amlodipine that he should be taking daily.  Previously he was only taking 2.5 mg of amlodipine.  Over the last few weeks he increased it to 5 mg which seems to coincide with the onset of leg swelling.  Treatments tried: none  Pertinent review of systems: No fevers or chills chest pain palpitation shortness of breath.  Relevant historical information: Aortic aneurysm and mild hypertension.   Exam:  BP 112/76   Pulse (!) 57   Ht 5\' 9"  (1.753 m)   Wt 140 lb (63.5 kg)   SpO2 97%   BMI 20.67 kg/m  General: Well Developed, well nourished, and in no acute distress.   MSK: Bilateral pedal edema feet and distal ankles.  Edema is approximately 1+ pitting.  Nontender.  Pulses intact.       Assessment and Plan: 88 y.o. male with bilateral lower extremity edema thought to be likely secondary to amlodipine.  Differential does include kidney failure heart failure venous reflux.  Will go ahead and check metabolic panel and BNP today.  Advised okay to use compression stockings.  Also advised stopping amlodipine for a week or 2 and checking blood pressures.  If his swelling goes away with the cessation of amlodipine we could try 2.5 mg daily or perhaps a different blood pressure regimen.  If this does not help and his labs and venous reflux study are normal would recommend echocardiogram He has a follow-up  appointment scheduled with his PCP in about a month.   PDMP not reviewed this encounter. Orders Placed This Encounter  Procedures   Comprehensive metabolic panel    Standing Status:   Future    Number of Occurrences:   1    Standing Expiration Date:   07/18/2023   B Nat Peptide   No orders of the defined types were placed in this encounter.    Discussed warning signs or symptoms. Please see discharge instructions. Patient expresses understanding.   The above documentation has been reviewed and is accurate and complete Danny Gross, M.D.

## 2022-07-18 NOTE — Patient Instructions (Addendum)
Thank you for coming in today.   STOP the amlodipine for a week or two.   Keep track of your blood pressure for the next 2 weeks and send the log to me and Dr Okey Dupre.   Get labs today.   You should hear about scheduling the venus reflux ultrasound soon.   Ok to use compression stockings. Lets see how you do off Amlodipine.  We may decide to just take 2.5 mg

## 2022-07-19 NOTE — Progress Notes (Signed)
Labs do indicate a degree of fluid overload.  We may consider echocardiogram to look at your heart in the future.  Lets proceed with the plan as we developed yesterday.

## 2022-07-23 NOTE — Telephone Encounter (Signed)
Pt has been scheduled.  °

## 2022-07-23 NOTE — Telephone Encounter (Signed)
Needs visit with EKG with any provider for clearance

## 2022-07-24 ENCOUNTER — Encounter: Payer: Self-pay | Admitting: Family Medicine

## 2022-07-24 DIAGNOSIS — G8929 Other chronic pain: Secondary | ICD-10-CM

## 2022-07-24 DIAGNOSIS — M1611 Unilateral primary osteoarthritis, right hip: Secondary | ICD-10-CM

## 2022-07-31 ENCOUNTER — Encounter: Payer: Self-pay | Admitting: Physical Therapy

## 2022-07-31 ENCOUNTER — Ambulatory Visit: Payer: Medicare PPO | Admitting: Physical Therapy

## 2022-07-31 DIAGNOSIS — M79604 Pain in right leg: Secondary | ICD-10-CM

## 2022-07-31 DIAGNOSIS — R262 Difficulty in walking, not elsewhere classified: Secondary | ICD-10-CM | POA: Diagnosis not present

## 2022-07-31 DIAGNOSIS — M6281 Muscle weakness (generalized): Secondary | ICD-10-CM

## 2022-07-31 NOTE — Therapy (Signed)
OUTPATIENT PHYSICAL THERAPY TREATMENT NOTE, RECERT      Patient Name: Danny Gross MRN: [id number] DOB:12-Nov-1934, 88 y.o., male Today's Date: 07/31/2022  PCP: Hillard Danker, Mervyn Skeeters MD   REFERRING PROVIDER: Rodolph Bong, MD  END OF SESSION:   PT End of Session - 07/31/22 1349     Visit Number 11    Number of Visits 18    Date for PT Re-Evaluation 09/11/22    Authorization Type humana -12 visits from 03/21/2022 - 05/02/2022,8 visits 07/17/2022 - 10/08/2022    Authorization - Visit Number 2    Authorization - Number of Visits 8    PT Start Time 1350    PT Stop Time 1428    PT Time Calculation (min) 38 min    Activity Tolerance Patient limited by pain    Behavior During Therapy [facility] for tasks assessed/performed               Past Medical History:  Diagnosis Date   Cataract    Emphysema of lung (HCC)    Past Surgical History:  Procedure Laterality Date   HERNIA REPAIR  2003   TONSILLECTOMY  1939   VIDEO BRONCHOSCOPY Bilateral 08/11/2013   Procedure: VIDEO BRONCHOSCOPY WITH FLUORO;  Surgeon: Nyoka Cowden, MD;  Location: [facility];  Service: Cardiopulmonary;  Laterality: Bilateral;   Patient Active Problem List   Diagnosis Date Noted   Bilateral hip joint arthritis 03/13/2022   Myalgia 11/28/2021   Thoracic aortic aneurysm (HCC) 06/07/2021   Aortic atherosclerosis (HCC) 07/19/2020   Bronchiectasis without complication (HCC) 04/22/2018   Impaired fasting glucose 10/04/2014   Routine general medical examination at a health care facility 12/21/2013   Pulmonary infiltrates 07/13/2013     THERAPY DIAG:  Muscle weakness (generalized)  Difficulty in walking, not elsewhere classified  Pain in right leg    REFERRING DIAG:  M25.551,G89.29 (ICD-10-CM) - Chronic pain of right hip  M87.051 (ICD-10-CM) - Avascular necrosis of bone of right hip (HCC)  M16.0 (ICD-10-CM) - Bilateral hip joint arthritis      Rationale for Evaluation and Treatment:  Rehabilitation   ONSET DATE: 8 months ago   SUBJECTIVE:    SUBJECTIVE STATEMENT: 07/31/2022 States that he has been in pain. His swelling is better. He has modified his BP medication and that helped.       09/24/21 he got his first RSV shot and the next couple days he started getting leg cramping and pain and the pain has not let up. States that he has run for 70 years and never has had any problems. States his right knee buckles on occasion.  States he has cut down his running 3x/week and some sprinting. States he as running at a higher intensity.  Has tried prednisone         PERTINENT HISTORY: Lung emphysema PAIN:  Are you having pain? no: NPRS scale: 3/10 Pain location:  right thigh/hip  Pain description: aching, intense grabbing Aggravating factors: walking, standing, , getting in and out of car Relieving factors: rest, positioning   PRECAUTIONS: None   WEIGHT BEARING RESTRICTIONS: No   FALLS:  Has patient fallen in last 6 months? No     OCCUPATION: retired, singing, gardening, running,    PLOF: Independent   PATIENT GOALS: to have less pain      OBJECTIVE:    DIAGNOSTIC FINDINGS: 03/08/22 Pelvis xray IMPRESSION: 1. Bilateral hip degenerative changes, greater on the right. 2. Lower lumbar spine degenerative changes.   Right  knee xray FINDINGS: Mild-to-moderate medial joint space narrowing. Mild spur formation involving all 3 joint compartments. The lateral view is slightly oblique with no gross effusion seen. Atheromatous arterial calcifications.   IMPRESSION: Mild tricompartmental degenerative changes, as described above.   Left MRI 05/18/22   IMPRESSION: 1. Significant/advanced degenerative changes involving the right hip. Sizable focus of AVN involving the right femoral head with some surrounding marrow edema and associated moderate to large joint effusion. 2. Moderate degenerative changes involving the left hip. 3. The superior anterior  labrum of the left hip is degenerated and torn. 4. Mild bilateral peritrochanteric tendinosis but no trochanteric bursitis. 5. Mild edema like signal changes in the pectineus and obturator externus muscles suggesting muscle strains.        LE Measurements       Lower Extremity Right 07/17/22 Left 07/17/22    A/PROM MMT A/PROM MMT  Hip Flexion WFL* 4-* WFL* 4  Hip Extension 5 ( lacking) 2+* 5 (lacking) 3*  Hip Abduction          Hip Adduction          Hip Internal rotation 10*   45    Hip External rotation 30*   20*    Knee Flexion 125 4 125 4  Knee Extension 0 4 0 4  Ankle Dorsiflexion   4+   4+  Ankle Plantarflexion          Ankle Inversion          Ankle Eversion           (Blank rows = not tested) * pain-twinges    Bed mobilities: slow labored movements - no increase in pain.    Special tests/Observations: Leg elevation test: pale left foot no pain  Leg drop test- redness returned > 60 seconds after elevation on L  B pitting edema 4+, rubor noted with feet dependent position B   TODAY'S TREATMENT:                                                                                                                              DATE:  07/31/2022   Therapeutic Exercise: Seated:  Supine:  hip add iso 2 minutes, hip abd bent knee fall outs 2 x2 minutes, review of HEP S/l:  Bike: L1 8 minutes  Neuromuscular Re-education: Manual Therapy: Gait Training: Self Care:  on requirements for upcoming surgery- MD f/u with EKG, on different types of surgeries and their indications as well as post op rehab. Trigger Point Dry Needling:  Modalities:      PATIENT EDUCATION:  Education details:on HEP, on MD f/u for EKG, on different types of hip surgeries and indications for different types  Person educated: Patient Education method: Explanation, Demonstration, and Handouts Education comprehension: verbalized understanding     HOME EXERCISE PROGRAM: [id number]   ASSESSMENT:    CLINICAL IMPRESSION: 07/31/2022 Session focused on education, review of HEP and discussion of importance of continued  mobility with bike and gentle ROM exercises. Educated in post op rehab and different types of surgery. Overall improved ROM noted end of session. Will continue with current POC as tolerated.    Eval: Patient presents to physical therapy with complaints of right lower extremity pain that started last year a couple days after RSV vaccination.  Patient presents with muscle guarding, pain, range of motion and strength deficits in right lower extremity.  Discussed findings and plan for physical therapy moving forward.  Patient with desire to continue to run and remain active which pain is currently limiting his intensity of exercise at this time.  Patient would greatly benefit from skilled physical therapy to reduce risk of further injury and return patient to optimal function.   OBJECTIVE IMPAIRMENTS: decreased activity tolerance, decreased mobility, difficulty walking, decreased ROM, decreased strength, and pain.    ACTIVITY LIMITATIONS: standing, locomotion level, and running   PARTICIPATION LIMITATIONS: community activity, yard work, and recreational activity   PERSONAL FACTORS: Age are also affecting patient's functional outcome.    REHAB POTENTIAL: Good   CLINICAL DECISION MAKING: Evolving/moderately complex   EVALUATION COMPLEXITY: moderate     GOALS: Goals reviewed with patient? yes   SHORT TERM GOALS: Target date: 08/14/22 Patient will be independent in self management strategies to improve quality of life and functional outcomes. Baseline: New Program Goal status: PROGRESSING   2.  Patient will report at least 50% improvement in overall symptoms and/or function to demonstrate improved functional mobility Baseline: 0% better Goal status NEW   3.  Patient will demonstrate pain-free MMT in bilateral lower extremities Baseline: Painful Goal status: NEW       * LONG TERM GOALS: Target date: 09/11/22    Patient will report at least 50% improvement in overall symptoms and/or function to demonstrate improved functional mobility Baseline: 0%  Goal status: NEW   2.  Patient will be able report performing HEP regularly to improve general outcomes for upcoming surgery. Goal status: NEW   3.  Patient report wearing compression garments regularly (if indicated) to help reduce swelling Baseline: not currently  Goal status: NEW     PLAN:   PT FREQUENCY: 1x/week   PT DURATION: 8 weeks   PLANNED INTERVENTIONS: Therapeutic exercises, Therapeutic activity, Neuromuscular re-education, Balance training, Gait training, Patient/Family education, Self Care, Joint mobilization, Joint manipulation, Stair training, Vestibular training, Orthotic/Fit training, DME instructions, Aquatic Therapy, Dry Needling, Electrical stimulation, Spinal manipulation, Spinal mobilization, Cryotherapy, Moist heat, Traction, Ultrasound, Ionotophoresis 4mg /ml Dexamethasone, Manual therapy, and Re-evaluation   PLAN FOR NEXT SESSION: isometrics, ROM, edema management -   3:15 PM, 07/31/22 Tereasa Coop, DPT Physical Therapy with Dolores Lory

## 2022-08-02 ENCOUNTER — Ambulatory Visit (HOSPITAL_COMMUNITY)
Admission: RE | Admit: 2022-08-02 | Discharge: 2022-08-02 | Disposition: A | Discharge status: Home / Self Care | Payer: Medicare PPO | Source: Ambulatory Visit | Attending: Family Medicine | Admitting: Family Medicine

## 2022-08-02 DIAGNOSIS — R600 Localized edema: Secondary | ICD-10-CM | POA: Diagnosis not present

## 2022-08-03 NOTE — Progress Notes (Signed)
Vascular ultrasound just showed that the valves in the veins in your legs are not working as well as that showed which can cause the blood to backup in your legs which can cause leg swelling.  Compression stockings like we talked about will be helpful.  Please keep your follow-up appointment with your primary care provider next month.

## 2022-08-06 NOTE — Telephone Encounter (Signed)
As far as I am aware, PCP or cardiology typically does EKG for surgical clearance.   Dr. Denyse Amass, is this something that you would feel comfortable doing or should pt f/u with Ortho, PCP, or Cardiologist?

## 2022-08-07 ENCOUNTER — Ambulatory Visit: Payer: Medicare PPO | Admitting: Physical Therapy

## 2022-08-07 ENCOUNTER — Encounter: Payer: Self-pay | Admitting: Physical Therapy

## 2022-08-07 DIAGNOSIS — M6281 Muscle weakness (generalized): Secondary | ICD-10-CM | POA: Diagnosis not present

## 2022-08-07 DIAGNOSIS — R262 Difficulty in walking, not elsewhere classified: Secondary | ICD-10-CM | POA: Diagnosis not present

## 2022-08-07 DIAGNOSIS — M79604 Pain in right leg: Secondary | ICD-10-CM

## 2022-08-07 NOTE — Therapy (Signed)
OUTPATIENT PHYSICAL THERAPY TREATMENT NOTE, RECERT      Patient Name: Danny Gross MRN: [id number] DOB:Aug 09, 1934, 88 y.o., male Today's Date: 08/07/2022  PCP: Hillard Danker, Mervyn Skeeters MD   REFERRING PROVIDER: Rodolph Bong, MD  END OF SESSION:   PT End of Session - 08/07/22 1209     Visit Number 12    Number of Visits 18    Date for PT Re-Evaluation 09/11/22    Authorization Type humana -12 visits from 03/21/2022 - 05/02/2022,8 visits 07/17/2022 - 10/08/2022    Authorization - Visit Number 3    Authorization - Number of Visits 8    PT Start Time 1215    PT Stop Time 1255    PT Time Calculation (min) 40 min    Activity Tolerance Patient limited by pain    Behavior During Therapy [facility] for tasks assessed/performed               Past Medical History:  Diagnosis Date   Cataract    Emphysema of lung [facility])    Past Surgical History:  Procedure Laterality Date   HERNIA REPAIR  2003   TONSILLECTOMY  1939   VIDEO BRONCHOSCOPY Bilateral 08/11/2013   Procedure: VIDEO BRONCHOSCOPY WITH FLUORO;  Surgeon: Nyoka Cowden, MD;  Location: [facility];  Service: Cardiopulmonary;  Laterality: Bilateral;   Patient Active Problem List   Diagnosis Date Noted   Bilateral hip joint arthritis 03/13/2022   Myalgia 11/28/2021   Thoracic aortic aneurysm (HCC) 06/07/2021   Aortic atherosclerosis (HCC) 07/19/2020   Bronchiectasis without complication (HCC) 04/22/2018   Impaired fasting glucose 10/04/2014   Routine general medical examination at a health care facility 12/21/2013   Pulmonary infiltrates 07/13/2013     THERAPY DIAG:  Muscle weakness (generalized)  Difficulty in walking, not elsewhere classified  Pain in right leg    REFERRING DIAG:  M25.551,G89.29 (ICD-10-CM) - Chronic pain of right hip  M87.051 (ICD-10-CM) - Avascular necrosis of bone of right hip (HCC)  M16.0 (ICD-10-CM) - Bilateral hip joint arthritis      Rationale for Evaluation and Treatment:  Rehabilitation   ONSET DATE: 8 months ago   SUBJECTIVE:    SUBJECTIVE STATEMENT: 08/07/2022 Pain is the same, still waiting on surgical clearance. Getting into the passenger side of the car is not pleasant.   09/24/21 he got his first RSV shot and the next couple days he started getting leg cramping and pain and the pain has not let up. States that he has run for 70 years and never has had any problems. States his right knee buckles on occasion.  States he has cut down his running 3x/week and some sprinting. States he as running at a higher intensity.  Has tried prednisone         PERTINENT HISTORY: Lung emphysema PAIN:  Are you having pain? no: NPRS scale: 2/10 Pain location:  right thigh/hip  Pain description: aching, intense grabbing Aggravating factors: walking, standing, , getting in and out of car Relieving factors: rest, positioning   PRECAUTIONS: None   WEIGHT BEARING RESTRICTIONS: No   FALLS:  Has patient fallen in last 6 months? No     OCCUPATION: retired, singing, gardening, running,    PLOF: Independent   PATIENT GOALS: to have less pain      OBJECTIVE:    DIAGNOSTIC FINDINGS: 03/08/22 Pelvis xray IMPRESSION: 1. Bilateral hip degenerative changes, greater on the right. 2. Lower lumbar spine degenerative changes.   Right knee xray FINDINGS: Mild-to-moderate  medial joint space narrowing. Mild spur formation involving all 3 joint compartments. The lateral view is slightly oblique with no gross effusion seen. Atheromatous arterial calcifications.   IMPRESSION: Mild tricompartmental degenerative changes, as described above.   Left MRI 05/18/22   IMPRESSION: 1. Significant/advanced degenerative changes involving the right hip. Sizable focus of AVN involving the right femoral head with some surrounding marrow edema and associated moderate to large joint effusion. 2. Moderate degenerative changes involving the left hip. 3. The superior anterior  labrum of the left hip is degenerated and torn. 4. Mild bilateral peritrochanteric tendinosis but no trochanteric bursitis. 5. Mild edema like signal changes in the pectineus and obturator externus muscles suggesting muscle strains.        LE Measurements       Lower Extremity Right 07/17/22 Left 07/17/22    A/PROM MMT A/PROM MMT  Hip Flexion WFL* 4-* WFL* 4  Hip Extension 5 ( lacking) 2+* 5 (lacking) 3*  Hip Abduction          Hip Adduction          Hip Internal rotation 10*   45    Hip External rotation 30*   20*    Knee Flexion 125 4 125 4  Knee Extension 0 4 0 4  Ankle Dorsiflexion   4+   4+  Ankle Plantarflexion          Ankle Inversion          Ankle Eversion           (Blank rows = not tested) * pain-twinges    Bed mobilities: slow labored movements - no increase in pain.    Special tests/Observations: Leg elevation test: pale left foot no pain  Leg drop test- redness returned > 60 seconds after elevation on L  B pitting edema 4+, rubor noted with feet dependent position B   TODAY'S TREATMENT:                                                                                                                              DATE:  08/07/2022   Therapeutic Exercise: Seated:  Supine:  hip add iso 2 minutes, hip abd bent knee fall outs and ins B 2 minutes, hip flexion with towel R x2  1.5 minutes, review of HEP S/l:  Bike:   Neuromuscular Re-education: Manual Therapy: STM to right hip with percussion gun and towel - tolerated well - reduced pain afterwards - 15 minutes Gait Training: Self Care:   Therapeutic Activity: practiced getting in mock passenger side of car with smaller steps 5 minutes Modalities:      PATIENT EDUCATION:  Education details:on HEP, on taking smaller steps with getting into the car. - Person educated: Patient Education method: Explanation, Demonstration, and Handouts Education comprehension: verbalized understanding     HOME EXERCISE  PROGRAM: [id number]   ASSESSMENT:   CLINICAL IMPRESSION: 08/07/2022 Continued with ROM and muscle activation tolerated moderately  well. Fatigue noted. Continued pain in hip flexor, this reduced with STM with percussion gun. Educated patient in safe use of percussion gun with towel as buffer and never use if painful. Discussed and practiced improved way to get into car with less pain. Will continue with current POC.    Eval: Patient presents to physical therapy with complaints of right lower extremity pain that started last year a couple days after RSV vaccination.  Patient presents with muscle guarding, pain, range of motion and strength deficits in right lower extremity.  Discussed findings and plan for physical therapy moving forward.  Patient with desire to continue to run and remain active which pain is currently limiting his intensity of exercise at this time.  Patient would greatly benefit from skilled physical therapy to reduce risk of further injury and return patient to optimal function.   OBJECTIVE IMPAIRMENTS: decreased activity tolerance, decreased mobility, difficulty walking, decreased ROM, decreased strength, and pain.    ACTIVITY LIMITATIONS: standing, locomotion level, and running   PARTICIPATION LIMITATIONS: community activity, yard work, and recreational activity   PERSONAL FACTORS: Age are also affecting patient's functional outcome.    REHAB POTENTIAL: Good   CLINICAL DECISION MAKING: Evolving/moderately complex   EVALUATION COMPLEXITY: moderate     GOALS: Goals reviewed with patient? yes   SHORT TERM GOALS: Target date: 08/14/22 Patient will be independent in self management strategies to improve quality of life and functional outcomes. Baseline: New Program Goal status: PROGRESSING   2.  Patient will report at least 50% improvement in overall symptoms and/or function to demonstrate improved functional mobility Baseline: 0% better Goal status NEW   3.  Patient  will demonstrate pain-free MMT in bilateral lower extremities Baseline: Painful Goal status: NEW      * LONG TERM GOALS: Target date: 09/11/22    Patient will report at least 50% improvement in overall symptoms and/or function to demonstrate improved functional mobility Baseline: 0%  Goal status: NEW   2.  Patient will be able report performing HEP regularly to improve general outcomes for upcoming surgery. Goal status: NEW   3.  Patient report wearing compression garments regularly (if indicated) to help reduce swelling Baseline: not currently  Goal status: NEW     PLAN:   PT FREQUENCY: 1x/week   PT DURATION: 8 weeks   PLANNED INTERVENTIONS: Therapeutic exercises, Therapeutic activity, Neuromuscular re-education, Balance training, Gait training, Patient/Family education, Self Care, Joint mobilization, Joint manipulation, Stair training, Vestibular training, Orthotic/Fit training, DME instructions, Aquatic Therapy, Dry Needling, Electrical stimulation, Spinal manipulation, Spinal mobilization, Cryotherapy, Moist heat, Traction, Ultrasound, Ionotophoresis 4mg /ml Dexamethasone, Manual therapy, and Re-evaluation   PLAN FOR NEXT SESSION: isometrics, ROM, edema management -   1:01 PM, 08/07/22 Tereasa Coop, DPT Physical Therapy with [facility]

## 2022-08-08 ENCOUNTER — Encounter: Payer: Self-pay | Admitting: Orthopaedic Surgery

## 2022-08-08 ENCOUNTER — Ambulatory Visit: Payer: Medicare PPO | Admitting: Orthopaedic Surgery

## 2022-08-08 DIAGNOSIS — M1611 Unilateral primary osteoarthritis, right hip: Secondary | ICD-10-CM

## 2022-08-08 NOTE — Progress Notes (Signed)
Office Visit Note   Patient: Danny Gross           Date of Birth: [birth year]           MRN: [id number] Visit Date: 08/08/2022              Requested by: Danny Bong, MD [address] [place],  [place] [address] PCP: Danny Broker, MD   Assessment & Plan: Visit Diagnoses:  1. Primary osteoarthritis of right hip     Plan: Impression is severe right hip degenerative joint disease secondary to Osteoarthritis.  Imaging shows bone on bone joint space narrowing.  At this point, conservative treatments fail to provide any significant relief and the pain is severely affecting ADLs and quality of life.  Based on treatment options, the patient has elected to move forward with a hip replacement.  We have discussed the surgical risks that include but are not limited to infection, DVT, leg length discrepancy, numbness, tingling, incomplete relief of pain.  Recovery and prognosis were also reviewed.    Danny Gross will call the patient to confirm surgery date.  Anticipate that the patient may need 1-2 night stay in the hospital postoperatively.  Current anticoagulants: No antithrombotic Postop anticoagulation: Aspirin 81 mg Diabetic: No  Prior DVT/PE: No Tobacco use: No Clearances needed for surgery: Danny Gross - PCP Anticipate discharge dispo: home   Follow-Up Instructions: No follow-ups on file.   Orders:  No orders of the defined types were placed in this encounter.  No orders of the defined types were placed in this encounter.     Procedures: No procedures performed   Clinical Data: No additional findings.   Subjective: Chief Complaint  Patient presents with   Right Hip - Pain    HPI Danny Gross is a very pleasant 88 year old gentleman here for evaluation of chronic right hip pain due to advanced DJD.  He has been seeing Danny Gross for conservative management.  Recently he has felt a significant worsening in his symptoms.  He is very limited with ADLs.  At  baseline he was very active individual.  He lives with his wife.  He is having pain at night.  He has a strong family history of hip replacements. Review of Systems  Constitutional: Negative.   HENT: Negative.    Eyes: Negative.   Respiratory: Negative.    Cardiovascular: Negative.   Gastrointestinal: Negative.   Endocrine: Negative.   Genitourinary: Negative.   Skin: Negative.   Allergic/Immunologic: Negative.   Neurological: Negative.   Hematological: Negative.   Psychiatric/Behavioral: Negative.    All other systems reviewed and are negative.    Objective: Vital Signs: There were no vitals taken for this visit.  Physical Exam Vitals and nursing note reviewed.  Constitutional:      Appearance: He is well-developed.  HENT:     Head: Normocephalic and atraumatic.  Eyes:     Pupils: Pupils are equal, round, and reactive to light.  Pulmonary:     Effort: Pulmonary effort is normal.  Abdominal:     Palpations: Abdomen is soft.  Musculoskeletal:        General: Normal range of motion.     Cervical back: Neck supple.  Skin:    General: Skin is warm.  Neurological:     Mental Status: He is alert and oriented to person, place, and time.  Psychiatric:        Behavior: Behavior normal.        Thought  Content: Thought content normal.        Judgment: Judgment normal.     Ortho Exam Examination right hip shows significant pain with Stinchfield and FADIR.  Antalgic gait.  No trochanteric tenderness. Specialty Comments:  No specialty comments available.  Imaging: No results found.   PMFS History: Patient Active Problem List   Diagnosis Date Noted   Bilateral hip joint arthritis 03/13/2022   Myalgia 11/28/2021   Thoracic aortic aneurysm (HCC) 06/07/2021   Aortic atherosclerosis (HCC) 07/19/2020   Bronchiectasis without complication (HCC) 04/22/2018   Impaired fasting glucose 10/04/2014   Routine general medical examination at a health care facility 12/21/2013    Pulmonary infiltrates 07/13/2013   Past Medical History:  Diagnosis Date   Cataract    Emphysema of lung (HCC)     Family History  Problem Relation Age of Onset   Cancer Maternal Aunt        ? type     Past Surgical History:  Procedure Laterality Date   HERNIA REPAIR  2003   TONSILLECTOMY  1939   VIDEO BRONCHOSCOPY Bilateral 08/11/2013   Procedure: VIDEO BRONCHOSCOPY WITH FLUORO;  Surgeon: Danny Cowden, MD;  Location: [facility];  Service: Cardiopulmonary;  Laterality: Bilateral;   Social History   Occupational History   Occupation: Retired  Tobacco Use   Smoking status: Never   Smokeless tobacco: Never  Vaping Use   Vaping status: Never Used  Substance and Sexual Activity   Alcohol use: Yes    Alcohol/week: 0.0 standard drinks of alcohol    Comment: cocktail daily   Drug use: No   Sexual activity: Not Currently

## 2022-08-13 ENCOUNTER — Other Ambulatory Visit: Payer: Self-pay

## 2022-08-14 ENCOUNTER — Ambulatory Visit: Payer: Medicare PPO | Admitting: Physical Therapy

## 2022-08-14 ENCOUNTER — Ambulatory Visit: Payer: Medicare PPO | Admitting: Internal Medicine

## 2022-08-14 ENCOUNTER — Encounter: Payer: Self-pay | Admitting: Physical Therapy

## 2022-08-14 ENCOUNTER — Encounter: Payer: Self-pay | Admitting: Internal Medicine

## 2022-08-14 VITALS — BP 160/80 | HR 53 | Temp 97.9°F | Ht 69.0 in | Wt 141.0 lb

## 2022-08-14 DIAGNOSIS — J479 Bronchiectasis, uncomplicated: Secondary | ICD-10-CM

## 2022-08-14 DIAGNOSIS — I10 Essential (primary) hypertension: Secondary | ICD-10-CM | POA: Insufficient documentation

## 2022-08-14 DIAGNOSIS — M79604 Pain in right leg: Secondary | ICD-10-CM

## 2022-08-14 DIAGNOSIS — M6281 Muscle weakness (generalized): Secondary | ICD-10-CM

## 2022-08-14 DIAGNOSIS — R262 Difficulty in walking, not elsewhere classified: Secondary | ICD-10-CM | POA: Diagnosis not present

## 2022-08-14 DIAGNOSIS — I712 Thoracic aortic aneurysm, without rupture, unspecified: Secondary | ICD-10-CM | POA: Diagnosis not present

## 2022-08-14 DIAGNOSIS — Z01818 Encounter for other preprocedural examination: Secondary | ICD-10-CM | POA: Insufficient documentation

## 2022-08-14 MED ORDER — AMLODIPINE BESYLATE 2.5 MG PO TABS: 2.5000 mg | ORAL_TABLET | Freq: Every day | ORAL | 3 refills | Status: DC

## 2022-08-14 NOTE — Assessment & Plan Note (Signed)
Recently amlodipine 5 mg daily stopped due to some dizziness at another provider office. Due to enlarging thoracic aneurysm he needs tight control of BP to <130/80 and BP is elevated today. Restart amlodipine 2,5 mg daily. Also overdue for aneurysm monitoring I see CT surg ordered this in April but never scheduled. He will need this prior to pre-op clearance. If this has enlarged this would be priority over non-urgent surgery.

## 2022-08-14 NOTE — Assessment & Plan Note (Signed)
No change and no further screening needed prior to upcoming surgery.

## 2022-08-14 NOTE — Progress Notes (Signed)
   Subjective:   Patient ID: Danny Gross, male    DOB: [birth year], 88 y.o.   MRN: [id number]  HPI The patient is an 88 YO man coming in for surgical clearance also with concerns about arterial and venous flow in legs and swelling. No recent follow up of enlarging thoracic aortic aneurysm  Review of Systems  Constitutional: Negative.   HENT: Negative.    Eyes: Negative.   Respiratory:  Negative for cough, chest tightness and shortness of breath.   Cardiovascular:  Negative for chest pain, palpitations and leg swelling.  Gastrointestinal:  Negative for abdominal distention, abdominal pain, constipation, diarrhea, nausea and vomiting.  Musculoskeletal: Negative.   Skin: Negative.   Neurological: Negative.   Psychiatric/Behavioral: Negative.      Objective:  Physical Exam Constitutional:      Appearance: He is well-developed.  HENT:     Head: Normocephalic and atraumatic.  Cardiovascular:     Rate and Rhythm: Normal rate and regular rhythm.  Pulmonary:     Effort: Pulmonary effort is normal. No respiratory distress.     Breath sounds: Normal breath sounds. No wheezing or rales.  Abdominal:     General: Bowel sounds are normal. There is no distension.     Palpations: Abdomen is soft.     Tenderness: There is no abdominal tenderness. There is no rebound.  Musculoskeletal:     Cervical back: Normal range of motion.  Skin:    General: Skin is warm and dry.  Neurological:     Mental Status: He is alert and oriented to person, place, and time.     Coordination: Coordination normal.     Vitals:   08/14/22 0918 08/14/22 0921  BP: (!) 180/80 (!) 180/80  Pulse: (!) 53   Temp: 97.9 F (36.6 C)   TempSrc: Oral   SpO2: 98%   Weight: 141 lb (64 kg)   Height: 5\' 9"  (1.753 m)    EKG: Rate 47, axis normal, interval normal, sinus brady, likely left atrial enlargement, no st or t wave changes, no prior on file   Assessment & Plan:

## 2022-08-14 NOTE — Patient Instructions (Signed)
We will check the CT scan of the chest to check on the aneurysm.  We will restart the amlodipine at 2.5 mg daily to help blood pressure stay stable.

## 2022-08-14 NOTE — Assessment & Plan Note (Signed)
BP uncontrolled today as another provider did stop his amlodipine. Patient reports he was having low BP and dizziness prior to this being stopped. Restart amlodipine 2.5 mg daily to help with goal BP <130/80 given enlarging thoracic aneurysm.

## 2022-08-14 NOTE — Assessment & Plan Note (Signed)
EKG done and stable. No current chest pain. I would wait until we have CT chest to check on size of aneurysm. If this is growing and has met criteria to fix I would prioritize this over his non-emergent joint replacement surgery. Also will want BP at goal prior to surgery restarting medication today.

## 2022-08-14 NOTE — Therapy (Signed)
OUTPATIENT PHYSICAL THERAPY TREATMENT NOTE, RECERT      Patient Name: Danny Gross MRN: [id number] DOB:1934/07/20, 88 y.o., male Today's Date: 08/14/2022  PCP: Hillard Danker, Mervyn Skeeters MD   REFERRING PROVIDER: Rodolph Bong, MD  END OF SESSION:   PT End of Session - 08/14/22 1350     Visit Number 13    Number of Visits 18    Date for PT Re-Evaluation 09/11/22    Authorization Type humana -12 visits from 03/21/2022 - 05/02/2022,8 visits 07/17/2022 - 10/08/2022    Authorization - Visit Number 4    Authorization - Number of Visits 8    PT Start Time 1350    PT Stop Time 1428    PT Time Calculation (min) 38 min    Activity Tolerance Patient limited by pain    Behavior During Therapy [facility] for tasks assessed/performed               Past Medical History:  Diagnosis Date   Cataract    Emphysema of lung (HCC)    Past Surgical History:  Procedure Laterality Date   HERNIA REPAIR  2003   TONSILLECTOMY  1939   VIDEO BRONCHOSCOPY Bilateral 08/11/2013   Procedure: VIDEO BRONCHOSCOPY WITH FLUORO;  Surgeon: Nyoka Cowden, MD;  Location: [facility];  Service: Cardiopulmonary;  Laterality: Bilateral;   Patient Active Problem List   Diagnosis Date Noted   Essential hypertension 08/14/2022   Pre-op examination 08/14/2022   Bilateral hip joint arthritis 03/13/2022   Myalgia 11/28/2021   Thoracic aortic aneurysm (HCC) 06/07/2021   Aortic atherosclerosis (HCC) 07/19/2020   Bronchiectasis without complication (HCC) 04/22/2018   Impaired fasting glucose 10/04/2014   Routine general medical examination at a health care facility 12/21/2013   Pulmonary infiltrates 07/13/2013     THERAPY DIAG:  Muscle weakness (generalized)  Difficulty in walking, not elsewhere classified  Pain in right leg    REFERRING DIAG:  M25.551,G89.29 (ICD-10-CM) - Chronic pain of right hip  M87.051 (ICD-10-CM) - Avascular necrosis of bone of right hip (HCC)  M16.0 (ICD-10-CM) - Bilateral hip joint  arthritis      Rationale for Evaluation and Treatment: Rehabilitation   ONSET DATE: 8 months ago   SUBJECTIVE:    SUBJECTIVE STATEMENT: 08/14/2022 Pain is the same, states that he has a surgery date 8/29.   09/24/21 he got his first RSV shot and the next couple days he started getting leg cramping and pain and the pain has not let up. States that he has run for 70 years and never has had any problems. States his right knee buckles on occasion.  States he has cut down his running 3x/week and some sprinting. States he as running at a higher intensity.  Has tried prednisone         PERTINENT HISTORY: Lung emphysema PAIN:  Are you having pain? no: NPRS scale: 3/10 Pain location:  right thigh/hip  Pain description: aching, intense grabbing Aggravating factors: walking, standing, , getting in and out of car Relieving factors: rest, positioning   PRECAUTIONS: None   WEIGHT BEARING RESTRICTIONS: No   FALLS:  Has patient fallen in last 6 months? No     OCCUPATION: retired, singing, gardening, running,    PLOF: Independent   PATIENT GOALS: to have less pain      OBJECTIVE:    DIAGNOSTIC FINDINGS: 03/08/22 Pelvis xray IMPRESSION: 1. Bilateral hip degenerative changes, greater on the right. 2. Lower lumbar spine degenerative changes.   Right knee xray  FINDINGS: Mild-to-moderate medial joint space narrowing. Mild spur formation involving all 3 joint compartments. The lateral view is slightly oblique with no gross effusion seen. Atheromatous arterial calcifications.   IMPRESSION: Mild tricompartmental degenerative changes, as described above.   Left MRI 05/18/22   IMPRESSION: 1. Significant/advanced degenerative changes involving the right hip. Sizable focus of AVN involving the right femoral head with some surrounding marrow edema and associated moderate to large joint effusion. 2. Moderate degenerative changes involving the left hip. 3. The superior anterior  labrum of the left hip is degenerated and torn. 4. Mild bilateral peritrochanteric tendinosis but no trochanteric bursitis. 5. Mild edema like signal changes in the pectineus and obturator externus muscles suggesting muscle strains.        LE Measurements       Lower Extremity Right 07/17/22 Left 07/17/22    A/PROM MMT A/PROM MMT  Hip Flexion WFL* 4-* WFL* 4  Hip Extension 5 ( lacking) 2+* 5 (lacking) 3*  Hip Abduction          Hip Adduction          Hip Internal rotation 10*   45    Hip External rotation 30*   20*    Knee Flexion 125 4 125 4  Knee Extension 0 4 0 4  Ankle Dorsiflexion   4+   4+  Ankle Plantarflexion          Ankle Inversion          Ankle Eversion           (Blank rows = not tested) * pain-twinges    Bed mobilities: slow labored movements - no increase in pain.    Special tests/Observations: Leg elevation test: pale left foot no pain  Leg drop test- redness returned > 60 seconds after elevation on L  B pitting edema 4+, rubor noted with feet dependent position B   TODAY'S TREATMENT:                                                                                                                              DATE:  08/14/2022   Therapeutic Exercise: Seated:  Supine:  hip add iso 2 minutes, hip abd bent knee fall outs and ins B 2 minutes,   review of HEP, PROM of right hip in all directions - 8 minutes, bridges x10 S/l:  Bike:   Neuromuscular Re-education: Manual Therapy: STM to right hip with percussion gun and towel - tolerated well - reduced pain afterwards - 15 minutes Gait Training: Self Care:   Therapeutic Activity:  Modalities:      PATIENT EDUCATION:  Education details:on HEP, on following up with BP measurements throughout the day, on upcoming surgery and answerting all questions concerning it.  Person educated: Patient Education method: Explanation, Demonstration, and Handouts Education comprehension: verbalized understanding     HOME  EXERCISE PROGRAM: [id number]   ASSESSMENT:   CLINICAL IMPRESSION: 08/14/2022 Patient tolerated session well.  Tolerated manual well reporting reduced tension afterwards Overall limited in therapeutic exercise due to pain but improved mobility noted afterwards. Patient with scheduled hip replacement end of month, will continue to work on ROM and strength for improved post operative outcomes.    Eval: Patient presents to physical therapy with complaints of right lower extremity pain that started last year a couple days after RSV vaccination.  Patient presents with muscle guarding, pain, range of motion and strength deficits in right lower extremity.  Discussed findings and plan for physical therapy moving forward.  Patient with desire to continue to run and remain active which pain is currently limiting his intensity of exercise at this time.  Patient would greatly benefit from skilled physical therapy to reduce risk of further injury and return patient to optimal function.   OBJECTIVE IMPAIRMENTS: decreased activity tolerance, decreased mobility, difficulty walking, decreased ROM, decreased strength, and pain.    ACTIVITY LIMITATIONS: standing, locomotion level, and running   PARTICIPATION LIMITATIONS: community activity, yard work, and recreational activity   PERSONAL FACTORS: Age are also affecting patient's functional outcome.    REHAB POTENTIAL: Good   CLINICAL DECISION MAKING: Evolving/moderately complex   EVALUATION COMPLEXITY: moderate     GOALS: Goals reviewed with patient? yes   SHORT TERM GOALS: Target date: 08/14/22 Patient will be independent in self management strategies to improve quality of life and functional outcomes. Baseline: New Program Goal status: PROGRESSING   2.  Patient will report at least 50% improvement in overall symptoms and/or function to demonstrate improved functional mobility Baseline: 0% better Goal status NEW   3.  Patient will demonstrate pain-free  MMT in bilateral lower extremities Baseline: Painful Goal status: NEW      * LONG TERM GOALS: Target date: 09/11/22    Patient will report at least 50% improvement in overall symptoms and/or function to demonstrate improved functional mobility Baseline: 0%  Goal status: NEW   2.  Patient will be able report performing HEP regularly to improve general outcomes for upcoming surgery. Goal status: NEW   3.  Patient report wearing compression garments regularly (if indicated) to help reduce swelling Baseline: not currently  Goal status: NEW     PLAN:   PT FREQUENCY: 1x/week   PT DURATION: 8 weeks   PLANNED INTERVENTIONS: Therapeutic exercises, Therapeutic activity, Neuromuscular re-education, Balance training, Gait training, Patient/Family education, Self Care, Joint mobilization, Joint manipulation, Stair training, Vestibular training, Orthotic/Fit training, DME instructions, Aquatic Therapy, Dry Needling, Electrical stimulation, Spinal manipulation, Spinal mobilization, Cryotherapy, Moist heat, Traction, Ultrasound, Ionotophoresis 4mg /ml Dexamethasone, Manual therapy, and Re-evaluation   PLAN FOR NEXT SESSION: isometrics, ROM, edema management -   4:10 PM, 08/14/22 Tereasa Coop, DPT Physical Therapy with Dol[facility] Lory

## 2022-08-15 ENCOUNTER — Ambulatory Visit: Payer: Medicare PPO | Admitting: Internal Medicine

## 2022-08-15 ENCOUNTER — Ambulatory Visit
Admission: RE | Admit: 2022-08-15 | Discharge: 2022-08-15 | Disposition: A | Discharge status: Home / Self Care | Payer: Medicare PPO | Source: Ambulatory Visit | Attending: Internal Medicine | Admitting: Internal Medicine

## 2022-08-15 DIAGNOSIS — I7121 Aneurysm of the ascending aorta, without rupture: Secondary | ICD-10-CM | POA: Diagnosis not present

## 2022-08-15 DIAGNOSIS — I712 Thoracic aortic aneurysm, without rupture, unspecified: Secondary | ICD-10-CM

## 2022-08-15 DIAGNOSIS — J479 Bronchiectasis, uncomplicated: Secondary | ICD-10-CM | POA: Diagnosis not present

## 2022-08-16 DIAGNOSIS — R932 Abnormal findings on diagnostic imaging of liver and biliary tract: Secondary | ICD-10-CM

## 2022-08-21 ENCOUNTER — Ambulatory Visit
Admission: RE | Admit: 2022-08-21 | Discharge: 2022-08-21 | Disposition: A | Discharge status: Home / Self Care | Payer: Medicare PPO | Source: Ambulatory Visit | Attending: Internal Medicine | Admitting: Internal Medicine

## 2022-08-21 ENCOUNTER — Ambulatory Visit: Payer: Medicare PPO

## 2022-08-21 ENCOUNTER — Encounter: Payer: Medicare PPO | Admitting: Physical Therapy

## 2022-08-21 ENCOUNTER — Other Ambulatory Visit (INDEPENDENT_AMBULATORY_CARE_PROVIDER_SITE_OTHER): Payer: Medicare PPO

## 2022-08-21 ENCOUNTER — Encounter (HOSPITAL_COMMUNITY): Payer: Self-pay

## 2022-08-21 DIAGNOSIS — M1611 Unilateral primary osteoarthritis, right hip: Secondary | ICD-10-CM | POA: Diagnosis not present

## 2022-08-21 DIAGNOSIS — K8020 Calculus of gallbladder without cholecystitis without obstruction: Secondary | ICD-10-CM | POA: Diagnosis not present

## 2022-08-21 DIAGNOSIS — R932 Abnormal findings on diagnostic imaging of liver and biliary tract: Secondary | ICD-10-CM

## 2022-08-21 NOTE — Pre-Procedure Instructions (Signed)
Surgical Instructions   Your procedure is scheduled on Thursday, August 29th. Report to [facility] Main Entrance "A" at 07:00 A.M., then check in with the Admitting office. Any questions or running late day of surgery: call [phone]  Questions prior to your surgery date: call [phone], Monday-Friday, 8am-4pm. If you experience any cold or flu symptoms such as cough, fever, chills, shortness of breath, etc. between now and your scheduled surgery, please notify us at the above number.     Remember:  Do not eat after midnight the night before your surgery  You may drink clear liquids until 06:30 AM the morning of your surgery.   Clear liquids allowed are: Water, Non-Citrus Juices (without pulp), Carbonated Beverages, Clear Tea, Black Coffee Only (NO MILK, CREAM OR POWDERED CREAMER of any kind), and Gatorade.  Patient Instructions  The night before surgery:  No food after midnight. ONLY clear liquids after midnight  The day of surgery (if you do NOT have diabetes):  Drink ONE (1) Pre-Surgery Clear Ensure by 06:30 AM the morning of surgery. Drink in one sitting. Do not sip.  This drink was given to you during your hospital  pre-op appointment visit.  Nothing else to drink after completing the  Pre-Surgery Clear Ensure.         If you have questions, please contact your surgeon's office.    Take these medicines the morning of surgery with A SIP OF WATER  amLODipine (NORVASC)  brimonidine-timolol (COMBIGAN)  dorzolamide (TRUSOPT)     One week prior to surgery, STOP taking any Aspirin (unless otherwise instructed by your surgeon) Aleve, Naproxen, Ibuprofen, Motrin, Advil, Goody's, BC's, all herbal medications, fish oil, and non-prescription vitamins.                     Do NOT Smoke (Tobacco/Vaping) for 24 hours prior to your procedure.  If you use a CPAP at night, you may bring your mask/headgear for your overnight stay.   You will be asked to remove any contacts,  glasses, piercing's, hearing aid's, dentures/partials prior to surgery. Please bring cases for these items if needed.    Patients discharged the day of surgery will not be allowed to drive home, and someone needs to stay with them for 24 hours.  SURGICAL WAITING ROOM VISITATION Patients may have no more than 2 support people in the waiting area - these visitors may rotate.   Pre-op nurse will coordinate an appropriate time for 1 ADULT support person, who may not rotate, to accompany patient in pre-op.  Children under the age of 33 must have an adult with them who is not the patient and must remain in the main waiting area with an adult.  If the patient needs to stay at the hospital during part of their recovery, the visitor guidelines for inpatient rooms apply.  Please refer to the [facility] website for the visitor guidelines for any additional information.   If you received a COVID test during your pre-op visit  it is requested that you wear a mask when out in public, stay away from anyone that may not be feeling well and notify your surgeon if you develop symptoms. If you have been in contact with anyone that has tested positive in the last 10 days please notify you surgeon.      Pre-operative 5 CHG Bathing Instructions   You can play a key role in reducing the risk of infection after surgery. Your skin needs to be as  free of germs as possible. You can reduce the number of germs on your skin by washing with CHG (chlorhexidine gluconate) soap before surgery. CHG is an antiseptic soap that kills germs and continues to kill germs even after washing.   DO NOT use if you have an allergy to chlorhexidine/CHG or antibacterial soaps. If your skin becomes reddened or irritated, stop using the CHG and notify one of our RNs at [phone].   Please shower with the CHG soap starting 4 days before surgery using the following schedule:     Please keep in mind the following:  DO NOT shave,  including legs and underarms, starting the day of your first shower.   You may shave your face at any point before/day of surgery.  Place clean sheets on your bed the day you start using CHG soap. Use a clean washcloth (not used since being washed) for each shower. DO NOT sleep with pets once you start using the CHG.   CHG Shower Instructions:  If you choose to wash your hair and private area, wash first with your normal shampoo/soap.  After you use shampoo/soap, rinse your hair and body thoroughly to remove shampoo/soap residue.  Turn the water OFF and apply about 3 tablespoons (45 ml) of CHG soap to a CLEAN washcloth.  Apply CHG soap ONLY FROM YOUR NECK DOWN TO YOUR TOES (washing for 3-5 minutes)  DO NOT use CHG soap on face, private areas, open wounds, or sores.  Pay special attention to the area where your surgery is being performed.  If you are having back surgery, having someone wash your back for you may be helpful. Wait 2 minutes after CHG soap is applied, then you may rinse off the CHG soap.  Pat dry with a clean towel  Put on clean clothes/pajamas   If you choose to wear lotion, please use ONLY the CHG-compatible lotions on the back of this paper.   Additional instructions for the day of surgery: DO NOT APPLY any lotions, deodorants, cologne, or perfumes.   Do not bring valuables to the hospital. [facility] is not responsible for any belongings/valuables. Do not wear nail polish, gel polish, artificial nails, or any other type of covering on natural nails (fingers and toes) Do not wear jewelry or makeup Put on clean/comfortable clothes.  Please brush your teeth.  Ask your nurse before applying any prescription medications to the skin.     CHG Compatible Lotions   Aveeno Moisturizing lotion  Cetaphil Moisturizing Cream  Cetaphil Moisturizing Lotion  Clairol Herbal Essence Moisturizing Lotion, Dry Skin  Clairol Herbal Essence Moisturizing Lotion, Extra Dry Skin  Clairol  Herbal Essence Moisturizing Lotion, Normal Skin  Curel Age Defying Therapeutic Moisturizing Lotion with Alpha Hydroxy  Curel Extreme Care Body Lotion  Curel Soothing Hands Moisturizing Hand Lotion  Curel Therapeutic Moisturizing Cream, Fragrance-Free  Curel Therapeutic Moisturizing Lotion, Fragrance-Free  Curel Therapeutic Moisturizing Lotion, Original Formula  Eucerin Daily Replenishing Lotion  Eucerin Dry Skin Therapy Plus Alpha Hydroxy Crme  Eucerin Dry Skin Therapy Plus Alpha Hydroxy Lotion  Eucerin Original Crme  Eucerin Original Lotion  Eucerin Plus Crme Eucerin Plus Lotion  Eucerin TriLipid Replenishing Lotion  Keri Anti-Bacterial Hand Lotion  Keri Deep Conditioning Original Lotion Dry Skin Formula Softly Scented  Keri Deep Conditioning Original Lotion, Fragrance Free Sensitive Skin Formula  Keri Lotion Fast Absorbing Fragrance Free Sensitive Skin Formula  Keri Lotion Fast Absorbing Softly Scented Dry Skin Formula  Keri Original Lotion  Keri Skin Renewal  Lotion WellPoint Smooth Lotion  Keri Silky Smooth Sensitive Skin Lotion  Nivea Body Creamy Conditioning Patent examiner Moisturizing Lotion Nivea Crme  Nivea Skin Firming Lotion  NutraDerm 30 Skin Lotion  NutraDerm Skin Lotion  NutraDerm Therapeutic Skin Cream  NutraDerm Therapeutic Skin Lotion  ProShield Protective Hand Cream  Provon moisturizing lotion  Please read over the following fact sheets that you were given.

## 2022-08-22 ENCOUNTER — Encounter (HOSPITAL_COMMUNITY): Payer: Self-pay

## 2022-08-22 ENCOUNTER — Encounter (HOSPITAL_COMMUNITY)
Admission: RE | Admit: 2022-08-22 | Discharge: 2022-08-22 | Disposition: A | Discharge status: Home / Self Care | Payer: Medicare PPO | Source: Ambulatory Visit | Attending: Orthopaedic Surgery | Admitting: Orthopaedic Surgery

## 2022-08-22 VITALS — BP 175/73 | HR 56 | Temp 98.1°F | Resp 18 | Ht 69.0 in | Wt 141.8 lb

## 2022-08-22 DIAGNOSIS — Z01812 Encounter for preprocedural laboratory examination: Secondary | ICD-10-CM | POA: Insufficient documentation

## 2022-08-22 DIAGNOSIS — M1611 Unilateral primary osteoarthritis, right hip: Secondary | ICD-10-CM | POA: Diagnosis not present

## 2022-08-22 DIAGNOSIS — R7301 Impaired fasting glucose: Secondary | ICD-10-CM | POA: Insufficient documentation

## 2022-08-22 DIAGNOSIS — Z01818 Encounter for other preprocedural examination: Secondary | ICD-10-CM

## 2022-08-22 HISTORY — DX: Essential (primary) hypertension: I10

## 2022-08-22 HISTORY — DX: Unspecified osteoarthritis, unspecified site: M19.90

## 2022-08-22 LAB — TYPE AND SCREEN
ABO/RH(D): O POS
Antibody Screen: NEGATIVE

## 2022-08-22 LAB — CBC
HCT: 42.6 % (ref 39.0–52.0)
Hemoglobin: 13.5 g/dL (ref 13.0–17.0)
MCH: 28.8 pg (ref 26.0–34.0)
MCHC: 31.7 g/dL (ref 30.0–36.0)
MCV: 90.8 fL (ref 80.0–100.0)
Platelets: 144 10*3/uL — ABNORMAL LOW (ref 150–400)
RBC: 4.69 MIL/uL (ref 4.22–5.81)
RDW: 13.5 % (ref 11.5–15.5)
WBC: 6 10*3/uL (ref 4.0–10.5)
nRBC: 0 % (ref 0.0–0.2)

## 2022-08-22 LAB — BASIC METABOLIC PANEL
Anion gap: 9 (ref 5–15)
BUN: 23 mg/dL (ref 8–23)
CO2: 25 mmol/L (ref 22–32)
Calcium: 9.4 mg/dL (ref 8.9–10.3)
Chloride: 105 mmol/L (ref 98–111)
Creatinine, Ser: 1.16 mg/dL (ref 0.61–1.24)
GFR, Estimated: >60 mL/min (ref >60)
Glucose, Bld: 119 mg/dL — ABNORMAL HIGH (ref 70–99)
Potassium: 4.1 mmol/L (ref 3.5–5.1)
Sodium: 139 mmol/L (ref 135–145)

## 2022-08-22 LAB — SURGICAL PCR SCREEN
MRSA, PCR: NEGATIVE
Staphylococcus aureus: NEGATIVE

## 2022-08-22 NOTE — Progress Notes (Signed)
PCP - Myrlene Broker, MD Cardiothoracic: Ellery Plunk, MD  PPM/ICD - Denies  Chest x-ray - Denies EKG - 08/14/2022 Stress Test - Denies ECHO - Denies Cardiac Cath - Denies  Sleep Study - Denies  DM: Denies  Blood Thinner Instructions: N/A Aspirin Instructions: N/A  ERAS Protcol - Yes PRE-SURGERY Ensure or G2- Ensure  COVID TEST- No   Anesthesia review: No  Patient denies shortness of breath, fever, cough and chest pain at PAT appointment   All instructions explained to the patient, with a verbal understanding of the material. Patient agrees to go over the instructions while at home for a better understanding.The opportunity to ask questions was provided.

## 2022-08-28 ENCOUNTER — Ambulatory Visit: Payer: Medicare PPO | Admitting: Physical Therapy

## 2022-08-28 ENCOUNTER — Encounter: Payer: Self-pay | Admitting: Physical Therapy

## 2022-08-28 DIAGNOSIS — M6281 Muscle weakness (generalized): Secondary | ICD-10-CM | POA: Diagnosis not present

## 2022-08-28 DIAGNOSIS — M79604 Pain in right leg: Secondary | ICD-10-CM | POA: Diagnosis not present

## 2022-08-28 DIAGNOSIS — R262 Difficulty in walking, not elsewhere classified: Secondary | ICD-10-CM

## 2022-08-28 NOTE — Therapy (Signed)
OUTPATIENT PHYSICAL THERAPY TREATMENT NOTE, RECERT      Patient Name: Danny Gross MRN: [id number] DOB:[birth year], 88 y.o., male Today's Date: 08/28/2022  PCP: Hillard Danker, Mervyn Skeeters MD   REFERRING PROVIDER: Rodolph Bong, MD  END OF SESSION:   PT End of Session - 08/28/22 1216     Visit Number 14    Number of Visits 18    Date for PT Re-Evaluation 09/11/22    Authorization Type humana -12 visits from 03/21/2022 - 05/02/2022,8 visits 07/17/2022 - 10/08/2022    Authorization - Visit Number 5    Authorization - Number of Visits 8    PT Start Time 1218    PT Stop Time 1256    PT Time Calculation (min) 38 min    Activity Tolerance Patient limited by pain    Behavior During Therapy [facility] for tasks assessed/performed               Past Medical History:  Diagnosis Date   Arthritis    Cataract    Emphysema of lung (HCC)    Hypertension    Pneumonia 2015   Past Surgical History:  Procedure Laterality Date   HERNIA REPAIR  2003   TONSILLECTOMY  1939   VIDEO BRONCHOSCOPY Bilateral 08/11/2013   Procedure: VIDEO BRONCHOSCOPY WITH FLUORO;  Surgeon: Nyoka Cowden, MD;  Location: [facility];  Service: Cardiopulmonary;  Laterality: Bilateral;   Patient Active Problem List   Diagnosis Date Noted   Essential hypertension 08/14/2022   Pre-op examination 08/14/2022   Bilateral hip joint arthritis 03/13/2022   Myalgia 11/28/2021   Thoracic aortic aneurysm (HCC) 06/07/2021   Aortic atherosclerosis (HCC) 07/19/2020   Bronchiectasis without complication (HCC) 04/22/2018   Impaired fasting glucose 10/04/2014   Routine general medical examination at a health care facility 12/21/2013   Pulmonary infiltrates 07/13/2013     THERAPY DIAG:  Muscle weakness (generalized)  Pain in right leg  Difficulty in walking, not elsewhere classified    REFERRING DIAG:  M25.551,G89.29 (ICD-10-CM) - Chronic pain of right hip  M87.051 (ICD-10-CM) - Avascular necrosis of bone of right  hip (HCC)  M16.0 (ICD-10-CM) - Bilateral hip joint arthritis      Rationale for Evaluation and Treatment: Rehabilitation   ONSET DATE: 8 months ago   SUBJECTIVE:    SUBJECTIVE STATEMENT: 08/28/2022 State he is eagerly awaiting his THA.   09/24/21 he got his first RSV shot and the next couple days he started getting leg cramping and pain and the pain has not let up. States that he has run for 70 years and never has had any problems. States his right knee buckles on occasion.  States he has cut down his running 3x/week and some sprinting. States he as running at a higher intensity.  Has tried prednisone         PERTINENT HISTORY: Lung emphysema PAIN:  Are you having pain? no: NPRS scale: 3/10 Pain location:  right thigh/hip  Pain description: aching, intense grabbing Aggravating factors: walking, standing, , getting in and out of car Relieving factors: rest, positioning   PRECAUTIONS: None   WEIGHT BEARING RESTRICTIONS: No   FALLS:  Has patient fallen in last 6 months? No     OCCUPATION: retired, singing, gardening, running,    PLOF: Independent   PATIENT GOALS: to have less pain      OBJECTIVE:    DIAGNOSTIC FINDINGS: 03/08/22 Pelvis xray IMPRESSION: 1. Bilateral hip degenerative changes, greater on the right. 2. Lower lumbar spine  degenerative changes.   Right knee xray FINDINGS: Mild-to-moderate medial joint space narrowing. Mild spur formation involving all 3 joint compartments. The lateral view is slightly oblique with no gross effusion seen. Atheromatous arterial calcifications.   IMPRESSION: Mild tricompartmental degenerative changes, as described above.   Left MRI 05/18/22   IMPRESSION: 1. Significant/advanced degenerative changes involving the right hip. Sizable focus of AVN involving the right femoral head with some surrounding marrow edema and associated moderate to large joint effusion. 2. Moderate degenerative changes involving the left  hip. 3. The superior anterior labrum of the left hip is degenerated and torn. 4. Mild bilateral peritrochanteric tendinosis but no trochanteric bursitis. 5. Mild edema like signal changes in the pectineus and obturator externus muscles suggesting muscle strains.        LE Measurements       Lower Extremity Right 07/17/22 Left 07/17/22    A/PROM MMT A/PROM MMT  Hip Flexion WFL* 4-* WFL* 4  Hip Extension 5 ( lacking) 2+* 5 (lacking) 3*  Hip Abduction          Hip Adduction          Hip Internal rotation 10*   45    Hip External rotation 30*   20*    Knee Flexion 125 4 125 4  Knee Extension 0 4 0 4  Ankle Dorsiflexion   4+   4+  Ankle Plantarflexion          Ankle Inversion          Ankle Eversion           (Blank rows = not tested) * pain-twinges    Bed mobilities: slow labored movements - no increase in pain.    Special tests/Observations: Leg elevation test: pale left foot no pain  Leg drop test- redness returned > 60 seconds after elevation on L  B pitting edema 4+, rubor noted with feet dependent position B   TODAY'S TREATMENT:                                                                                                                              DATE:  08/28/2022   Therapeutic Exercise: Seated:    Bike:   Neuromuscular Re-education: Manual Therapy:   Self Care:   Therapeutic Activity: transfers in and out of bed/chair/car/toilet - with and without strap for assistance 18 minutes Modalities:      PATIENT EDUCATION:  Education details:on HEP, about masking up/hand hygiene to keep from getting sick prior to upcoming surgery, about surgery and post op recovery Person educated: Patient Education method: Explanation, Demonstration, and Handouts Education comprehension: verbalized understanding     HOME EXERCISE PROGRAM: [id number]   ASSESSMENT:   CLINICAL IMPRESSION: 08/28/2022 Session focused on answering all questions about upcoming surgery. About post  op transfers and strategies to reduce pain/difficulty with ADLs following surgery. Reviewed HEP and reduced to exercises that would be safe post op. Discussed only performing  exercises that do not increase his pain. Will continue with current POC as tolerated.    Eval: Patient presents to physical therapy with complaints of right lower extremity pain that started last year a couple days after RSV vaccination.  Patient presents with muscle guarding, pain, range of motion and strength deficits in right lower extremity.  Discussed findings and plan for physical therapy moving forward.  Patient with desire to continue to run and remain active which pain is currently limiting his intensity of exercise at this time.  Patient would greatly benefit from skilled physical therapy to reduce risk of further injury and return patient to optimal function.   OBJECTIVE IMPAIRMENTS: decreased activity tolerance, decreased mobility, difficulty walking, decreased ROM, decreased strength, and pain.    ACTIVITY LIMITATIONS: standing, locomotion level, and running   PARTICIPATION LIMITATIONS: community activity, yard work, and recreational activity   PERSONAL FACTORS: Age are also affecting patient's functional outcome.    REHAB POTENTIAL: Good   CLINICAL DECISION MAKING: Evolving/moderately complex   EVALUATION COMPLEXITY: moderate     GOALS: Goals reviewed with patient? yes   SHORT TERM GOALS: Target date: 08/14/22 Patient will be independent in self management strategies to improve quality of life and functional outcomes. Baseline: New Program Goal status: PROGRESSING   2.  Patient will report at least 50% improvement in overall symptoms and/or function to demonstrate improved functional mobility Baseline: 0% better Goal status NEW   3.  Patient will demonstrate pain-free MMT in bilateral lower extremities Baseline: Painful Goal status: NEW      * LONG TERM GOALS: Target date: 09/11/22    Patient  will report at least 50% improvement in overall symptoms and/or function to demonstrate improved functional mobility Baseline: 0%  Goal status: NEW   2.  Patient will be able report performing HEP regularly to improve general outcomes for upcoming surgery. Goal status: NEW   3.  Patient report wearing compression garments regularly (if indicated) to help reduce swelling Baseline: not currently  Goal status: NEW     PLAN:   PT FREQUENCY: 1x/week   PT DURATION: 8 weeks   PLANNED INTERVENTIONS: Therapeutic exercises, Therapeutic activity, Neuromuscular re-education, Balance training, Gait training, Patient/Family education, Self Care, Joint mobilization, Joint manipulation, Stair training, Vestibular training, Orthotic/Fit training, DME instructions, Aquatic Therapy, Dry Needling, Electrical stimulation, Spinal manipulation, Spinal mobilization, Cryotherapy, Moist heat, Traction, Ultrasound, Ionotophoresis 4mg /ml Dexamethasone, Manual therapy, and Re-evaluation   PLAN FOR NEXT SESSION: isometrics, ROM, edema management -   1:12 PM, 08/28/22 Tereasa Coop, DPT Physical Therapy with Dolores Lory

## 2022-09-03 ENCOUNTER — Other Ambulatory Visit: Payer: Self-pay | Admitting: Physician Assistant

## 2022-09-03 MED ORDER — ASPIRIN 81 MG PO TBEC: 81.0000 mg | DELAYED_RELEASE_TABLET | Freq: Two times a day (BID) | ORAL | 0 refills | Status: AC

## 2022-09-03 MED ORDER — OXYCODONE-ACETAMINOPHEN 5-325 MG PO TABS: 1.0000 | ORAL_TABLET | Freq: Four times a day (QID) | ORAL | 0 refills | Status: DC | PRN

## 2022-09-03 MED ORDER — METHOCARBAMOL 750 MG PO TABS: 750.0000 mg | ORAL_TABLET | Freq: Two times a day (BID) | ORAL | 2 refills | Status: DC | PRN

## 2022-09-03 MED ORDER — ONDANSETRON HCL 4 MG PO TABS: 4.0000 mg | ORAL_TABLET | Freq: Three times a day (TID) | ORAL | 0 refills | Status: DC | PRN

## 2022-09-03 MED ORDER — DOCUSATE SODIUM 100 MG PO CAPS: 100.0000 mg | ORAL_CAPSULE | Freq: Every day | ORAL | 2 refills | Status: DC | PRN

## 2022-09-04 ENCOUNTER — Encounter: Payer: Self-pay | Admitting: Physical Therapy

## 2022-09-04 ENCOUNTER — Ambulatory Visit: Payer: Medicare PPO | Admitting: Physical Therapy

## 2022-09-04 DIAGNOSIS — R262 Difficulty in walking, not elsewhere classified: Secondary | ICD-10-CM

## 2022-09-04 DIAGNOSIS — M6281 Muscle weakness (generalized): Secondary | ICD-10-CM

## 2022-09-04 DIAGNOSIS — M79604 Pain in right leg: Secondary | ICD-10-CM | POA: Diagnosis not present

## 2022-09-04 NOTE — Therapy (Signed)
OUTPATIENT PHYSICAL THERAPY TREATMENT NOTE, RECERT  PHYSICAL THERAPY DISCHARGE SUMMARY  Visits from Start of Care: 15  Current functional level related to goals / functional outcomes: PARTIALLY  MET    Remaining deficits: ROM/pain/strength/walking/transfers   Education / Equipment: See below   Patient agrees to discharge. Patient goals were not met. Patient is being discharged due to  patient scheduled for hip replacement surgery this week due to avascular necrosis.     Patient Name: Danny Gross MRN: [id number] DOB:[birth year], 88 y.o., male Today's Date: 09/04/2022  PCP: Hillard Danker, Mervyn Skeeters MD   REFERRING PROVIDER: Rodolph Bong, MD  END OF SESSION:   PT End of Session - 09/04/22 1218     Visit Number 15    Number of Visits 18    Date for PT Re-Evaluation 09/11/22    Authorization Type humana -12 visits from 03/21/2022 - 05/02/2022,8 visits 07/17/2022 - 10/08/2022    Authorization - Visit Number 6    Authorization - Number of Visits 8    PT Start Time 1218    PT Stop Time 1257    PT Time Calculation (min) 39 min    Activity Tolerance Patient limited by pain    Behavior During Therapy [facility] for tasks assessed/performed               Past Medical History:  Diagnosis Date   Arthritis    Cataract    Emphysema of lung (HCC)    Hypertension    Pneumonia 2015   Past Surgical History:  Procedure Laterality Date   HERNIA REPAIR  2003   TONSILLECTOMY  1939   VIDEO BRONCHOSCOPY Bilateral 08/11/2013   Procedure: VIDEO BRONCHOSCOPY WITH FLUORO;  Surgeon: Nyoka Cowden, MD;  Location: [facility];  Service: Cardiopulmonary;  Laterality: Bilateral;   Patient Active Problem List   Diagnosis Date Noted   Essential hypertension 08/14/2022   Pre-op examination 08/14/2022   Bilateral hip joint arthritis 03/13/2022   Myalgia 11/28/2021   Thoracic aortic aneurysm (HCC) 06/07/2021   Aortic atherosclerosis (HCC) 07/19/2020   Bronchiectasis without complication  (HCC) 04/22/2018   Impaired fasting glucose 10/04/2014   Routine general medical examination at a health care facility 12/21/2013   Pulmonary infiltrates 07/13/2013     THERAPY DIAG:  Muscle weakness (generalized)  Pain in right leg  Difficulty in walking, not elsewhere classified    REFERRING DIAG:  M25.551,G89.29 (ICD-10-CM) - Chronic pain of right hip  M87.051 (ICD-10-CM) - Avascular necrosis of bone of right hip (HCC)  M16.0 (ICD-10-CM) - Bilateral hip joint arthritis      Rationale for Evaluation and Treatment: Rehabilitation   ONSET DATE: 8 months ago   SUBJECTIVE:    SUBJECTIVE STATEMENT: 09/04/2022 Reports he feels about the same.  09/24/21 he got his first RSV shot and the next couple days he started getting leg cramping and pain and the pain has not let up. States that he has run for 70 years and never has had any problems. States his right knee buckles on occasion.  States he has cut down his running 3x/week and some sprinting. States he as running at a higher intensity.  Has tried prednisone         PERTINENT HISTORY: Lung emphysema PAIN:  Are you having pain? no: NPRS scale: 3/10 Pain location:  right thigh/hip  Pain description: aching, intense grabbing Aggravating factors: walking, standing, , getting in and out of car Relieving factors: rest, positioning   PRECAUTIONS: None   WEIGHT  BEARING RESTRICTIONS: No   FALLS:  Has patient fallen in last 6 months? No     OCCUPATION: retired, singing, gardening, running,    PLOF: Independent   PATIENT GOALS: to have less pain      OBJECTIVE:    DIAGNOSTIC FINDINGS: 03/08/22 Pelvis xray IMPRESSION: 1. Bilateral hip degenerative changes, greater on the right. 2. Lower lumbar spine degenerative changes.   Right knee xray FINDINGS: Mild-to-moderate medial joint space narrowing. Mild spur formation involving all 3 joint compartments. The lateral view is slightly oblique with no gross effusion  seen. Atheromatous arterial calcifications.   IMPRESSION: Mild tricompartmental degenerative changes, as described above.   Left MRI 05/18/22   IMPRESSION: 1. Significant/advanced degenerative changes involving the right hip. Sizable focus of AVN involving the right femoral head with some surrounding marrow edema and associated moderate to large joint effusion. 2. Moderate degenerative changes involving the left hip. 3. The superior anterior labrum of the left hip is degenerated and torn. 4. Mild bilateral peritrochanteric tendinosis but no trochanteric bursitis. 5. Mild edema like signal changes in the pectineus and obturator externus muscles suggesting muscle strains.        LE Measurements       Lower Extremity Right 09/04/22 Left 09/04/22    A/PROM MMT A/PROM MMT  Hip Flexion WFL 4 WFL 4  Hip Extension 5 ( lacking) 2+* 5 (lacking)   Hip Abduction        +*  Hip Adduction          Hip Internal rotation 10*   45    Hip External rotation 30*   20*    Knee Flexion 125 4 125 4  Knee Extension 0 4 0 4  Ankle Dorsiflexion   4+   4+  Ankle Plantarflexion          Ankle Inversion          Ankle Eversion           (Blank rows = not tested) * pain-twinges    Bed mobilities: slow labored movements - no increase in pain.       TODAY'S TREATMENT:                                                                                                                              DATE:  09/04/2022 Objective measurements updated  Revewed HEP and post op procedure, rehab and differences between HHPT and outpatient PT Therapeutic Exercise: Seated:    Bike:   Neuromuscular Re-education: Manual Therapy:   Self Care:   Therapeutic Activity: minutes Modalities:      PATIENT EDUCATION:  Education details:on HEP, on basic wound care for skin tear on left leg, on post op recovery and rehab Person educated: Patient Education method: Explanation, Demonstration, and Handouts Education  comprehension: verbalized understanding     HOME EXERCISE PROGRAM: [id number]   ASSESSMENT:   CLINICAL IMPRESSION: 09/04/2022 Updated objective measurements. Minimal progress made but  anticipated this since imaging report and patient scheduled for THA. Improved strength but continued pain, and ROM deficits. Answered all questions. Patient to DC to HEP secondary to needing surgery and continued pain and functional defecits. Patient moving better and better prepared for upcoming surgery.    Eval: Patient presents to physical therapy with complaints of right lower extremity pain that started last year a couple days after RSV vaccination.  Patient presents with muscle guarding, pain, range of motion and strength deficits in right lower extremity.  Discussed findings and plan for physical therapy moving forward.  Patient with desire to continue to run and remain active which pain is currently limiting his intensity of exercise at this time.  Patient would greatly benefit from skilled physical therapy to reduce risk of further injury and return patient to optimal function.   OBJECTIVE IMPAIRMENTS: decreased activity tolerance, decreased mobility, difficulty walking, decreased ROM, decreased strength, and pain.    ACTIVITY LIMITATIONS: standing, locomotion level, and running   PARTICIPATION LIMITATIONS: community activity, yard work, and recreational activity   PERSONAL FACTORS: Age are also affecting patient's functional outcome.    REHAB POTENTIAL: Good   CLINICAL DECISION MAKING: Evolving/moderately complex   EVALUATION COMPLEXITY: moderate     GOALS: Goals reviewed with patient? yes   SHORT TERM GOALS: Target date: 08/14/22 Patient will be independent in self management strategies to improve quality of life and functional outcomes. Baseline: New Program Goal status: MET   2.  Patient will report at least 50% improvement in overall symptoms and/or function to demonstrate improved  functional mobility Baseline: 0% better Goal status NOT MET   3.  Patient will demonstrate pain-free MMT in bilateral lower extremities Baseline: Painful Goal status: NOT MET      * LONG TERM GOALS: Target date: 09/11/22    Patient will report at least 50% improvement in overall symptoms and/or function to demonstrate improved functional mobility Baseline: 0%  Goal status: NOT MET   2.  Patient will be able report performing HEP regularly to improve general outcomes for upcoming surgery. Goal status: MET   3.  Patient report wearing compression garments regularly (if indicated) to help reduce swelling Baseline: not currently  Goal status: MET     PLAN:   PT FREQUENCY: 1x/week   PT DURATION: 8 weeks   PLANNED INTERVENTIONS: Therapeutic exercises, Therapeutic activity, Neuromuscular re-education, Balance training, Gait training, Patient/Family education, Self Care, Joint mobilization, Joint manipulation, Stair training, Vestibular training, Orthotic/Fit training, DME instructions, Aquatic Therapy, Dry Needling, Electrical stimulation, Spinal manipulation, Spinal mobilization, Cryotherapy, Moist heat, Traction, Ultrasound, Ionotophoresis 4mg /ml Dexamethasone, Manual therapy, and Re-evaluation   PLAN FOR NEXT SESSION: DC to HEP   1:19 PM, 09/04/22 Tereasa Coop, DPT Physical Therapy with Dolores Lory

## 2022-09-05 ENCOUNTER — Telehealth: Payer: Self-pay | Admitting: *Deleted

## 2022-09-05 MED ORDER — TRANEXAMIC ACID 1000 MG/10ML IV SOLN
2000.0000 mg | INTRAVENOUS | Status: AC
  Filled 2022-09-05: qty 20

## 2022-09-05 NOTE — Progress Notes (Signed)
patient voiced understanding of new arrival time of 0530 tomorrow

## 2022-09-05 NOTE — Telephone Encounter (Signed)
Ortho bundle pre-op call completed. 

## 2022-09-05 NOTE — Care Plan (Signed)
[facility] RNCM call to patient to discuss his upcoming Right total hip arthroplasty with Dr. Roda Shutters on 09/06/22. Patient is an ortho bundle and agreeable to case management. He lives with his spouse, who will be able to assist at home after discharge. He will need a RW prior to discharge from hospital. Anticipate HHPT will be needed after a short hospital stay. Referral made to [facility] after choice provided. Reviewed all post op care instructions. Will continue to follow for needs.

## 2022-09-06 ENCOUNTER — Encounter (HOSPITAL_COMMUNITY): Payer: Self-pay | Admitting: Orthopaedic Surgery

## 2022-09-06 ENCOUNTER — Observation Stay (HOSPITAL_COMMUNITY): Payer: Medicare PPO

## 2022-09-06 ENCOUNTER — Encounter (HOSPITAL_COMMUNITY)
Admission: RE | Disposition: A | Discharge status: Home / Self Care | Payer: Self-pay | Source: Home / Self Care | Attending: Orthopaedic Surgery

## 2022-09-06 ENCOUNTER — Observation Stay (HOSPITAL_COMMUNITY)
Admission: RE | Admit: 2022-09-06 | Discharge: 2022-09-07 | Disposition: A | Discharge status: Home / Self Care | Payer: Medicare PPO | Attending: Orthopaedic Surgery | Admitting: Orthopaedic Surgery

## 2022-09-06 ENCOUNTER — Ambulatory Visit (HOSPITAL_BASED_OUTPATIENT_CLINIC_OR_DEPARTMENT_OTHER): Payer: Medicare PPO | Admitting: Certified Registered"

## 2022-09-06 ENCOUNTER — Ambulatory Visit (HOSPITAL_COMMUNITY): Payer: Medicare PPO | Admitting: Certified Registered"

## 2022-09-06 ENCOUNTER — Other Ambulatory Visit: Payer: Self-pay

## 2022-09-06 DIAGNOSIS — Z471 Aftercare following joint replacement surgery: Secondary | ICD-10-CM | POA: Diagnosis not present

## 2022-09-06 DIAGNOSIS — Z7982 Long term (current) use of aspirin: Secondary | ICD-10-CM | POA: Insufficient documentation

## 2022-09-06 DIAGNOSIS — M1611 Unilateral primary osteoarthritis, right hip: Secondary | ICD-10-CM

## 2022-09-06 DIAGNOSIS — I10 Essential (primary) hypertension: Secondary | ICD-10-CM | POA: Insufficient documentation

## 2022-09-06 DIAGNOSIS — Z79899 Other long term (current) drug therapy: Secondary | ICD-10-CM | POA: Insufficient documentation

## 2022-09-06 DIAGNOSIS — Z96641 Presence of right artificial hip joint: Secondary | ICD-10-CM | POA: Diagnosis not present

## 2022-09-06 HISTORY — PX: TOTAL HIP ARTHROPLASTY: SHX124

## 2022-09-06 SURGERY — ARTHROPLASTY, HIP, TOTAL, ANTERIOR APPROACH
Anesthesia: Spinal | Site: Hip | Laterality: Right

## 2022-09-06 MED ORDER — EPHEDRINE 5 MG/ML INJ
INTRAVENOUS | Status: AC
  Filled 2022-09-06: qty 5

## 2022-09-06 MED ORDER — LACTATED RINGERS IV SOLN: INTRAVENOUS | Status: DC

## 2022-09-06 MED ORDER — CHLORHEXIDINE GLUCONATE 0.12 % MT SOLN
15.0000 mL | Freq: Once | OROMUCOSAL | Status: AC
  Administered 2022-09-06: 15 mL via OROMUCOSAL
  Filled 2022-09-06: qty 15

## 2022-09-06 MED ORDER — PHENYLEPHRINE 80 MCG/ML (10ML) SYRINGE FOR IV PUSH (FOR BLOOD PRESSURE SUPPORT)
PREFILLED_SYRINGE | INTRAVENOUS | Status: AC
  Filled 2022-09-06: qty 10

## 2022-09-06 MED ORDER — SORBITOL 70 % SOLN: 30.0000 mL | Freq: Every day | Status: DC | PRN

## 2022-09-06 MED ORDER — ACETAMINOPHEN 325 MG PO TABS
325.0000 mg | ORAL_TABLET | Freq: Four times a day (QID) | ORAL | Status: DC | PRN
  Filled 2022-09-06: qty 2

## 2022-09-06 MED ORDER — BUPIVACAINE-MELOXICAM ER 400-12 MG/14ML IJ SOLN
INTRAMUSCULAR | Status: AC
  Filled 2022-09-06: qty 1

## 2022-09-06 MED ORDER — STERILE WATER FOR IRRIGATION IR SOLN
Status: DC | PRN
Start: 2022-09-06 — End: 2022-09-06
  Administered 2022-09-06: 1000 mL

## 2022-09-06 MED ORDER — ONDANSETRON HCL 4 MG/2ML IJ SOLN
INTRAMUSCULAR | Status: AC
  Filled 2022-09-06: qty 2

## 2022-09-06 MED ORDER — TRANEXAMIC ACID 1000 MG/10ML IV SOLN
INTRAVENOUS | Status: DC | PRN
  Administered 2022-09-06: 2000 mg via TOPICAL

## 2022-09-06 MED ORDER — ASPIRIN 81 MG PO CHEW
81.0000 mg | CHEWABLE_TABLET | Freq: Two times a day (BID) | ORAL | Status: DC
  Administered 2022-09-06 – 2022-09-07 (×2): 81 mg via ORAL
  Filled 2022-09-06 (×2): qty 1

## 2022-09-06 MED ORDER — MAGNESIUM CITRATE PO SOLN: 1.0000 | Freq: Once | ORAL | Status: DC | PRN

## 2022-09-06 MED ORDER — AMLODIPINE BESYLATE 2.5 MG PO TABS
2.5000 mg | ORAL_TABLET | Freq: Every day | ORAL | Status: DC
  Administered 2022-09-06 – 2022-09-07 (×2): 2.5 mg via ORAL
  Filled 2022-09-06 (×2): qty 1

## 2022-09-06 MED ORDER — DIPHENHYDRAMINE HCL 12.5 MG/5ML PO ELIX: 25.0000 mg | ORAL_SOLUTION | ORAL | Status: DC | PRN

## 2022-09-06 MED ORDER — PROPOFOL 10 MG/ML IV BOLUS
INTRAVENOUS | Status: AC
  Filled 2022-09-06: qty 20

## 2022-09-06 MED ORDER — HYDROMORPHONE HCL 1 MG/ML IJ SOLN: 0.2500 mg | INTRAMUSCULAR | Status: DC | PRN

## 2022-09-06 MED ORDER — VANCOMYCIN HCL 1 G IV SOLR
INTRAVENOUS | Status: DC | PRN
  Administered 2022-09-06: 1000 mg via TOPICAL

## 2022-09-06 MED ORDER — TRANEXAMIC ACID-NACL 1000-0.7 MG/100ML-% IV SOLN
1000.0000 mg | INTRAVENOUS | Status: AC
  Administered 2022-09-06: 1000 mg via INTRAVENOUS
  Filled 2022-09-06: qty 100

## 2022-09-06 MED ORDER — BUPIVACAINE-MELOXICAM ER 400-12 MG/14ML IJ SOLN
INTRAMUSCULAR | Status: DC | PRN
  Administered 2022-09-06: 400 mg

## 2022-09-06 MED ORDER — ONDANSETRON HCL 4 MG PO TABS: 4.0000 mg | ORAL_TABLET | Freq: Four times a day (QID) | ORAL | Status: DC | PRN

## 2022-09-06 MED ORDER — DEXAMETHASONE SODIUM PHOSPHATE 10 MG/ML IJ SOLN
INTRAMUSCULAR | Status: AC
  Filled 2022-09-06: qty 1

## 2022-09-06 MED ORDER — OXYCODONE HCL 5 MG/5ML PO SOLN: 5.0000 mg | Freq: Once | ORAL | Status: DC | PRN

## 2022-09-06 MED ORDER — METOCLOPRAMIDE HCL 5 MG/ML IJ SOLN: 5.0000 mg | Freq: Three times a day (TID) | INTRAMUSCULAR | Status: DC | PRN

## 2022-09-06 MED ORDER — POVIDONE-IODINE 10 % EX SWAB
2.0000 | Freq: Once | CUTANEOUS | Status: AC
  Administered 2022-09-06: 2 via TOPICAL

## 2022-09-06 MED ORDER — OXYCODONE HCL 5 MG PO TABS: 10.0000 mg | ORAL_TABLET | ORAL | Status: DC | PRN

## 2022-09-06 MED ORDER — PHENOL 1.4 % MT LIQD: 1.0000 | OROMUCOSAL | Status: DC | PRN

## 2022-09-06 MED ORDER — PROPOFOL 10 MG/ML IV BOLUS
INTRAVENOUS | Status: DC | PRN
  Administered 2022-09-06: 40 mg via INTRAVENOUS

## 2022-09-06 MED ORDER — OXYCODONE HCL 5 MG PO TABS
5.0000 mg | ORAL_TABLET | ORAL | Status: DC | PRN
  Administered 2022-09-06: 10 mg via ORAL
  Filled 2022-09-06: qty 2

## 2022-09-06 MED ORDER — ACETAMINOPHEN 500 MG PO TABS
1000.0000 mg | ORAL_TABLET | Freq: Four times a day (QID) | ORAL | Status: AC
  Administered 2022-09-06 – 2022-09-07 (×4): 1000 mg via ORAL
  Filled 2022-09-06 (×4): qty 2

## 2022-09-06 MED ORDER — METHOCARBAMOL 500 MG PO TABS
500.0000 mg | ORAL_TABLET | Freq: Four times a day (QID) | ORAL | Status: DC | PRN
  Administered 2022-09-06: 500 mg via ORAL
  Filled 2022-09-06 (×2): qty 1

## 2022-09-06 MED ORDER — CEFAZOLIN SODIUM-DEXTROSE 2-4 GM/100ML-% IV SOLN
2.0000 g | INTRAVENOUS | Status: AC
  Administered 2022-09-06: 2 g via INTRAVENOUS
  Filled 2022-09-06: qty 100

## 2022-09-06 MED ORDER — PHENYLEPHRINE HCL-NACL 20-0.9 MG/250ML-% IV SOLN
INTRAVENOUS | Status: DC | PRN
  Administered 2022-09-06: 20 ug/min via INTRAVENOUS

## 2022-09-06 MED ORDER — DEXAMETHASONE SODIUM PHOSPHATE 10 MG/ML IJ SOLN
10.0000 mg | Freq: Once | INTRAMUSCULAR | Status: AC
  Administered 2022-09-07: 10 mg via INTRAVENOUS
  Filled 2022-09-06 (×2): qty 1

## 2022-09-06 MED ORDER — SODIUM CHLORIDE 0.9 % IR SOLN
Status: DC | PRN
  Administered 2022-09-06: 1000 mL

## 2022-09-06 MED ORDER — PROPOFOL 500 MG/50ML IV EMUL
INTRAVENOUS | Status: DC | PRN
  Administered 2022-09-06: 50 ug/kg/min via INTRAVENOUS

## 2022-09-06 MED ORDER — ONDANSETRON HCL 4 MG/2ML IJ SOLN
4.0000 mg | Freq: Four times a day (QID) | INTRAMUSCULAR | Status: DC | PRN
  Administered 2022-09-06: 4 mg via INTRAVENOUS
  Filled 2022-09-06: qty 2

## 2022-09-06 MED ORDER — HYDROMORPHONE HCL 1 MG/ML IJ SOLN
0.5000 mg | INTRAMUSCULAR | Status: DC | PRN
  Administered 2022-09-06: 1 mg via INTRAVENOUS
  Filled 2022-09-06: qty 1

## 2022-09-06 MED ORDER — MENTHOL 3 MG MT LOZG: 1.0000 | LOZENGE | OROMUCOSAL | Status: DC | PRN

## 2022-09-06 MED ORDER — LIDOCAINE 2% (20 MG/ML) 5 ML SYRINGE
INTRAMUSCULAR | Status: DC | PRN
  Administered 2022-09-06: 40 mg via INTRAVENOUS

## 2022-09-06 MED ORDER — OXYCODONE HCL 5 MG PO TABS
ORAL_TABLET | ORAL | Status: AC
  Filled 2022-09-06: qty 2

## 2022-09-06 MED ORDER — ORAL CARE MOUTH RINSE: 15.0000 mL | Freq: Once | OROMUCOSAL | Status: AC

## 2022-09-06 MED ORDER — PROMETHAZINE HCL 25 MG/ML IJ SOLN: 6.2500 mg | INTRAMUSCULAR | Status: DC | PRN

## 2022-09-06 MED ORDER — SODIUM CHLORIDE 0.9 % IV SOLN: INTRAVENOUS | Status: DC

## 2022-09-06 MED ORDER — 0.9 % SODIUM CHLORIDE (POUR BTL) OPTIME
TOPICAL | Status: DC | PRN
  Administered 2022-09-06: 1000 mL

## 2022-09-06 MED ORDER — FERROUS SULFATE 325 (65 FE) MG PO TABS
325.0000 mg | ORAL_TABLET | Freq: Three times a day (TID) | ORAL | Status: DC
  Administered 2022-09-06 – 2022-09-07 (×4): 325 mg via ORAL
  Filled 2022-09-06 (×4): qty 1

## 2022-09-06 MED ORDER — CEFAZOLIN SODIUM-DEXTROSE 2-4 GM/100ML-% IV SOLN
2.0000 g | Freq: Four times a day (QID) | INTRAVENOUS | Status: AC
  Administered 2022-09-06 (×2): 2 g via INTRAVENOUS
  Filled 2022-09-06 (×2): qty 100

## 2022-09-06 MED ORDER — METHOCARBAMOL 1000 MG/10ML IJ SOLN: 500.0000 mg | Freq: Four times a day (QID) | INTRAVENOUS | Status: DC | PRN

## 2022-09-06 MED ORDER — BUPIVACAINE IN DEXTROSE 0.75-8.25 % IT SOLN
INTRATHECAL | Status: DC | PRN
Start: 2022-09-06 — End: 2022-09-06
  Administered 2022-09-06: 1.8 mL via INTRATHECAL

## 2022-09-06 MED ORDER — TRANEXAMIC ACID-NACL 1000-0.7 MG/100ML-% IV SOLN
1000.0000 mg | Freq: Once | INTRAVENOUS | Status: AC
  Administered 2022-09-06: 1000 mg via INTRAVENOUS
  Filled 2022-09-06: qty 100

## 2022-09-06 MED ORDER — METOCLOPRAMIDE HCL 5 MG PO TABS: 5.0000 mg | ORAL_TABLET | Freq: Three times a day (TID) | ORAL | Status: DC | PRN

## 2022-09-06 MED ORDER — POLYETHYLENE GLYCOL 3350 17 G PO PACK
17.0000 g | PACK | Freq: Every day | ORAL | Status: DC
  Administered 2022-09-06: 17 g via ORAL
  Filled 2022-09-06: qty 1

## 2022-09-06 MED ORDER — VANCOMYCIN HCL 1000 MG IV SOLR
INTRAVENOUS | Status: AC
  Filled 2022-09-06: qty 20

## 2022-09-06 MED ORDER — LIDOCAINE 2% (20 MG/ML) 5 ML SYRINGE
INTRAMUSCULAR | Status: AC
  Filled 2022-09-06: qty 5

## 2022-09-06 MED ORDER — PROPOFOL 1000 MG/100ML IV EMUL
INTRAVENOUS | Status: AC
  Filled 2022-09-06: qty 100

## 2022-09-06 MED ORDER — ALUM & MAG HYDROXIDE-SIMETH 200-200-20 MG/5ML PO SUSP: 30.0000 mL | ORAL | Status: DC | PRN

## 2022-09-06 MED ORDER — OXYCODONE HCL 5 MG PO TABS: 5.0000 mg | ORAL_TABLET | Freq: Once | ORAL | Status: DC | PRN

## 2022-09-06 MED ORDER — DOCUSATE SODIUM 100 MG PO CAPS
100.0000 mg | ORAL_CAPSULE | Freq: Two times a day (BID) | ORAL | Status: DC
  Administered 2022-09-06 – 2022-09-07 (×3): 100 mg via ORAL
  Filled 2022-09-06 (×3): qty 1

## 2022-09-06 MED ORDER — PANTOPRAZOLE SODIUM 40 MG PO TBEC
40.0000 mg | DELAYED_RELEASE_TABLET | Freq: Every day | ORAL | Status: DC
  Administered 2022-09-06 – 2022-09-07 (×2): 40 mg via ORAL
  Filled 2022-09-06 (×2): qty 1

## 2022-09-06 SURGICAL SUPPLY — 65 items
ADH SKN CLS APL DERMABOND .7 (GAUZE/BANDAGES/DRESSINGS) ×1
BAG COUNTER SPONGE SURGICOUNT (BAG) ×2 IMPLANT
BAG DECANTER FOR FLEXI CONT (MISCELLANEOUS) ×2 IMPLANT
BAG SPNG CNTER NS LX DISP (BAG) ×1
BLADE SAG 18X100X1.27 (BLADE) ×2 IMPLANT
COVER PERINEAL POST (MISCELLANEOUS) ×2 IMPLANT
COVER SURGICAL LIGHT HANDLE (MISCELLANEOUS) ×2 IMPLANT
CUP SECTOR GRIPTON 58MM (Orthopedic Implant) IMPLANT
DERMABOND ADVANCED .7 DNX12 (GAUZE/BANDAGES/DRESSINGS) IMPLANT
DRAPE C-ARM 42X72 X-RAY (DRAPES) ×2 IMPLANT
DRAPE POUCH INSTRU U-SHP 10X18 (DRAPES) ×2 IMPLANT
DRAPE STERI IOBAN 125X83 (DRAPES) ×2 IMPLANT
DRAPE U-SHAPE 47X51 STRL (DRAPES) ×4 IMPLANT
DRSG AQUACEL AG ADV 3.5X10 (GAUZE/BANDAGES/DRESSINGS) ×2 IMPLANT
DURAPREP 26ML APPLICATOR (WOUND CARE) ×4 IMPLANT
ELECT BLADE 4.0 EZ CLEAN MEGAD (MISCELLANEOUS) ×1
ELECT REM PT RETURN 9FT ADLT (ELECTROSURGICAL) ×1
ELECTRODE BLDE 4.0 EZ CLN MEGD (MISCELLANEOUS) ×2 IMPLANT
ELECTRODE REM PT RTRN 9FT ADLT (ELECTROSURGICAL) ×2 IMPLANT
FEM HEAD 40MM 1.5 OFFSET EZE M (Orthopedic Implant) ×1 IMPLANT
FEMORAL HEAD 40MM 1.5OFST EZ M (Orthopedic Implant) IMPLANT
GLOVE BIOGEL PI IND STRL 7.0 (GLOVE) ×4 IMPLANT
GLOVE BIOGEL PI IND STRL 7.5 (GLOVE) ×10 IMPLANT
GLOVE ECLIPSE 7.0 STRL STRAW (GLOVE) ×4 IMPLANT
GLOVE SKINSENSE STRL SZ7.5 (GLOVE) ×2 IMPLANT
GLOVE SURG SYN 7.5 E (GLOVE) ×2
GLOVE SURG SYN 7.5 PF PI (GLOVE) ×4 IMPLANT
GLOVE SURG UNDER POLY LF SZ7 (GLOVE) ×6 IMPLANT
GLOVE SURG UNDER POLY LF SZ7.5 (GLOVE) ×4 IMPLANT
GOWN STRL REUS W/ TWL LRG LVL3 (GOWN DISPOSABLE) IMPLANT
GOWN STRL REUS W/ TWL XL LVL3 (GOWN DISPOSABLE) ×2 IMPLANT
GOWN STRL REUS W/TWL LRG LVL3 (GOWN DISPOSABLE)
GOWN STRL REUS W/TWL XL LVL3 (GOWN DISPOSABLE) ×1
GOWN STRL SURGICAL XL XLNG (GOWN DISPOSABLE) ×2 IMPLANT
GOWN TOGA ZIPPER T7+ PEEL AWAY (MISCELLANEOUS) ×4 IMPLANT
HANDPIECE INTERPULSE COAX TIP (DISPOSABLE) ×1
HOOD PEEL AWAY T7 (MISCELLANEOUS) ×2 IMPLANT
INSERT NEUT HIP ALTRX 40 58 +4 (Insert) IMPLANT
IV NS IRRIG 3000ML ARTHROMATIC (IV SOLUTION) ×2 IMPLANT
KIT BASIN OR (CUSTOM PROCEDURE TRAY) ×2 IMPLANT
MARKER SKIN DUAL TIP RULER LAB (MISCELLANEOUS) ×2 IMPLANT
NDL SPNL 18GX3.5 QUINCKE PK (NEEDLE) ×2 IMPLANT
NEEDLE SPNL 18GX3.5 QUINCKE PK (NEEDLE) ×1
PACK TOTAL JOINT (CUSTOM PROCEDURE TRAY) ×2 IMPLANT
PACK UNIVERSAL I (CUSTOM PROCEDURE TRAY) ×2 IMPLANT
SCREW 6.5MMX25MM (Screw) IMPLANT
SET HNDPC FAN SPRY TIP SCT (DISPOSABLE) ×2 IMPLANT
SOLUTION PRONTOSAN WOUND 350ML (IRRIGATION / IRRIGATOR) ×2 IMPLANT
STAPLER VISISTAT 35W (STAPLE) IMPLANT
STEM FEMORAL SZ6 HIGH ACTIS (Stem) IMPLANT
SUT ETHIBOND 2 V 37 (SUTURE) ×2 IMPLANT
SUT ETHILON 2 0 FS 18 (SUTURE) IMPLANT
SUT VIC AB 0 CT1 27 (SUTURE) ×1
SUT VIC AB 0 CT1 27XBRD ANBCTR (SUTURE) ×2 IMPLANT
SUT VIC AB 1 CTX 36 (SUTURE) ×1
SUT VIC AB 1 CTX36XBRD ANBCTR (SUTURE) ×2 IMPLANT
SUT VIC AB 2-0 CT1 27 (SUTURE) ×3
SUT VIC AB 2-0 CT1 TAPERPNT 27 (SUTURE) ×4 IMPLANT
SYR 50ML LL SCALE MARK (SYRINGE) ×2 IMPLANT
TOWEL GREEN STERILE (TOWEL DISPOSABLE) ×2 IMPLANT
TRAY CATH INTERMITTENT SS 16FR (CATHETERS) IMPLANT
TRAY FOLEY W/BAG SLVR 16FR (SET/KITS/TRAYS/PACK)
TRAY FOLEY W/BAG SLVR 16FR ST (SET/KITS/TRAYS/PACK) IMPLANT
TUBE SUCT ARGYLE STRL (TUBING) ×2 IMPLANT
YANKAUER SUCT BULB TIP NO VENT (SUCTIONS) ×2 IMPLANT

## 2022-09-06 NOTE — H&P (Signed)
PREOPERATIVE H&P  Chief Complaint: right hip osteoarthritis  HPI: Danny Gross is a 88 y.o. male who presents for surgical treatment of right hip osteoarthritis.  He denies any changes in medical history.  Past Surgical History:  Procedure Laterality Date   HERNIA REPAIR  2003   TONSILLECTOMY  1939   VIDEO BRONCHOSCOPY Bilateral 08/11/2013   Procedure: VIDEO BRONCHOSCOPY WITH FLUORO;  Surgeon: Nyoka Cowden, MD;  Location: [facility];  Service: Cardiopulmonary;  Laterality: Bilateral;   Social History   Socioeconomic History   Marital status: Married    Spouse name: Not on file   Number of children: 2   Years of education: Not on file   Highest education level: Doctorate  Occupational History   Occupation: Retired  Tobacco Use   Smoking status: Never   Smokeless tobacco: Never  Vaping Use   Vaping status: Never Used  Substance and Sexual Activity   Alcohol use: Yes    Alcohol/week: 8.0 standard drinks of alcohol    Types: 1 Cans of beer, 7 Standard drinks or equivalent per week    Comment: cocktail daily   Drug use: No   Sexual activity: Not Currently  Other Topics Concern   Not on file  Social History Narrative   Not on file   Social Determinants of Health   Financial Resource Strain: Low Risk  (08/08/2022)   Overall Financial Resource Strain (CARDIA)    Difficulty of Paying Living Expenses: Not hard at all  Food Insecurity: No Food Insecurity (08/08/2022)   Hunger Vital Sign    Worried About Running Out of Food in the Last Year: Never true    Ran Out of Food in the Last Year: Never true  Transportation Needs: No Transportation Needs (08/08/2022)   PRAPARE - Administrator, Civil Service (Medical): No    Lack of Transportation (Non-Medical): No  Physical Activity: Unknown (08/08/2022)   Exercise Vital Sign    Days of Exercise per Week: 0 days    Minutes of Exercise per Session: Not on file  Stress: No Stress Concern Present (08/08/2022)    [employer] of Occupational Health - Occupational Stress Questionnaire    Feeling of Stress : Not at all  Social Connections: Socially Integrated (08/08/2022)   Social Connection and Isolation Panel [NHANES]    Frequency of Communication with Friends and Family: Twice a week    Frequency of Social Gatherings with Friends and Family: Three times a week    Attends Religious Services: More than 4 times per year    Active Member of Clubs or Organizations: Yes    Attends Engineer, structural: More than 4 times per year    Marital Status: Married   Family History  Problem Relation Age of Onset   Cancer Maternal Aunt        ? type    No Known Allergies Prior to Admission medications   Medication Sig Start Date End Date Taking? Authorizing Provider  aspirin EC 81 MG tablet Take 1 tablet (81 mg total) by mouth 2 (two) times daily. To be taken after surgery to prevent blood clots 09/03/22 09/03/23  Cristie Hem, PA-C  brimonidine-timolol (COMBIGAN) 0.2-0.5 % ophthalmic solution Place 1 drop into both eyes every 12 (twelve) hours.   Yes [provider]  docusate sodium (COLACE) 100 MG capsule Take 1 capsule (100 mg total) by mouth daily as needed. 09/03/22 09/03/23  Cristie Hem, PA-C  dorzolamide (TRUSOPT)  2 % ophthalmic solution Place 1 drop into both eyes 2 (two) times daily.   Yes [provider]  guaiFENesin (MUCINEX) 600 MG 12 hr tablet Take 600 mg by mouth 2 (two) times daily.   Yes [provider]  ibuprofen (ADVIL) 200 MG tablet Take 200 mg by mouth in the morning and at bedtime.   Yes [provider]  methocarbamol (ROBAXIN-750) 750 MG tablet Take 1 tablet (750 mg total) by mouth 2 (two) times daily as needed for muscle spasms. 09/03/22   Cristie Hem, PA-C  ondansetron (ZOFRAN) 4 MG tablet Take 1 tablet (4 mg total) by mouth every 8 (eight) hours as needed for nausea or vomiting. 09/03/22   Cristie Hem, PA-C   oxyCODONE-acetaminophen (PERCOCET) 5-325 MG tablet Take 1-2 tablets by mouth every 6 (six) hours as needed. 09/03/22   Cristie Hem, PA-C  ROCKLATAN 0.02-0.005 % SOLN Place 1 drop into both eyes at bedtime. 12/05/20  Yes [provider]  amLODipine (NORVASC) 2.5 MG tablet Take 1 tablet (2.5 mg total) by mouth daily. 08/14/22   Myrlene Broker, MD     Positive ROS: All other systems have been reviewed and were otherwise negative with the exception of those mentioned in the HPI and as above.  Physical Exam: General: Alert, no acute distress Cardiovascular: No pedal edema Respiratory: No cyanosis, no use of accessory musculature GI: abdomen soft Skin: No lesions in the area of chief complaint Neurologic: Sensation intact distally Psychiatric: Patient is competent for consent with normal mood and affect Lymphatic: no lymphedema  MUSCULOSKELETAL: exam stable  Assessment: right hip osteoarthritis  Plan: Plan for Procedure(s): TOTAL HIP ARTHROPLASTY ANTERIOR APPROACH  The risks benefits and alternatives were discussed with the patient including but not limited to the risks of nonoperative treatment, versus surgical intervention including infection, bleeding, nerve injury,  blood clots, cardiopulmonary complications, morbidity, mortality, among others, and they were willing to proceed.   Glee Arvin, MD 09/06/2022 5:47 AM

## 2022-09-06 NOTE — Anesthesia Procedure Notes (Signed)
Spinal  Patient location during procedure: OR Start time: 09/06/2022 7:21 AM End time: 09/06/2022 7:26 AM Reason for block: surgical anesthesia Staffing Performed: anesthesiologist  Anesthesiologist: Lowella Curb, MD Performed by: Lowella Curb, MD Authorized by: Lowella Curb, MD   Preanesthetic Checklist Completed: patient identified, IV checked, site marked, risks and benefits discussed, surgical consent, monitors and equipment checked, pre-op evaluation and timeout performed Spinal Block Patient position: sitting Prep: DuraPrep Patient monitoring: heart rate, cardiac monitor, continuous pulse ox and blood pressure Approach: midline Location: L3-4 Injection technique: single-shot Needle Needle type: Sprotte  Needle gauge: 24 G Needle length: 9 cm Assessment Sensory level: T4 Events: CSF return

## 2022-09-06 NOTE — Anesthesia Preprocedure Evaluation (Signed)
Anesthesia Evaluation    Airway Mallampati: II  TM Distance: >3 FB Neck ROM: Full    Dental no notable dental hx.    Pulmonary COPD   Pulmonary exam normal breath sounds clear to auscultation       Cardiovascular hypertension, Pt. on medications Normal cardiovascular exam Rhythm:Regular Rate:Normal     Neuro/Psych    GI/Hepatic   Endo/Other    Renal/GU      Musculoskeletal  (+) Arthritis , Osteoarthritis,    Abdominal   Peds  Hematology   Anesthesia Other Findings   Reproductive/Obstetrics                             Anesthesia Physical Anesthesia Plan  ASA: 2  Anesthesia Plan: Spinal   Post-op Pain Management:    Induction: Intravenous  PONV Risk Score and Plan: 1 and Ondansetron and Treatment may vary due to age or medical condition  Airway Management Planned: Simple Face Mask  Additional Equipment:   Intra-op Plan:   Post-operative Plan:   Informed Consent: I have reviewed the patients History and Physical, chart, labs and discussed the procedure including the risks, benefits and alternatives for the proposed anesthesia with the patient or authorized representative who has indicated his/her understanding and acceptance.     Dental advisory given  Plan Discussed with: CRNA  Anesthesia Plan Comments:         Anesthesia Quick Evaluation

## 2022-09-06 NOTE — Evaluation (Signed)
Physical Therapy Evaluation Patient Details Name: Danny Gross MRN: [id number] DOB: 02-04-1934 Today's Date: 09/06/2022  History of Present Illness  Pt is 88 year old presented to [facility] on  8/29 for rt THR. PMH - arthritis.  Clinical Impression  Pt admitted with above diagnosis and presents to PT with functional limitations due to deficits listed below (See PT problem list). Pt needs skilled PT to maximize independence and safety. Pt able to amb a short distance in room with assist and with shuffling gait. Expect gait will be improved next session and work toward pt returning home with spouse.      If plan is discharge home, recommend the following: A little help with walking and/or transfers;A little help with bathing/dressing/bathroom;Assistance with cooking/housework;Assist for transportation;Help with stairs or ramp for entrance   Can travel by private vehicle        Equipment Recommendations Rolling walker (2 wheels)  Recommendations for Other Services       Functional Status Assessment Patient has had a recent decline in their functional status and demonstrates the ability to make significant improvements in function in a reasonable and predictable amount of time.     Precautions / Restrictions Precautions Precautions: Fall Restrictions Weight Bearing Restrictions: Yes RLE Weight Bearing: Weight bearing as tolerated      Mobility  Bed Mobility Overal bed mobility: Needs Assistance Bed Mobility: Supine to Sit     Supine to sit: Min assist, HOB elevated     General bed mobility comments: Assist to move RLE off of bed. Incr time to perform    Transfers Overall transfer level: Needs assistance Equipment used: Rolling walker (2 wheels) Transfers: Sit to/from Stand Sit to Stand: Min assist, From elevated surface           General transfer comment: Assist to bring hips up and for balance. Verbal/tactile cues to fully extend knees and hips. Incr time to  perform    Ambulation/Gait Ambulation/Gait assistance: Min assist Gait Distance (Feet): 10 Feet Assistive device: Rolling walker (2 wheels) Gait Pattern/deviations: Step-to pattern, Decreased step length - right, Decreased step length - left, Shuffle, Knee flexed in stance - right, Knee flexed in stance - left, Trunk flexed Gait velocity: decr Gait velocity interpretation: <1.31 ft/sec, indicative of household ambulator   General Gait Details: Assist for balance and support. Verbal cues to incr step length. Pt with incr knee flexion in stance bilaterally as distance progressed.  Stairs            Wheelchair Mobility     Tilt Bed    Modified Rankin (Stroke Patients Only)       Balance Overall balance assessment: Needs assistance Sitting-balance support: Bilateral upper extremity supported, Feet supported, No upper extremity supported Sitting balance-Leahy Scale: Fair     Standing balance support: Bilateral upper extremity supported, During functional activity, Reliant on assistive device for balance Standing balance-Leahy Scale: Poor Standing balance comment: walker and min assist for static standing                             Pertinent Vitals/Pain Pain Assessment Pain Assessment: 0-10 Pain Score: 2  Pain Location: rt hip Pain Descriptors / Indicators: Guarding Pain Intervention(s): Limited activity within patient's tolerance, Premedicated before session    Home Living Family/patient expects to be discharged to:: Private residence Living Arrangements: Spouse/significant other Available Help at Discharge: Family;Available 24 hours/day Type of Home: House Home Access: Stairs to enter  Entrance Stairs-Number of Steps: 1   Home Layout: One level Home Equipment: Cane - single point      Prior Function Prior Level of Function : Independent/Modified Independent;Driving             Mobility Comments: Does ballroom dancing weekly        Extremity/Trunk Assessment   Upper Extremity Assessment Upper Extremity Assessment: Generalized weakness    Lower Extremity Assessment Lower Extremity Assessment: Generalized weakness;RLE deficits/detail RLE Deficits / Details: limited by post op pain/soreness       Communication   Communication Communication: No apparent difficulties  Cognition Arousal: Alert Behavior During Therapy: Flat affect Overall Cognitive Status: Within Functional Limits for tasks assessed                                          General Comments General comments (skin integrity, edema, etc.): VSS. BP 140's/70's. Pt denied any dizziness.    Exercises     Assessment/Plan    PT Assessment Patient needs continued PT services  PT Problem List Decreased strength;Decreased balance;Decreased mobility;Pain       PT Treatment Interventions DME instruction;Gait training;Stair training;Functional mobility training;Therapeutic activities;Therapeutic exercise;Balance training;Patient/family education    PT Goals (Current goals can be found in the Care Plan section)  Acute Rehab PT Goals Patient Stated Goal: return home PT Goal Formulation: With patient Time For Goal Achievement: 09/13/22 Potential to Achieve Goals: Good    Frequency 7X/week     Co-evaluation               AM-PAC PT "6 Clicks" Mobility  Outcome Measure Help needed turning from your back to your side while in a flat bed without using bedrails?: A Little Help needed moving from lying on your back to sitting on the side of a flat bed without using bedrails?: A Little Help needed moving to and from a bed to a chair (including a wheelchair)?: A Little Help needed standing up from a chair using your arms (e.g., wheelchair or bedside chair)?: A Little Help needed to walk in hospital room?: Total Help needed climbing 3-5 steps with a railing? : Total 6 Click Score: 14    End of Session Equipment Utilized  During Treatment: Gait belt Activity Tolerance: Patient tolerated treatment well Patient left: in chair;with call bell/phone within reach;with chair alarm set Nurse Communication: Mobility status PT Visit Diagnosis: Other abnormalities of gait and mobility (R26.89);Muscle weakness (generalized) (M62.81);Pain Pain - Right/Left: Right Pain - part of body: Hip    Time: 8119-1478 PT Time Calculation (min) (ACUTE ONLY): 29 min   Charges:   PT Evaluation $PT Eval Low Complexity: 1 Low PT Treatments $Gait Training: 8-22 mins PT General Charges $$ ACUTE PT VISIT: 1 Visit         [facility] PT Acute Rehabilitation Services Office [phone]   Angelina Ok [facility] 09/06/2022, 5:26 PM

## 2022-09-06 NOTE — Discharge Instructions (Signed)

## 2022-09-06 NOTE — Transfer of Care (Signed)
Immediate Anesthesia Transfer of Care Note  Patient: Danny Gross  Procedure(s) Performed: TOTAL HIP ARTHROPLASTY ANTERIOR APPROACH (Right: Hip)  Patient Location: PACU  Anesthesia Type:Spinal  Level of Consciousness: awake, alert , and oriented  Airway & Oxygen Therapy: Patient Spontanous Breathing  Post-op Assessment: Report given to RN and Post -op Vital signs reviewed and stable  Post vital signs: Reviewed and stable  Last Vitals:  Vitals Value Taken Time  BP 146/74 09/06/22 0911  Temp    Pulse 54 09/06/22 0914  Resp 16 09/06/22 0914  SpO2 100 % 09/06/22 0914  Vitals shown include unfiled device data.  Last Pain:  Vitals:   09/06/22 0607  TempSrc:   PainSc: 0-No pain      Patients Stated Pain Goal: 0 (09/06/22 3086)  Complications: No notable events documented.

## 2022-09-06 NOTE — Plan of Care (Signed)

## 2022-09-06 NOTE — Op Note (Signed)
TOTAL HIP ARTHROPLASTY ANTERIOR APPROACH  Procedure Note TAWHID CORSO   [id number]  Pre-op Diagnosis: right hip osteoarthritis     Post-op Diagnosis: same  Operative Findings Leg length discrepancy Complete loss of articular cartilage   Operative Procedures  1. Total hip replacement; Right hip; uncemented [id number]   Surgeon: Gershon Mussel, M.D.  Assist: Oneal Grout, PA-C   Anesthesia: spinal  Prosthesis: Depuy Acetabulum: Pinnacle 58 mm Femur: Actis 6 HO Head: 40 mm size: +1.5 Liner: +4 Bearing Type: metal/poly  Total Hip Arthroplasty (Anterior Approach) Op Note:  After informed consent was obtained and the operative extremity marked in the holding area, the patient was brought back to the operating room and placed supine on the HANA table. Next, the operative extremity was prepped and draped in normal sterile fashion. Surgical timeout occurred verifying patient identification, surgical site, surgical procedure and administration of antibiotics.  A 10 cm longitudinal incision was made starting from 2 fingerbreadths lateral and inferior to the ASIS towards the lateral aspect of the patella.  A Hueter approach to the hip was performed, using the interval between tensor fascia lata and sartorius.  Dissection was carried bluntly down onto the anterior hip capsule. The lateral femoral circumflex vessels were identified and coagulated. A capsulotomy was performed and the capsular flaps tagged for later repair.  The neck osteotomy was performed. The femoral head was removed which showed severe wear, the acetabular rim was cleared of soft tissue and osteophytes and attention was turned to reaming the acetabulum.  Sequential reaming was performed under fluoroscopic guidance down to the floor of the cotyloid fossa. We reamed to a size 58 mm, and then impacted the acetabular shell. A 25 mm cancellous screw was placed to secure the shell.  The liner was then placed after irrigation  and attention turned to the femur.  After placing the femoral hook, the leg was taken to externally rotated, extended and adducted position taking care to perform soft tissue releases to allow for adequate mobilization of the femur. Soft tissue was cleared from the shoulder of the greater trochanter and the hook elevator used to improve exposure of the proximal femur. Sequential broaching performed up to a size 6. Trial neck and head were placed. The leg was brought back up to neutral and the construct reduced.  Antibiotic irrigation was placed in the surgical wound.  The position and sizing of components, offset and leg lengths were checked using fluoroscopy.  High offset neck was found to be the best for restoration of leg length and offset.  Stability of the construct was checked in extension and external rotation without any subluxation, shuck or impingement of prosthesis. We dislocated the prosthesis, dropped the leg back into position, removed trial components, and irrigated copiously. The final stem and head was then placed, the leg brought back up, the system reduced and fluoroscopy used to verify positioning.  We irrigated, obtained hemostasis and closed the capsule using #2 ethibond suture.  One gram of vancomycin powder was placed in the surgical bed.   One gram of topical tranexamic acid was injected into the joint.  The fascia was closed with #1 vicryl plus, the deep fat layer was closed with 0 vicryl, the subcutaneous layers closed with 2.0 Vicryl Plus and the skin closed with 2.0 nylon and dermabond. A sterile dressing was applied. The patient was awakened in the operating room and taken to recovery in stable condition.  All sponge, needle, and instrument counts were correct at  the end of the case.   Tessa Lerner, my PA, was a medical necessity for opening, closing, limb positioning, retracting, exposing, and overall facilitation and timely completion of the surgery.  Position: supine   Complications: see description of procedure.  Time Out: performed   Drains/Packing: none  Estimated blood loss: see anesthesia record  Returned to Recovery Room: in good condition.   Antibiotics: yes   Mechanical VTE (DVT) Prophylaxis: sequential compression devices, TED thigh-high  Chemical VTE (DVT) Prophylaxis: aspirin   Fluid Replacement: see anesthesia record  Specimens Removed: 1 to pathology   Sponge and Instrument Count Correct? yes   PACU: portable radiograph - low AP   Plan/RTC: Return in 2 weeks for staple removal. Weight Bearing/Load Lower Extremity: full  Hip precautions: none Suture Removal: 2 weeks   N. Glee Arvin, MD [facility] 8:45 AM   Implant Name Type Inv. Item Serial No. Manufacturer Lot No. LRB No. Used Action  SCREW 6.5MMX25MM - [id number] Screw SCREW 6.5MMX25MM  [employer] [id number] Right 1 Implanted  ALtrx ld polyethlene acetabular liner    SYNTHES [id number] Right 1 Implanted  CUP SECTOR GRIPTON - [id number] Orthopedic Implant CUP SECTOR GRIPTON  [employer] [id number] Right 1 Implanted  FEM HEAD 1.5 OFFSET EZE M - [id number] Orthopedic Implant FEM HEAD 1.5 OFFSET EZE [employer] [id number] Right 1 Implanted  STEM FEMORAL SZ6 HIGH ACTIS - [id number] Stem STEM FEMORAL SZ6 HIGH ACTIS  [employer] [id number] Right 1 Implanted

## 2022-09-07 ENCOUNTER — Telehealth: Payer: Self-pay | Admitting: *Deleted

## 2022-09-07 ENCOUNTER — Encounter (HOSPITAL_COMMUNITY): Payer: Self-pay | Admitting: Orthopaedic Surgery

## 2022-09-07 ENCOUNTER — Other Ambulatory Visit: Payer: Self-pay | Admitting: Physician Assistant

## 2022-09-07 DIAGNOSIS — M160 Bilateral primary osteoarthritis of hip: Secondary | ICD-10-CM | POA: Diagnosis not present

## 2022-09-07 DIAGNOSIS — Z7982 Long term (current) use of aspirin: Secondary | ICD-10-CM | POA: Diagnosis not present

## 2022-09-07 DIAGNOSIS — I10 Essential (primary) hypertension: Secondary | ICD-10-CM | POA: Diagnosis not present

## 2022-09-07 DIAGNOSIS — M1611 Unilateral primary osteoarthritis, right hip: Secondary | ICD-10-CM | POA: Diagnosis not present

## 2022-09-07 DIAGNOSIS — Z79899 Other long term (current) drug therapy: Secondary | ICD-10-CM | POA: Diagnosis not present

## 2022-09-07 LAB — CBC
HCT: 37.1 % — ABNORMAL LOW (ref 39.0–52.0)
Hemoglobin: 12.2 g/dL — ABNORMAL LOW (ref 13.0–17.0)
MCH: 29 pg (ref 26.0–34.0)
MCHC: 32.9 g/dL (ref 30.0–36.0)
MCV: 88.3 fL (ref 80.0–100.0)
Platelets: 112 10*3/uL — ABNORMAL LOW (ref 150–400)
RBC: 4.2 MIL/uL — ABNORMAL LOW (ref 4.22–5.81)
RDW: 13.2 % (ref 11.5–15.5)
WBC: 10.4 10*3/uL (ref 4.0–10.5)
nRBC: 0 % (ref 0.0–0.2)

## 2022-09-07 MED ORDER — TRAMADOL HCL 50 MG PO TABS
50.0000 mg | ORAL_TABLET | Freq: Two times a day (BID) | ORAL | 0 refills | Status: DC | PRN
Start: 2022-09-07 — End: 2022-10-19

## 2022-09-07 NOTE — Discharge Summary (Addendum)
Patient ID: Danny Gross MRN: [id number] DOB/AGE: April 02, 1934 88 y.o.  Admit date: 09/06/2022 Discharge date: 09/07/2022  Admission Diagnoses:  Principal Problem:   Primary osteoarthritis of right hip   Discharge Diagnoses:  Same  Past Medical History:  Diagnosis Date   Arthritis    Cataract    Emphysema of lung (HCC)    Hypertension    Pneumonia 2015    Surgeries: Procedure(s): TOTAL HIP ARTHROPLASTY ANTERIOR APPROACH on 09/06/2022   Consultants:   Discharged Condition: Improved  Hospital Course: Danny Gross is an 88 y.o. male who was admitted 09/06/2022 for operative treatment ofPrimary osteoarthritis of right hip. Patient has severe unremitting pain that affects sleep, daily activities, and work/hobbies. After pre-op clearance the patient was taken to the operating room on 09/06/2022 and underwent  Procedure(s): TOTAL HIP ARTHROPLASTY ANTERIOR APPROACH.    Patient was given perioperative antibiotics:  Anti-infectives (From admission, onward)    Start     Dose/Rate Route Frequency Ordered Stop   09/06/22 1400  ceFAZolin (ANCEF) IVPB 2g/100 mL premix        2 g 200 mL/hr over 30 Minutes Intravenous Every 6 hours 09/06/22 1009 09/06/22 2300   09/06/22 0755  vancomycin (VANCOCIN) powder  Status:  Discontinued          As needed 09/06/22 0756 09/06/22 0908   09/06/22 0600  ceFAZolin (ANCEF) IVPB 2g/100 mL premix        2 g 200 mL/hr over 30 Minutes Intravenous On call to O.R. 09/06/22 2542 09/06/22 0727        Patient was given sequential compression devices, early ambulation, and chemoprophylaxis to prevent DVT.  Patient benefited maximally from hospital stay and there were no complications.    Recent vital signs: Patient Vitals for the past 24 hrs:  BP Temp Temp src Pulse Resp SpO2  09/07/22 0809 (!) 160/71 -- -- 70 17 94 %  09/07/22 0546 (!) 165/83 98.7 F (37.1 C) Oral 69 18 92 %  09/07/22 0008 137/73 98.7 F (37.1 C) Oral 66 18 95 %  09/06/22 1933 (!)  147/65 97.8 F (36.6 C) Oral 66 18 95 %  09/06/22 1703 (!) 141/76 98.2 F (36.8 C) Oral 62 15 95 %     Recent laboratory studies:  Recent Labs    09/07/22 0349  WBC 10.4  HGB 12.2*  HCT 37.1*  PLT 112*     Discharge Medications:   Allergies as of 09/07/2022   No Known Allergies      Medication List     STOP taking these medications    ibuprofen 200 MG tablet Commonly known as: ADVIL   oxyCODONE-acetaminophen 5-325 MG tablet Commonly known as: Percocet       TAKE these medications    amLODipine 2.5 MG tablet Commonly known as: NORVASC Take 1 tablet (2.5 mg total) by mouth daily.   aspirin EC 81 MG tablet Take 1 tablet (81 mg total) by mouth 2 (two) times daily. To be taken after surgery to prevent blood clots   brimonidine-timolol 0.2-0.5 % ophthalmic solution Commonly known as: COMBIGAN Place 1 drop into both eyes every 12 (twelve) hours.   docusate sodium 100 MG capsule Commonly known as: Colace Take 1 capsule (100 mg total) by mouth daily as needed.   dorzolamide 2 % ophthalmic solution Commonly known as: TRUSOPT Place 1 drop into both eyes 2 (two) times daily.   guaiFENesin 600 MG 12 hr tablet Commonly known as: MUCINEX Take 600 mg  by mouth 2 (two) times daily.   methocarbamol 750 MG tablet Commonly known as: Robaxin-750 Take 1 tablet (750 mg total) by mouth 2 (two) times daily as needed for muscle spasms.   ondansetron 4 MG tablet Commonly known as: Zofran Take 1 tablet (4 mg total) by mouth every 8 (eight) hours as needed for nausea or vomiting.   Rocklatan 0.02-0.005 % Soln Generic drug: Netarsudil-Latanoprost Place 1 drop into both eyes at bedtime.   traMADol 50 MG tablet Commonly known as: ULTRAM Take 1-2 tablets (50-100 mg total) by mouth every 12 (twelve) hours as needed.               Durable Medical Equipment  (From admission, onward)           Start     Ordered   09/07/22 1034  For home use only DME Walker  rolling  Once       Question Answer Comment  Walker: With 5 Inch Wheels   Patient needs a walker to treat with the following condition Gait instability      09/07/22 1034   09/07/22 1008  For home use only DME Bedside commode  Once       Comments: Pt is status post Rt Total Hip Arthroplasty, confined to a single room with Generalized Weakness and decreased activity tolerance which necessitate recommendation for bedside commode as she is not able to ambulate to the bathroom.  Question:  Patient needs a bedside commode to treat with the following condition  Answer:  Weakness   09/07/22 1034   09/06/22 1010  DME Walker rolling  Once       Question:  Patient needs a walker to treat with the following condition  Answer:  History of hip replacement   09/06/22 1009   09/06/22 1010  DME 3 n 1  Once        09/06/22 1009   09/06/22 1010  DME Bedside commode  Once       Question:  Patient needs a bedside commode to treat with the following condition  Answer:  History of hip replacement   09/06/22 1009            Diagnostic Studies: DG Pelvis Portable  Result Date: 09/06/2022 CLINICAL DATA:  Status post right hip arthroplasty. EXAM: PORTABLE PELVIS 1-2 VIEWS COMPARISON:  Pelvis and right hip radiographs 08/21/2022 FINDINGS: Frontal view of the bilateral hips. Interval total right hip arthroplasty. No perihardware lucency is seen to indicate hardware failure or loosening. Expected postoperative changes of lateral greater than medial right hip and thigh subcutaneous air. Moderate left femoroacetabular joint space narrowing and peripheral osteophytosis. Mild superior pubic symphysis osteophytosis. No acute fracture or dislocation. IMPRESSION: Interval total right hip arthroplasty without evidence of hardware failure or loosening. Electronically Signed   By: Neita Garnet M.D.   On: 09/06/2022 12:03   DG HIP UNILAT WITH PELVIS 2-3 VIEWS RIGHT  Result Date: 09/06/2022 CLINICAL DATA:  Anterior right hip  arthroplasty. Intraoperative fluoroscopy. EXAM: DG HIP (WITH OR WITHOUT PELVIS) 2-3V RIGHT COMPARISON:  Pelvis and right hip radiographs 08/21/2022 FINDINGS: Images were performed intraoperatively without the presence of a radiologist. Severe right femoroacetabular osteoarthritis. The patient is undergoing new total right hip arthroplasty. No hardware complication is seen. Moderate left femoroacetabular osteoarthritis. Total fluoroscopy images: 4 Total fluoroscopy time: 31 seconds Total dose: Radiation Exposure Index (as provided by the fluoroscopic device): 2.86 mGy air Kerma Please see intraoperative findings for further detail. IMPRESSION: Intraoperative fluoroscopy  for new total right hip arthroplasty. Electronically Signed   By: Neita Garnet M.D.   On: 09/06/2022 11:56   DG C-Arm 1-60 Min-No Report  Result Date: 09/06/2022 Fluoroscopy was utilized by the requesting physician.  No radiographic interpretation.   DG C-Arm 1-60 Min-No Report  Result Date: 09/06/2022 Fluoroscopy was utilized by the requesting physician.  No radiographic interpretation.   XR HIP UNILAT W OR W/O PELVIS 2-3 VIEWS RIGHT  Result Date: 08/28/2022 Advanced degenerative joint disease with bone-on-bone joint space narrowing of the right hip.  US Abdomen Limited RUQ (LIVER/GB)  Result Date: 08/21/2022 CLINICAL DATA:  Assess for liver cirrhosis. EXAM: ULTRASOUND ABDOMEN LIMITED RIGHT UPPER QUADRANT COMPARISON:  August 03, 2020, chest CT August 15, 2022 FINDINGS: Gallbladder: Gallstones sludge are identified, largest measures 1.4 cm. No wall thickening visualized. No sonographic Murphy sign noted by sonographer. Common bile duct: Diameter: 2.4 mm. Liver: No focal lesion identified. Mild coarsened echotexture of the liver. Portal vein is patent on color Doppler imaging with normal direction of blood flow towards the liver. Other: None. IMPRESSION: 1. Mild coarsened echotexture of the liver. This is nonspecific. No focal liver  lesion identified. 2. Cholelithiasis without sonographic evidence of acute cholecystitis. Electronically Signed   By: Sherian Rein M.D.   On: 08/21/2022 09:20   CT Chest Wo Contrast  Result Date: 08/15/2022 CLINICAL DATA:  Follow up ascending thoracic aortic aneurysm. EXAM: CT CHEST WITHOUT CONTRAST TECHNIQUE: Multidetector CT imaging of the chest was performed following the standard protocol without IV contrast. RADIATION DOSE REDUCTION: This exam was performed according to the departmental dose-optimization program which includes automated exposure control, adjustment of the mA and/or kV according to patient size and/or use of iterative reconstruction technique. COMPARISON:  Chest CT 05/29/2021 and CTA 11/30/2019. FINDINGS: Cardiovascular: Stable mild dilatation of the ascending aorta which has a maximal diameter of 4.3 cm. Stable mild aortic atherosclerosis. No significant coronary artery atherosclerosis. There are calcifications of the aortic valve. The heart size is normal. There is no pericardial effusion. Mediastinum/Nodes: There are no enlarged mediastinal, hilar or axillary lymph nodes.Hilar assessment is limited by the lack of intravenous contrast, although the hilar contours appear unchanged. The thyroid gland, trachea and esophagus demonstrate no significant findings. Lungs/Pleura: No pleural effusion or pneumothorax. Similar chronic lung disease with bronchiectasis, mucoid impaction and scattered parenchymal scarring. The overall aeration of the left lower lobe has improved from the previous study. There is a stable calcified granuloma near the left major fissure. No suspicious pulmonary nodule or confluent airspace disease. Upper abdomen: Possible cirrhotic changes within the liver. Unchanged 3.9 cm cyst in the interpolar region of the left kidney for which no follow-up imaging is recommended. No acute findings are seen within the upper abdomen. Musculoskeletal/Chest wall: There is no chest wall  mass or suspicious osseous finding. Unless specific follow-up recommendations are mentioned in the findings or impression sections, no imaging follow-up of any mentioned incidental findings is recommended. IMPRESSION: 1. Stable mild dilatation of the ascending aorta which has a maximal diameter of 4.3 cm. 2. Stable chronic lung disease with bronchiectasis, mucoid impaction and scattered parenchymal scarring. The overall aeration of the left lower lobe has improved from the previous study. 3. No acute chest findings. 4. Possible cirrhotic changes within the liver. Correlate with liver function studies. Electronically Signed   By: Carey Bullocks M.D.   On: 08/15/2022 12:36    Disposition: Discharge disposition: 01-Home or Self Care  Follow-up Information     Cristie Hem, PA-C. Schedule an appointment as soon as possible for a visit in 2 week(s).   Specialty: Orthopedic Surgery Contact information: [address] [place] [address] [phone]         [facility] Follow up.   Why: Someone from [facility] will contact you to arrange start date and time for your physical therapy. Contact information: [address] [place] [address] [phone]                  Signed: Cristie Hem 09/07/2022, 10:47 AM

## 2022-09-07 NOTE — Progress Notes (Cosign Needed)
    Durable Medical Equipment  (From admission, onward)           Start     Ordered   09/07/22 1034  For home use only DME Walker rolling  Once       Question Answer Comment  Walker: With 5 Inch Wheels   Patient needs a walker to treat with the following condition Gait instability      09/07/22 1034   09/07/22 1008  For home use only DME Bedside commode  Once       Comments: Pt is status post Rt Total Hip Arthroplasty, confined to a single room with Generalized Weakness and decreased activity tolerance which necessitate recommendation for bedside commode as she is not able to ambulate to the bathroom.  Question:  Patient needs a bedside commode to treat with the following condition  Answer:  Weakness   09/07/22 1034   09/06/22 1010  DME Walker rolling  Once       Question:  Patient needs a walker to treat with the following condition  Answer:  History of hip replacement   09/06/22 1009   09/06/22 1010  DME 3 n 1  Once        09/06/22 1009   09/06/22 1010  DME Bedside commode  Once       Question:  Patient needs a bedside commode to treat with the following condition  Answer:  History of hip replacement   09/06/22 1009

## 2022-09-07 NOTE — Progress Notes (Signed)
Physical Therapy Treatment Patient Details Name: Danny Gross MRN: [id number] DOB: 04/21/1934 Today's Date: 09/07/2022   History of Present Illness Pt is 88 year old presented to [facility] on  8/29 for rt THR. PMH - arthritis.    PT Comments  The pt was able to make good progress with transfers, ambulation distance, and completed stair training. He and his wife were given handout for LE ROM and strengthening exercises, and expressed understanding. The pt will continue to benefit from skilled PT to address gait deficits such as decreased stride length, toe clearance, and reduce dependence on BUE support. The pt and his wife expressed understanding of all education, are safe to return home when medically ready.     If plan is discharge home, recommend the following: A little help with walking and/or transfers;A little help with bathing/dressing/bathroom;Assistance with cooking/housework;Assist for transportation;Help with stairs or ramp for entrance   Can travel by private vehicle        Equipment Recommendations  Rolling walker (2 wheels)    Recommendations for Other Services       Precautions / Restrictions Precautions Precautions: Fall Precaution Comments: hip exercises packet given Restrictions Weight Bearing Restrictions: Yes RLE Weight Bearing: Weight bearing as tolerated     Mobility  Bed Mobility Overal bed mobility: Needs Assistance Bed Mobility: Supine to Sit     Supine to sit: Min assist, HOB elevated     General bed mobility comments: Assist to move RLE off of bed. Incr time to perform    Transfers Overall transfer level: Needs assistance Equipment used: Rolling walker (2 wheels) Transfers: Sit to/from Stand Sit to Stand: Min assist           General transfer comment: minA from low surface, supervision with armrests or from elevated surface    Ambulation/Gait Ambulation/Gait assistance: Contact guard assist Gait Distance (Feet): 30 Feet (+ 50  ft) Assistive device: Rolling walker (2 wheels) Gait Pattern/deviations: Step-to pattern, Decreased step length - right, Decreased step length - left, Shuffle, Knee flexed in stance - right, Knee flexed in stance - left, Trunk flexed Gait velocity: decr Gait velocity interpretation: <1.31 ft/sec, indicative of household ambulator   General Gait Details: Assist for balance and support. Verbal cues to incr step length. Pt with incr knee flexion in stance bilaterally as distance progressed.   Stairs Stairs: Yes Stairs assistance: Min assist Stair Management: No rails, Forwards, With walker Number of Stairs: 1 General stair comments: discussed RW technique, pt and his wif eagreeable   Wheelchair Mobility     Tilt Bed    Modified Rankin (Stroke Patients Only)       Balance Overall balance assessment: Needs assistance Sitting-balance support: Bilateral upper extremity supported, Feet supported, No upper extremity supported Sitting balance-Leahy Scale: Fair     Standing balance support: Bilateral upper extremity supported, During functional activity, Reliant on assistive device for balance Standing balance-Leahy Scale: Poor Standing balance comment: walker and min assist for static standing                            Cognition Arousal: Alert Behavior During Therapy: Flat affect Overall Cognitive Status: Within Functional Limits for tasks assessed                                          Exercises Total Joint Exercises  Ankle Circles/Pumps: AROM, Both, 10 reps, Supine Long Arc Quad: AROM, Both, 10 reps, Seated    General Comments General comments (skin integrity, edema, etc.): VSS on RA      Pertinent Vitals/Pain Pain Assessment Pain Assessment: Faces Pain Score: 2  Pain Location: R hip with movement Pain Descriptors / Indicators: Guarding Pain Intervention(s): Limited activity within patient's tolerance, Monitored during session,  Repositioned    Home Living                          Prior Function            PT Goals (current goals can now be found in the care plan section) Acute Rehab PT Goals Patient Stated Goal: return home PT Goal Formulation: With patient Time For Goal Achievement: 09/13/22 Potential to Achieve Goals: Good Progress towards PT goals: Progressing toward goals    Frequency    7X/week      PT Plan      Co-evaluation              AM-PAC PT "6 Clicks" Mobility   Outcome Measure  Help needed turning from your back to your side while in a flat bed without using bedrails?: A Little Help needed moving from lying on your back to sitting on the side of a flat bed without using bedrails?: A Little Help needed moving to and from a bed to a chair (including a wheelchair)?: A Little Help needed standing up from a chair using your arms (e.g., wheelchair or bedside chair)?: A Little Help needed to walk in hospital room?: Total Help needed climbing 3-5 steps with a railing? : Total 6 Click Score: 14    End of Session Equipment Utilized During Treatment: Gait belt Activity Tolerance: Patient tolerated treatment well Patient left: in chair;with call bell/phone within reach;with chair alarm set Nurse Communication: Mobility status PT Visit Diagnosis: Other abnormalities of gait and mobility (R26.89);Muscle weakness (generalized) (M62.81);Pain Pain - Right/Left: Right Pain - part of body: Hip     Time: 0921-1001 PT Time Calculation (min) (ACUTE ONLY): 40 min  Charges:    $Gait Training: 8-22 mins $Therapeutic Exercise: 8-22 mins $Self Care/Home Management: 8-22 PT General Charges $$ ACUTE PT VISIT: 1 Visit                     Vickki Muff, PT, DPT   Acute Rehabilitation Department Office [phone] Secure Chat Communication Preferred   Ronnie Derby 09/07/2022, 12:16 PM

## 2022-09-07 NOTE — Anesthesia Postprocedure Evaluation (Signed)
Anesthesia Post Note  Patient: [name]  Procedure(s) Performed: TOTAL HIP ARTHROPLASTY ANTERIOR APPROACH (Right: Hip)     Patient location during evaluation: PACU Anesthesia Type: Spinal and MAC Level of consciousness: awake and alert Pain management: pain level controlled Vital Signs Assessment: post-procedure vital signs reviewed and stable Respiratory status: spontaneous breathing, nonlabored ventilation and respiratory function stable Cardiovascular status: stable and blood pressure returned to baseline Postop Assessment: no apparent nausea or vomiting Anesthetic complications: no   No notable events documented.  Last Vitals:  Vitals:   09/07/22 0546 09/07/22 0809  BP: (!) 165/83 (!) 160/71  Pulse: 69 70  Resp: 18 17  Temp: 37.1 C   SpO2: 92% 94%    Last Pain:  Vitals:   09/07/22 1000  TempSrc:   PainSc: 0-No pain                 Simona Rocque

## 2022-09-07 NOTE — Progress Notes (Signed)
PT Cancellation Note  Patient Details Name: Danny Gross MRN: [id number] DOB: 12-May-1934   Cancelled Treatment:    Reason Eval/Treat Not Completed: Other (comment) attempted to see before planned d/c to progress walking and complete stair training. Pt eating breakfast upon arrival and asking PT to return later in morning. Will return later this morning to attempt training.   Vickki Muff, PT, DPT   Acute Rehabilitation Department Office [phone] Secure Chat Communication Preferred   Danny Gross 09/07/2022, 8:32 AM

## 2022-09-07 NOTE — Telephone Encounter (Signed)
Ortho bundle D/C call completed. 

## 2022-09-07 NOTE — TOC Transition Note (Signed)
Transition of Care [facility]) - CM/SW Discharge Note   Patient Details  Name: Danny Gross MRN: [id number] Date of Birth: September 05, 1934  [facility]) CM/SW Contact:  Tom-Johnson, Hershal Coria, RN Phone Number: 09/07/2022, 10:45 AM   Clinical Narrative:        Patient is scheduled for discharge today.  Home Health info, hospital f/u and discharge instructions on AVS. BSC and RW ordered from [employer] and Earna Coder to deliver to patient at bedside.  Daughter, Jeani Hawking to transport at discharge.  No further TOC needs noted.         Final next level of care: Home w Home Health Services Barriers to Discharge: Barriers Resolved   Patient Goals and CMS Choice CMS Medicare.gov Compare Post Acute Care list provided to:: Patient Choice offered to / list presented to : Patient  Discharge Placement                  Patient to be transferred to facility by: Daughter Name of family member notified: Jordana    Discharge Plan and Services Additional resources added to the After Visit Summary for                  DME Arranged: Bedside commode, Walker rolling DME Agency: [employer] Date DME Agency Contacted: 09/07/22 Time DME Agency Contacted: 1025 Representative spoke with at DME Agency: Earna Coder HH Arranged: PT HH Agency: [facility]        Social Determinants of Health (SDOH) Interventions SDOH Screenings   Food Insecurity: No Food Insecurity (09/06/2022)  Housing: Low Risk  (09/06/2022)  Transportation Needs: No Transportation Needs (09/06/2022)  Utilities: Not At Risk (09/06/2022)  Alcohol Screen: Low Risk  (08/08/2022)  Depression (PHQ2-9): Low Risk  (03/08/2022)  Financial Resource Strain: Low Risk  (08/08/2022)  Physical Activity: Unknown (08/08/2022)  Social Connections: Socially Integrated (08/08/2022)  Stress: No Stress Concern Present (08/08/2022)  Tobacco Use: Low Risk  (09/06/2022)     Readmission Risk Interventions     No data to display

## 2022-09-07 NOTE — Progress Notes (Signed)
Subjective: 1 Day Post-Op Procedure(s) (LRB): TOTAL HIP ARTHROPLASTY ANTERIOR APPROACH (Right) Patient reports pain as mild.  Some nausea/vomiting yesterday, but none today.  Feeling better.   Objective: Vital signs in last 24 hours: Temp:  [96.1 F (35.6 C)-98.7 F (37.1 C)] 98.7 F (37.1 C) (08/30 0546) Pulse Rate:  [45-69] 69 (08/30 0546) Resp:  [12-18] 18 (08/30 0546) BP: (137-177)/(65-83) 165/83 (08/30 0546) SpO2:  [92 %-99 %] 92 % (08/30 0546)  Intake/Output from previous day: 08/29 0701 - 08/30 0700 In: 2166.8 [P.O.:650; I.V.:1316.8; IV Piggyback:200] Out: 651 [Urine:450; Emesis/NG output:1; Blood:200] Intake/Output this shift: No intake/output data recorded.  Recent Labs    09/07/22 0349  HGB 12.2*   Recent Labs    09/07/22 0349  WBC 10.4  RBC 4.20*  HCT 37.1*  PLT 112*   No results for input(s): "NA", "K", "CL", "CO2", "BUN", "CREATININE", "GLUCOSE", "CALCIUM" in the last 72 hours. No results for input(s): "LABPT", "INR" in the last 72 hours.  Neurologically intact Neurovascular intact Sensation intact distally Intact pulses distally Dorsiflexion/Plantar flexion intact Incision: dressing C/D/I No cellulitis present Compartment soft   Assessment/Plan: 1 Day Post-Op Procedure(s) (LRB): TOTAL HIP ARTHROPLASTY ANTERIOR APPROACH (Right) Advance diet Up with therapy D/C IV fluids Discharge home with home health once able to urinate on his own and has been cleared by PT WBAT RLE ABLA- mild and stable      Cristie Hem 09/07/2022, 7:23 AM

## 2022-09-07 NOTE — Progress Notes (Signed)
Discharge instructions given. Patient verbalized understanding and all questions were answered. Awaiting for RW and BS to be delivered to him.

## 2022-09-08 DIAGNOSIS — Z96641 Presence of right artificial hip joint: Secondary | ICD-10-CM | POA: Diagnosis not present

## 2022-09-08 DIAGNOSIS — Z471 Aftercare following joint replacement surgery: Secondary | ICD-10-CM | POA: Diagnosis not present

## 2022-09-11 ENCOUNTER — Encounter: Payer: Medicare PPO | Admitting: Physical Therapy

## 2022-09-12 DIAGNOSIS — Z471 Aftercare following joint replacement surgery: Secondary | ICD-10-CM | POA: Diagnosis not present

## 2022-09-12 DIAGNOSIS — Z96641 Presence of right artificial hip joint: Secondary | ICD-10-CM | POA: Diagnosis not present

## 2022-09-14 DIAGNOSIS — Z96641 Presence of right artificial hip joint: Secondary | ICD-10-CM | POA: Diagnosis not present

## 2022-09-14 DIAGNOSIS — Z471 Aftercare following joint replacement surgery: Secondary | ICD-10-CM | POA: Diagnosis not present

## 2022-09-17 DIAGNOSIS — Z96641 Presence of right artificial hip joint: Secondary | ICD-10-CM | POA: Diagnosis not present

## 2022-09-17 DIAGNOSIS — Z471 Aftercare following joint replacement surgery: Secondary | ICD-10-CM | POA: Diagnosis not present

## 2022-09-19 ENCOUNTER — Telehealth: Payer: Self-pay | Admitting: *Deleted

## 2022-09-19 DIAGNOSIS — Z96641 Presence of right artificial hip joint: Secondary | ICD-10-CM | POA: Diagnosis not present

## 2022-09-19 DIAGNOSIS — Z471 Aftercare following joint replacement surgery: Secondary | ICD-10-CM | POA: Diagnosis not present

## 2022-09-19 NOTE — Telephone Encounter (Signed)
Ok, thanks.

## 2022-09-19 NOTE — Telephone Encounter (Signed)
Ok, thanks.  Should I put in order for PT or have  you already?

## 2022-09-19 NOTE — Telephone Encounter (Signed)
Ortho bundle call to patient. Doing very well overall. Will be coming in Friday for first post op visit. States HHPT has recommended additional OPPT for hip at where he was going before. I emphasized most likely didn't need many additional visits at [facility] location (I'd say 5-6). Made aware that walking is best medicine for rehab for hip. Just an FYI since I won't be here Friday. Thanks.

## 2022-09-21 ENCOUNTER — Ambulatory Visit (INDEPENDENT_AMBULATORY_CARE_PROVIDER_SITE_OTHER): Payer: Medicare PPO | Admitting: Physician Assistant

## 2022-09-21 DIAGNOSIS — Z96641 Presence of right artificial hip joint: Secondary | ICD-10-CM

## 2022-09-21 NOTE — Progress Notes (Signed)
Post-Op Visit Note   Patient: Danny Gross           Date of Birth: 15-May-1934           MRN: [id number] Visit Date: 09/21/2022 PCP: Myrlene Broker, MD   Assessment & Plan:  Chief Complaint: No chief complaint on file.  Visit Diagnoses:  1. History of total hip replacement, right     Plan: Patient is a very pleasant 88 year old gentleman who comes in today approximately 2 weeks status post right total hip replacement 09/06/2022.  He has been doing well.  He has been taking ibuprofen and Robaxin for pain.  He has been compliant taking baby aspirin twice daily for DVT prophylaxis.  He has been getting home health physical therapy and is ambulating with a walker.  Examination of the right hip reveals a well-healed surgical incision with nylon sutures in place.  No evidence of infection or cellulitis.  Calves are soft nontender.  He is neurovascular intact distally.  Today, sutures were removed and Steri-Strips applied.  He feels he needs additional physical therapy staff put in an outpatient order.  Postoperative instructions provided.  Follow-up with [place] in 4 weeks for repeat evaluation and AP pelvis x-rays.  Call with concerns or questions.  Follow-Up Instructions: Return in about 4 weeks (around 10/19/2022).   Orders:  No orders of the defined types were placed in this encounter.  No orders of the defined types were placed in this encounter.   Imaging: No new imaging  PMFS History: Patient Active Problem List   Diagnosis Date Noted   Primary osteoarthritis of right hip 09/06/2022   Essential hypertension 08/14/2022   Pre-op examination 08/14/2022   Bilateral hip joint arthritis 03/13/2022   Myalgia 11/28/2021   Thoracic aortic aneurysm (HCC) 06/07/2021   Aortic atherosclerosis (HCC) 07/19/2020   Bronchiectasis without complication (HCC) 04/22/2018   Impaired fasting glucose 10/04/2014   Routine general medical examination at a health care facility 12/21/2013    Pulmonary infiltrates 07/13/2013   Past Medical History:  Diagnosis Date   Arthritis    Cataract    Emphysema of lung (HCC)    Hypertension    Pneumonia 2015    Family History  Problem Relation Age of Onset   Cancer Maternal Aunt        ? type     Past Surgical History:  Procedure Laterality Date   HERNIA REPAIR  2003   TONSILLECTOMY  1939   TOTAL HIP ARTHROPLASTY Right 09/06/2022   Procedure: TOTAL HIP ARTHROPLASTY ANTERIOR APPROACH;  Surgeon: Tarry Kos, MD;  Location: [facility];  Service: Orthopedics;  Laterality: Right;  3-C   VIDEO BRONCHOSCOPY Bilateral 08/11/2013   Procedure: VIDEO BRONCHOSCOPY WITH FLUORO;  Surgeon: Nyoka Cowden, MD;  Location: [facility];  Service: Cardiopulmonary;  Laterality: Bilateral;   Social History   Occupational History   Occupation: Retired  Tobacco Use   Smoking status: Never   Smokeless tobacco: Never  Vaping Use   Vaping status: Never Used  Substance and Sexual Activity   Alcohol use: Yes    Alcohol/week: 8.0 standard drinks of alcohol    Types: 1 Cans of beer, 7 Standard drinks or equivalent per week    Comment: cocktail daily   Drug use: No   Sexual activity: Not Currently

## 2022-09-24 DIAGNOSIS — Z471 Aftercare following joint replacement surgery: Secondary | ICD-10-CM | POA: Diagnosis not present

## 2022-09-24 DIAGNOSIS — Z96641 Presence of right artificial hip joint: Secondary | ICD-10-CM | POA: Diagnosis not present

## 2022-09-26 NOTE — Therapy (Unsigned)
OUTPATIENT PHYSICAL THERAPY LOWER EXTREMITY EVALUATION   Patient Name: Danny Gross MRN: [id number] DOB:Apr 07, 1934, 88 y.o., male Today's Date: 09/27/2022  END OF SESSION:  PT End of Session - 09/27/22 1104     Visit Number 1    Number of Visits 16    Date for PT Re-Evaluation 12/20/22    Authorization Type humana - auth 12 visits from 09/27/22 -12/08/22    Authorization - Visit Number 1    Authorization - Number of Visits 12    PT Start Time 1104    PT Stop Time 1147    PT Time Calculation (min) 43 min             Past Medical History:  Diagnosis Date   Arthritis    Cataract    Emphysema of lung (HCC)    Hypertension    Pneumonia 2015   Past Surgical History:  Procedure Laterality Date   HERNIA REPAIR  2003   TONSILLECTOMY  1939   TOTAL HIP ARTHROPLASTY Right 09/06/2022   Procedure: TOTAL HIP ARTHROPLASTY ANTERIOR APPROACH;  Surgeon: Tarry Kos, MD;  Location: [facility];  Service: Orthopedics;  Laterality: Right;  3-C   VIDEO BRONCHOSCOPY Bilateral 08/11/2013   Procedure: VIDEO BRONCHOSCOPY WITH FLUORO;  Surgeon: Nyoka Cowden, MD;  Location: [facility];  Service: Cardiopulmonary;  Laterality: Bilateral;   Patient Active Problem List   Diagnosis Date Noted   Primary osteoarthritis of right hip 09/06/2022   Essential hypertension 08/14/2022   Pre-op examination 08/14/2022   Bilateral hip joint arthritis 03/13/2022   Myalgia 11/28/2021   Thoracic aortic aneurysm (HCC) 06/07/2021   Aortic atherosclerosis (HCC) 07/19/2020   Bronchiectasis without complication (HCC) 04/22/2018   Impaired fasting glucose 10/04/2014   Routine general medical examination at a health care facility 12/21/2013   Pulmonary infiltrates 07/13/2013    PCP: Hillard Danker, MD  REFERRING PROVIDER: Cristie Hem, PA-C  REFERRING DIAG: [phone] (ICD-10-CM) - History of total hip replacement, right  THERAPY DIAG:  History of total hip replacement, right - Plan: PT plan of care  cert/re-cert  Muscle weakness (generalized) - Plan: PT plan of care cert/re-cert  Difficulty in walking, not elsewhere classified - Plan: PT plan of care cert/re-cert  Rationale for Evaluation and Treatment: Rehabilitation  ONSET DATE: 09/06/22 DOS R THA   SUBJECTIVE:   SUBJECTIVE STATEMENT: States that he is not in pain since the surgery moving significantly better.  Reports getting in and out of the car is a lot easier compared to preoperatively.  Reports he had home health therapy and that helps a lot.  Reports that the PA removed his bandage and stitches last week.  Reports he wants to get back to normal and is frustrated with limitations at this time.  Follows up with MD in a few weeks.  No complaints just eager to get back to independence.  Reports sometimes he does not use the cane while at home.  Daughter present during today's evaluation.  PERTINENT HISTORY: Lung emphysema, AVN in B hips PAIN:  Are you having pain? Yes: NPRS scale: not much/10 Pain location: right lateral hip Pain description: sore Aggravating factors: pressure Relieving factors: rest  PRECAUTIONS: Anterior hip  RED FLAGS: None   WEIGHT BEARING RESTRICTIONS: Yes WBAT  FALLS:  Has patient fallen in last 6 months? No  LIVING ENVIRONMENT: Lives with: lives with their family Lives in: House/apartment Stairs: No Has following equipment at home: Single point cane  OCCUPATION: retired, active  PLOF: Independent  PATIENT GOALS: to be able to dance  NEXT MD VISIT: in 3 weeks  OBJECTIVE:     PATIENT SURVEYS:  FOTO 47%  COGNITION: Overall cognitive status: Within functional limits for tasks assessed     SENSATION: Not tested  EDEMA:  none    POSTURE: rounded shoulders, forward head, decreased lumbar lordosis, increased thoracic kyphosis, and flexed trunk   PALPATION: Tenderness in right quad/thigh   LE Measurements Lower Extremity Right EVAL Left EVAL   A/PROM MMT A/PROM MMT   Hip Flexion 100 3* WFL 4  Hip Extension      Hip Abduction      Hip Adduction      Hip Internal rotation 30     Hip External rotation 10     Knee Flexion 3+  4-   Knee Extension 3+  4   Ankle Dorsiflexion 4-  4+   Ankle Plantarflexion      Ankle Inversion      Ankle Eversion       (Blank rows = not tested) * uncomfortable    GAIT: Distance walked: 300 feet with cane wide BOS and limited hip extension   TODAY'S TREATMENT:                                                                                                                              DATE:   09/27/2022 Current HEP: Side stepping, 3 way hip , quad iso, heel slides, walking to mailbox  Therapeutic Exercise:  Aerobic: Supine: hip add isometric x10 5" holds, hip abd green band Prone:  Seated:  Standing: hip abd with tactile cues for glute med activation - 5 minutes, glute squeeze x10 5" holds Neuromuscular Re-education: Manual Therapy: Therapeutic Activity: Self Care: Trigger Point Dry Needling:  Modalities:    PATIENT EDUCATION:  Education details: on current presentation, on HEP, on clinical outcomes score and POC. Rationale behind all interventions Person educated: Patient Education method: Explanation, Demonstration, and Handouts Education comprehension: verbalized understanding   HOME EXERCISE PROGRAM: [id number]  ASSESSMENT:  CLINICAL IMPRESSION: Patient is status post right hip replacement on September 06, 2002.  Overall patient is doing very well with significantly less pain postoperatively.  Patient does demonstrate limitations in overall range of motion, strength and function.  Continues to move with wide base of support and hip and internal rotation which was how he was ambulating previously.  Educated patient and daughter in current presentation as well as plan moving forward and patient would greatly benefit from skilled PT to improve overall physical presentation and return him to optimal  function.  OBJECTIVE IMPAIRMENTS: Abnormal gait, decreased activity tolerance, decreased balance, decreased endurance, decreased knowledge of use of DME, decreased mobility, difficulty walking, decreased ROM, decreased strength, increased edema, improper body mechanics, postural dysfunction, and pain.   ACTIVITY LIMITATIONS: carrying, lifting, bending, sitting, standing, squatting, sleeping, stairs, transfers, bed mobility, bathing, dressing, and locomotion level  PARTICIPATION LIMITATIONS: cleaning,  driving, community activity, and yard work  PERSONAL FACTORS: Age and 1 comorbidity: AVN in left hip  are also affecting patient's functional outcome.   REHAB POTENTIAL: Good  CLINICAL DECISION MAKING: Stable/uncomplicated  EVALUATION COMPLEXITY: Low   GOALS: Goals reviewed with patient? yes  SHORT TERM GOALS: Target date: 11/08/2022  Patient will be independent in self management strategies to improve quality of life and functional outcomes. Baseline: New Program Goal status: INITIAL  2.  Patient will report at least 25 % improvement in overall symptoms and/or function to demonstrate improved functional mobility Baseline: 0% better Goal status: INITIAL  3.  Patient will be able to ambulate for at least 25 minutes with limited gait deviations and without cane to improve community ambulation Baseline: Unable Goal status: INITIAL  4. Patient will be able to stand on right leg for at least 30 seconds to demonstrate improved weightbearing tolerance   Baseline: Unable Goal status: INITIAL    LONG TERM GOALS: Target date: 12/20/2022   Patient will report at least 50 % improvement in overall symptoms and/or function to demonstrate improved functional mobility Baseline: 0% better Goal status: INITIAL  2.  Patient will improve score on FOTO outcomes measure to projected score to demonstrate overall improved function and QOL Baseline: see above Goal status: INITIAL  3.  Patient  will be able to stand up tall without hip flexion and right hip to demonstrate improved standing posture Baseline: Unable Goal status: INITIAL       PLAN:  PT FREQUENCY:1- 2x/week for total of 16 visits  PT DURATION: 12 weeks  PLANNED INTERVENTIONS: Therapeutic exercises, Therapeutic activity, Neuromuscular re-education, Balance training, Gait training, Patient/Family education, Self Care, Joint mobilization, Joint manipulation, Stair training, Vestibular training, Canalith repositioning, Orthotic/Fit training, Prosthetic training, DME instructions, Aquatic Therapy, Dry Needling, Electrical stimulation, Spinal manipulation, Spinal mobilization, Cryotherapy, Moist heat, Taping, Traction, Ultrasound, Ionotophoresis 4mg /ml Dexamethasone, Manual therapy, and Re-evaluation.   PLAN FOR NEXT SESSION: hip ROM, and strengthening, walking   2:23 PM, 09/27/22 Tereasa Coop, DPT Physical Therapy with [facility]

## 2022-09-27 ENCOUNTER — Ambulatory Visit: Payer: Medicare PPO | Admitting: Physical Therapy

## 2022-09-27 DIAGNOSIS — Z96641 Presence of right artificial hip joint: Secondary | ICD-10-CM | POA: Diagnosis not present

## 2022-09-27 DIAGNOSIS — M6281 Muscle weakness (generalized): Secondary | ICD-10-CM

## 2022-09-27 DIAGNOSIS — R262 Difficulty in walking, not elsewhere classified: Secondary | ICD-10-CM

## 2022-10-02 ENCOUNTER — Encounter: Payer: Self-pay | Admitting: Physical Therapy

## 2022-10-02 ENCOUNTER — Ambulatory Visit: Payer: Medicare PPO | Admitting: Physical Therapy

## 2022-10-02 DIAGNOSIS — M6281 Muscle weakness (generalized): Secondary | ICD-10-CM | POA: Diagnosis not present

## 2022-10-02 DIAGNOSIS — Z96641 Presence of right artificial hip joint: Secondary | ICD-10-CM | POA: Diagnosis not present

## 2022-10-02 DIAGNOSIS — R262 Difficulty in walking, not elsewhere classified: Secondary | ICD-10-CM

## 2022-10-02 DIAGNOSIS — M79604 Pain in right leg: Secondary | ICD-10-CM

## 2022-10-02 NOTE — Therapy (Signed)
OUTPATIENT PHYSICAL THERAPY LOWER EXTREMITY TREATMENT   Patient Name: Danny ROBBERSON MRN: [id number] DOB:March 16, 1934, 88 y.o., male Today's Date: 10/02/2022  END OF SESSION:  PT End of Session - 10/02/22 1020     Visit Number 2    Number of Visits 16    Date for PT Re-Evaluation 12/20/22    Authorization Type humana - auth 12 visits from 09/27/22 -12/08/22    Authorization - Visit Number 2    Authorization - Number of Visits 12    PT Start Time 1016    PT Stop Time 1055    PT Time Calculation (min) 39 min    Activity Tolerance Patient limited by pain    Behavior During Therapy [facility] for tasks assessed/performed              Past Medical History:  Diagnosis Date   Arthritis    Cataract    Emphysema of lung (HCC)    Hypertension    Pneumonia 2015   Past Surgical History:  Procedure Laterality Date   HERNIA REPAIR  2003   TONSILLECTOMY  1939   TOTAL HIP ARTHROPLASTY Right 09/06/2022   Procedure: TOTAL HIP ARTHROPLASTY ANTERIOR APPROACH;  Surgeon: Tarry Kos, MD;  Location: [facility];  Service: Orthopedics;  Laterality: Right;  3-C   VIDEO BRONCHOSCOPY Bilateral 08/11/2013   Procedure: VIDEO BRONCHOSCOPY WITH FLUORO;  Surgeon: Nyoka Cowden, MD;  Location: [facility];  Service: Cardiopulmonary;  Laterality: Bilateral;   Patient Active Problem List   Diagnosis Date Noted   Primary osteoarthritis of right hip 09/06/2022   Essential hypertension 08/14/2022   Pre-op examination 08/14/2022   Bilateral hip joint arthritis 03/13/2022   Myalgia 11/28/2021   Thoracic aortic aneurysm (HCC) 06/07/2021   Aortic atherosclerosis (HCC) 07/19/2020   Bronchiectasis without complication (HCC) 04/22/2018   Impaired fasting glucose 10/04/2014   Routine general medical examination at a health care facility 12/21/2013   Pulmonary infiltrates 07/13/2013    PCP: Hillard Danker, MD  REFERRING PROVIDER: Cristie Hem, PA-C  REFERRING DIAG: [phone] (ICD-10-CM) - History of  total hip replacement, right  THERAPY DIAG:  History of total hip replacement, right  Difficulty in walking, not elsewhere classified  Pain in right leg  Muscle weakness (generalized)  Rationale for Evaluation and Treatment: Rehabilitation  ONSET DATE: 09/06/22 DOS R THA   SUBJECTIVE:   SUBJECTIVE STATEMENT: 10/02/2022 States he still has soreness if he doesn't take his ibuprofen. States he gets sleepy. Wife states he still shuffles his feet. States he walked outside.   EVAL:States that he is not in pain since the surgery moving significantly better.  Reports getting in and out of the car is a lot easier compared to preoperatively.  Reports he had home health therapy and that helps a lot.  Reports that the PA removed his bandage and stitches last week.  Reports he wants to get back to normal and is frustrated with limitations at this time.  Follows up with MD in a few weeks.  No complaints just eager to get back to independence.  Reports sometimes he does not use the cane while at home.  Daughter present during today's evaluation.  PERTINENT HISTORY: Lung emphysema, AVN in B hips PAIN:  Are you having pain? Yes: NPRS scale: not much/10 Pain location: right lateral hip Pain description: sore Aggravating factors: pressure Relieving factors: rest  PRECAUTIONS: Anterior hip  RED FLAGS: None   WEIGHT BEARING RESTRICTIONS: Yes WBAT  FALLS:  Has patient fallen  in last 6 months? No  LIVING ENVIRONMENT: Lives with: lives with their family Lives in: House/apartment Stairs: No Has following equipment at home: Single point cane  OCCUPATION: retired, active   PLOF: Independent  PATIENT GOALS: to be able to dance  NEXT MD VISIT: in 3 weeks  OBJECTIVE:     PATIENT SURVEYS:  FOTO 47%  COGNITION: Overall cognitive status: Within functional limits for tasks assessed     SENSATION: Not tested  EDEMA:  none    POSTURE: rounded shoulders, forward head, decreased  lumbar lordosis, increased thoracic kyphosis, and flexed trunk   PALPATION: Tenderness in right quad/thigh   LE Measurements Lower Extremity Right EVAL Left EVAL   A/PROM MMT A/PROM MMT  Hip Flexion 100 3* WFL 4  Hip Extension      Hip Abduction      Hip Adduction      Hip Internal rotation 30     Hip External rotation 10     Knee Flexion 3+  4-   Knee Extension 3+  4   Ankle Dorsiflexion 4-  4+   Ankle Plantarflexion      Ankle Inversion      Ankle Eversion       (Blank rows = not tested) * uncomfortable    GAIT: Distance walked: 300 feet with cane wide BOS and limited hip extension   TODAY'S TREATMENT:                                                                                                                              DATE:   10/02/2022 Current HEP: Side stepping, 3 way hip , quad iso, heel slides, walking to mailbox  Therapeutic Exercise:  Aerobic: Supine: hip add isometric  2 minutes, 5" holds, hip abd green band Prone:  Seated: hip add 5" holds 2 minutes, hip abd 5" holds 2 minutes  Standing: shoulder flexion up wall for tall posture - making sure to not extend hip and stay in neutral 8 minutes, TKE 2 minutes right, right weight shifts 2 minutes x2 rounds, walking with cane focusing on upright postures 400 feet total Neuromuscular Re-education: Manual Therapy: Therapeutic Activity: Self Care: Trigger Point Dry Needling:  Modalities:    PATIENT EDUCATION:  Education details: on HEP Person educated: Patient Education method: Explanation, Demonstration, and Handouts Education comprehension: verbalized understanding   HOME EXERCISE PROGRAM: [id number]  ASSESSMENT:  CLINICAL IMPRESSION: 10/02/2022 Session focused on review of exercises and added to HEP. Focused on TKE and walking mechanics on this date. Fatigue and gentle stretch on front of hip with some of exercises, made sure to keep hip in neutral or flexed position during exercises. Overall  tolerated exercises well. Will continue with current POC.    EVAL:Patient is status post right hip replacement on September 06, 2002.  Overall patient is doing very well with significantly less pain postoperatively.  Patient does demonstrate limitations in overall range of motion, strength and  function.  Continues to move with wide base of support and hip and internal rotation which was how he was ambulating previously.  Educated patient and daughter in current presentation as well as plan moving forward and patient would greatly benefit from skilled PT to improve overall physical presentation and return him to optimal function.  OBJECTIVE IMPAIRMENTS: Abnormal gait, decreased activity tolerance, decreased balance, decreased endurance, decreased knowledge of use of DME, decreased mobility, difficulty walking, decreased ROM, decreased strength, increased edema, improper body mechanics, postural dysfunction, and pain.   ACTIVITY LIMITATIONS: carrying, lifting, bending, sitting, standing, squatting, sleeping, stairs, transfers, bed mobility, bathing, dressing, and locomotion level  PARTICIPATION LIMITATIONS: cleaning, driving, community activity, and yard work  PERSONAL FACTORS: Age and 1 comorbidity: AVN in left hip  are also affecting patient's functional outcome.   REHAB POTENTIAL: Good  CLINICAL DECISION MAKING: Stable/uncomplicated  EVALUATION COMPLEXITY: Low   GOALS: Goals reviewed with patient? yes  SHORT TERM GOALS: Target date: 11/08/2022  Patient will be independent in self management strategies to improve quality of life and functional outcomes. Baseline: New Program Goal status: INITIAL  2.  Patient will report at least 25 % improvement in overall symptoms and/or function to demonstrate improved functional mobility Baseline: 0% better Goal status: INITIAL  3.  Patient will be able to ambulate for at least 25 minutes with limited gait deviations and without cane to improve  community ambulation Baseline: Unable Goal status: INITIAL  4. Patient will be able to stand on right leg for at least 30 seconds to demonstrate improved weightbearing tolerance   Baseline: Unable Goal status: INITIAL    LONG TERM GOALS: Target date: 12/20/2022   Patient will report at least 50 % improvement in overall symptoms and/or function to demonstrate improved functional mobility Baseline: 0% better Goal status: INITIAL  2.  Patient will improve score on FOTO outcomes measure to projected score to demonstrate overall improved function and QOL Baseline: see above Goal status: INITIAL  3.  Patient will be able to stand up tall without hip flexion and right hip to demonstrate improved standing posture Baseline: Unable Goal status: INITIAL       PLAN:  PT FREQUENCY:1- 2x/week for total of 16 visits  PT DURATION: 12 weeks  PLANNED INTERVENTIONS: Therapeutic exercises, Therapeutic activity, Neuromuscular re-education, Balance training, Gait training, Patient/Family education, Self Care, Joint mobilization, Joint manipulation, Stair training, Vestibular training, Canalith repositioning, Orthotic/Fit training, Prosthetic training, DME instructions, Aquatic Therapy, Dry Needling, Electrical stimulation, Spinal manipulation, Spinal mobilization, Cryotherapy, Moist heat, Taping, Traction, Ultrasound, Ionotophoresis 4mg /ml Dexamethasone, Manual therapy, and Re-evaluation.   PLAN FOR NEXT SESSION: hip ROM, and strengthening, walking   11:17 AM, 10/02/22 Tereasa Coop, DPT Physical Therapy with [facility]

## 2022-10-04 ENCOUNTER — Encounter: Payer: Self-pay | Admitting: Physical Therapy

## 2022-10-04 ENCOUNTER — Ambulatory Visit: Payer: Medicare PPO | Admitting: Physical Therapy

## 2022-10-04 DIAGNOSIS — M79604 Pain in right leg: Secondary | ICD-10-CM

## 2022-10-04 DIAGNOSIS — Z96641 Presence of right artificial hip joint: Secondary | ICD-10-CM

## 2022-10-04 DIAGNOSIS — R262 Difficulty in walking, not elsewhere classified: Secondary | ICD-10-CM

## 2022-10-04 DIAGNOSIS — M6281 Muscle weakness (generalized): Secondary | ICD-10-CM | POA: Diagnosis not present

## 2022-10-04 NOTE — Therapy (Signed)
OUTPATIENT PHYSICAL THERAPY LOWER EXTREMITY TREATMENT   Patient Name: Danny Gross MRN: [id number] DOB:03-21-1934, 88 y.o., male Today's Date: 10/04/2022  END OF SESSION:  PT End of Session - 10/04/22 1355     Visit Number 3    Number of Visits 16    Date for PT Re-Evaluation 12/20/22    Authorization Type humana - auth 12 visits from 09/27/22 -12/08/22    Authorization - Visit Number 3    Authorization - Number of Visits 12    PT Start Time 1349    PT Stop Time 1430    PT Time Calculation (min) 41 min    Activity Tolerance Patient limited by pain    Behavior During Therapy [facility] for tasks assessed/performed               Past Medical History:  Diagnosis Date   Arthritis    Cataract    Emphysema of lung (HCC)    Hypertension    Pneumonia 2015   Past Surgical History:  Procedure Laterality Date   HERNIA REPAIR  2003   TONSILLECTOMY  1939   TOTAL HIP ARTHROPLASTY Right 09/06/2022   Procedure: TOTAL HIP ARTHROPLASTY ANTERIOR APPROACH;  Surgeon: Tarry Kos, MD;  Location: [facility] OR;  Service: Orthopedics;  Laterality: Right;  3-C   VIDEO BRONCHOSCOPY Bilateral 08/11/2013   Procedure: VIDEO BRONCHOSCOPY WITH FLUORO;  Surgeon: Nyoka Cowden, MD;  Location: [facility];  Service: Cardiopulmonary;  Laterality: Bilateral;   Patient Active Problem List   Diagnosis Date Noted   Primary osteoarthritis of right hip 09/06/2022   Essential hypertension 08/14/2022   Pre-op examination 08/14/2022   Bilateral hip joint arthritis 03/13/2022   Myalgia 11/28/2021   Thoracic aortic aneurysm (HCC) 06/07/2021   Aortic atherosclerosis (HCC) 07/19/2020   Bronchiectasis without complication (HCC) 04/22/2018   Impaired fasting glucose 10/04/2014   Routine general medical examination at a health care facility 12/21/2013   Pulmonary infiltrates 07/13/2013    PCP: Hillard Danker, MD  REFERRING PROVIDER: Cristie Hem, PA-C  REFERRING DIAG: [phone] (ICD-10-CM) - History of  total hip replacement, right  THERAPY DIAG:  History of total hip replacement, right  Difficulty in walking, not elsewhere classified  Pain in right leg  Muscle weakness (generalized)  Rationale for Evaluation and Treatment: Rehabilitation  ONSET DATE: 09/06/22 DOS R THA   SUBJECTIVE:   SUBJECTIVE STATEMENT: 10/04/2022 States he still has soreness if he doesn't take his ibuprofen. States he gets sleepy. Wife states he still shuffles his feet. States he walked outside.   EVAL:States that he is not in pain since the surgery moving significantly better.  Reports getting in and out of the car is a lot easier compared to preoperatively.  Reports he had home health therapy and that helps a lot.  Reports that the PA removed his bandage and stitches last week.  Reports he wants to get back to normal and is frustrated with limitations at this time.  Follows up with MD in a few weeks.  No complaints just eager to get back to independence.  Reports sometimes he does not use the cane while at home.  Daughter present during today's evaluation.  PERTINENT HISTORY: Lung emphysema, AVN in B hips PAIN:  Are you having pain? Yes: NPRS scale: not much/10 Pain location: right lateral hip Pain description: sore Aggravating factors: pressure Relieving factors: rest  PRECAUTIONS: Anterior hip  RED FLAGS: None   WEIGHT BEARING RESTRICTIONS: Yes WBAT  FALLS:  Has patient  fallen in last 6 months? No  LIVING ENVIRONMENT: Lives with: lives with their family Lives in: House/apartment Stairs: No Has following equipment at home: Single point cane  OCCUPATION: retired, active   PLOF: Independent  PATIENT GOALS: to be able to dance  NEXT MD VISIT: in 3 weeks  OBJECTIVE:     PATIENT SURVEYS:  FOTO 47%  COGNITION: Overall cognitive status: Within functional limits for tasks assessed     SENSATION: Not tested  EDEMA:  none    POSTURE: rounded shoulders, forward head, decreased  lumbar lordosis, increased thoracic kyphosis, and flexed trunk   PALPATION: Tenderness in right quad/thigh   LE Measurements Lower Extremity Right EVAL Left EVAL   A/PROM MMT A/PROM MMT  Hip Flexion 100 3* WFL 4  Hip Extension      Hip Abduction      Hip Adduction      Hip Internal rotation 30     Hip External rotation 10     Knee Flexion 3+  4-   Knee Extension 3+  4   Ankle Dorsiflexion 4-  4+   Ankle Plantarflexion      Ankle Inversion      Ankle Eversion       (Blank rows = not tested) * uncomfortable    GAIT: Distance walked: 300 feet with cane wide BOS and limited hip extension   TODAY'S TREATMENT:                                                                                                                              DATE:   10/04/2022 Current HEP: Side stepping, 3 way hip , quad iso, heel slides, walking to mailbox  Therapeutic Exercise:  Aerobic: Supine:bent knee fall outs 3 minutes, PROM of right hip in all directions as tolerated- tolerated well 15 minutes, pelvic tilts 3 minutes, bridges to neutral or less 3x10 - felt good,  hip add isometric  2 minutes, 5" holds, hip abd green band Prone:  Seated:    Standing  Neuromuscular Re-education: Manual Therapy: Therapeutic Activity: Self Care: Trigger Point Dry Needling:  Modalities:    PATIENT EDUCATION:  Education details: on HEP Person educated: Patient Education method: Explanation, Demonstration, and Handouts Education comprehension: verbalized understanding   HOME EXERCISE PROGRAM: [id number]  ASSESSMENT:  CLINICAL IMPRESSION: 10/04/2022 Session focused on gentle hip ROM in all directions, tolerated this well. Supine gentle hip abd felt best. Added pelvic tilts, bent knee fall outs and bridges to HEP. Discussed not going into pain. Patient with desire to return to pushups and superman exercises. Discussed avoiding superman LE at this time secondary to anterior hip incision. Patient  verbalized understanding. Will continue with current POC as tolerated.    EVAL:Patient is status post right hip replacement on September 06, 2002.  Overall patient is doing very well with significantly less pain postoperatively.  Patient does demonstrate limitations in overall range of motion, strength and function.  Continues  to move with wide base of support and hip and internal rotation which was how he was ambulating previously.  Educated patient and daughter in current presentation as well as plan moving forward and patient would greatly benefit from skilled PT to improve overall physical presentation and return him to optimal function.  OBJECTIVE IMPAIRMENTS: Abnormal gait, decreased activity tolerance, decreased balance, decreased endurance, decreased knowledge of use of DME, decreased mobility, difficulty walking, decreased ROM, decreased strength, increased edema, improper body mechanics, postural dysfunction, and pain.   ACTIVITY LIMITATIONS: carrying, lifting, bending, sitting, standing, squatting, sleeping, stairs, transfers, bed mobility, bathing, dressing, and locomotion level  PARTICIPATION LIMITATIONS: cleaning, driving, community activity, and yard work  PERSONAL FACTORS: Age and 1 comorbidity: AVN in left hip  are also affecting patient's functional outcome.   REHAB POTENTIAL: Good  CLINICAL DECISION MAKING: Stable/uncomplicated  EVALUATION COMPLEXITY: Low   GOALS: Goals reviewed with patient? yes  SHORT TERM GOALS: Target date: 11/08/2022  Patient will be independent in self management strategies to improve quality of life and functional outcomes. Baseline: New Program Goal status: INITIAL  2.  Patient will report at least 25 % improvement in overall symptoms and/or function to demonstrate improved functional mobility Baseline: 0% better Goal status: INITIAL  3.  Patient will be able to ambulate for at least 25 minutes with limited gait deviations and without cane to  improve community ambulation Baseline: Unable Goal status: INITIAL  4. Patient will be able to stand on right leg for at least 30 seconds to demonstrate improved weightbearing tolerance   Baseline: Unable Goal status: INITIAL    LONG TERM GOALS: Target date: 12/20/2022   Patient will report at least 50 % improvement in overall symptoms and/or function to demonstrate improved functional mobility Baseline: 0% better Goal status: INITIAL  2.  Patient will improve score on FOTO outcomes measure to projected score to demonstrate overall improved function and QOL Baseline: see above Goal status: INITIAL  3.  Patient will be able to stand up tall without hip flexion and right hip to demonstrate improved standing posture Baseline: Unable Goal status: INITIAL       PLAN:  PT FREQUENCY:1- 2x/week for total of 16 visits  PT DURATION: 12 weeks  PLANNED INTERVENTIONS: Therapeutic exercises, Therapeutic activity, Neuromuscular re-education, Balance training, Gait training, Patient/Family education, Self Care, Joint mobilization, Joint manipulation, Stair training, Vestibular training, Canalith repositioning, Orthotic/Fit training, Prosthetic training, DME instructions, Aquatic Therapy, Dry Needling, Electrical stimulation, Spinal manipulation, Spinal mobilization, Cryotherapy, Moist heat, Taping, Traction, Ultrasound, Ionotophoresis 4mg /ml Dexamethasone, Manual therapy, and Re-evaluation.   PLAN FOR NEXT SESSION: hip ROM, and strengthening, walking   2:41 PM, 10/04/22 Tereasa Coop, DPT Physical Therapy with [facility]

## 2022-10-09 ENCOUNTER — Encounter: Payer: Self-pay | Admitting: Physical Therapy

## 2022-10-09 ENCOUNTER — Ambulatory Visit: Payer: Medicare PPO | Admitting: Physical Therapy

## 2022-10-09 DIAGNOSIS — M79604 Pain in right leg: Secondary | ICD-10-CM | POA: Diagnosis not present

## 2022-10-09 DIAGNOSIS — Z96641 Presence of right artificial hip joint: Secondary | ICD-10-CM

## 2022-10-09 DIAGNOSIS — M6281 Muscle weakness (generalized): Secondary | ICD-10-CM

## 2022-10-09 DIAGNOSIS — R262 Difficulty in walking, not elsewhere classified: Secondary | ICD-10-CM

## 2022-10-09 NOTE — Therapy (Signed)
OUTPATIENT PHYSICAL THERAPY LOWER EXTREMITY TREATMENT   Patient Name: BLAYZE HAEN MRN: [id number] DOB:Aug 29, 1934, 88 y.o., male Today's Date: 10/09/2022  END OF SESSION:  PT End of Session - 10/09/22 1345     Visit Number 4    Number of Visits 16    Date for PT Re-Evaluation 12/20/22    Authorization Type humana - auth 12 visits from 09/27/22 -12/08/22    Authorization - Visit Number 4    Authorization - Number of Visits 12    PT Start Time 1348    PT Stop Time 1427    PT Time Calculation (min) 39 min    Activity Tolerance Patient limited by pain    Behavior During Therapy [facility] for tasks assessed/performed               Past Medical History:  Diagnosis Date   Arthritis    Cataract    Emphysema of lung (HCC)    Hypertension    Pneumonia 2015   Past Surgical History:  Procedure Laterality Date   HERNIA REPAIR  2003   TONSILLECTOMY  1939   TOTAL HIP ARTHROPLASTY Right 09/06/2022   Procedure: TOTAL HIP ARTHROPLASTY ANTERIOR APPROACH;  Surgeon: Tarry Kos, MD;  Location: [facility] OR;  Service: Orthopedics;  Laterality: Right;  3-C   VIDEO BRONCHOSCOPY Bilateral 08/11/2013   Procedure: VIDEO BRONCHOSCOPY WITH FLUORO;  Surgeon: Nyoka Cowden, MD;  Location: [facility];  Service: Cardiopulmonary;  Laterality: Bilateral;   Patient Active Problem List   Diagnosis Date Noted   Primary osteoarthritis of right hip 09/06/2022   Essential hypertension 08/14/2022   Pre-op examination 08/14/2022   Bilateral hip joint arthritis 03/13/2022   Myalgia 11/28/2021   Thoracic aortic aneurysm (HCC) 06/07/2021   Aortic atherosclerosis (HCC) 07/19/2020   Bronchiectasis without complication (HCC) 04/22/2018   Impaired fasting glucose 10/04/2014   Routine general medical examination at a health care facility 12/21/2013   Pulmonary infiltrates 07/13/2013    PCP: Hillard Danker, MD  REFERRING PROVIDER: Cristie Hem, PA-C  REFERRING DIAG: [phone] (ICD-10-CM) - History of  total hip replacement, right  THERAPY DIAG:  History of total hip replacement, right  Difficulty in walking, not elsewhere classified  Pain in right leg  Muscle weakness (generalized)  Rationale for Evaluation and Treatment: Rehabilitation  ONSET DATE: 09/06/22 DOS R THA   SUBJECTIVE:   SUBJECTIVE STATEMENT: 10/09/2022 States he wants to go dancing on Friday and they have a dance lesson on thursday. States he stopped using the cane over the last few days.   EVAL:States that he is not in pain since the surgery moving significantly better.  Reports getting in and out of the car is a lot easier compared to preoperatively.  Reports he had home health therapy and that helps a lot.  Reports that the PA removed his bandage and stitches last week.  Reports he wants to get back to normal and is frustrated with limitations at this time.  Follows up with MD in a few weeks.  No complaints just eager to get back to independence.  Reports sometimes he does not use the cane while at home.  Daughter present during today's evaluation.  PERTINENT HISTORY: Lung emphysema, AVN in B hips PAIN:  Are you having pain? Yes: NPRS scale: not much/10 Pain location: right lateral hip Pain description: sore Aggravating factors: pressure Relieving factors: rest  PRECAUTIONS: Anterior hip  RED FLAGS: None   WEIGHT BEARING RESTRICTIONS: Yes WBAT  FALLS:  Has  patient fallen in last 6 months? No  LIVING ENVIRONMENT: Lives with: lives with their family Lives in: House/apartment Stairs: No Has following equipment at home: Single point cane  OCCUPATION: retired, active   PLOF: Independent  PATIENT GOALS: to be able to dance  NEXT MD VISIT: in 3 weeks  OBJECTIVE:     PATIENT SURVEYS:  FOTO 47%  COGNITION: Overall cognitive status: Within functional limits for tasks assessed     SENSATION: Not tested  EDEMA:  none    POSTURE: rounded shoulders, forward head, decreased lumbar  lordosis, increased thoracic kyphosis, and flexed trunk   PALPATION: Tenderness in right quad/thigh   LE Measurements Lower Extremity Right EVAL Left EVAL   A/PROM MMT A/PROM MMT  Hip Flexion 100 3* WFL 4  Hip Extension      Hip Abduction      Hip Adduction      Hip Internal rotation 30     Hip External rotation 10     Knee Flexion 3+  4-   Knee Extension 3+  4   Ankle Dorsiflexion 4-  4+   Ankle Plantarflexion      Ankle Inversion      Ankle Eversion       (Blank rows = not tested) * uncomfortable    GAIT: Distance walked: 300 feet with cane wide BOS and limited hip extension   TODAY'S TREATMENT:                                                                                                                              DATE:   10/09/2022  Therapeutic Exercise:  Aerobic: Prone:  Seated:    Standing: on balls of feet at counter side step 3 minutes, side step offs B lateral and forwd/lateral to mimic dancing - no UE support 10 minutes, shoulder flexion up wall x20 10" holds B, DF with back up against the wall 3x10 5" holds B, step ups on 6" step and 2 hand support 2x15 R  Neuromuscular Re-education: Manual Therapy: Therapeutic Activity: Self Care: Trigger Point Dry Needling:  Modalities:    PATIENT EDUCATION:  Education details: on HEP Person educated: Patient Education method: Explanation, Demonstration, and Handouts Education comprehension: verbalized understanding   HOME EXERCISE PROGRAM: [id number]  ASSESSMENT:  CLINICAL IMPRESSION: 10/09/2022 Session focused on answering all questions about desire to return to dancing with wife on Friday.  Discussed lower step dances in favor of quick step dances.  Discussed making sure no pole is felt in the hip.  Discussed and educated wife on being a sturdy base in case patient has heavier foot placement.  Practice sidesteps with and without upper extremity support and focused on picking up legs as this continues to  be slight limitation for patient.  Overall patient tolerated very well reporting no pain while pulling along anterior hip.  Improved ability to walk with taller posture noted during today's session.  Will continue with current  plan of care as tolerated.  EVAL:Patient is status post right hip replacement on September 06, 2002.  Overall patient is doing very well with significantly less pain postoperatively.  Patient does demonstrate limitations in overall range of motion, strength and function.  Continues to move with wide base of support and hip and internal rotation which was how he was ambulating previously.  Educated patient and daughter in current presentation as well as plan moving forward and patient would greatly benefit from skilled PT to improve overall physical presentation and return him to optimal function.  OBJECTIVE IMPAIRMENTS: Abnormal gait, decreased activity tolerance, decreased balance, decreased endurance, decreased knowledge of use of DME, decreased mobility, difficulty walking, decreased ROM, decreased strength, increased edema, improper body mechanics, postural dysfunction, and pain.   ACTIVITY LIMITATIONS: carrying, lifting, bending, sitting, standing, squatting, sleeping, stairs, transfers, bed mobility, bathing, dressing, and locomotion level  PARTICIPATION LIMITATIONS: cleaning, driving, community activity, and yard work  PERSONAL FACTORS: Age and 1 comorbidity: AVN in left hip  are also affecting patient's functional outcome.   REHAB POTENTIAL: Good  CLINICAL DECISION MAKING: Stable/uncomplicated  EVALUATION COMPLEXITY: Low   GOALS: Goals reviewed with patient? yes  SHORT TERM GOALS: Target date: 11/08/2022  Patient will be independent in self management strategies to improve quality of life and functional outcomes. Baseline: New Program Goal status: INITIAL  2.  Patient will report at least 25 % improvement in overall symptoms and/or function to demonstrate  improved functional mobility Baseline: 0% better Goal status: INITIAL  3.  Patient will be able to ambulate for at least 25 minutes with limited gait deviations and without cane to improve community ambulation Baseline: Unable Goal status: INITIAL  4. Patient will be able to stand on right leg for at least 30 seconds to demonstrate improved weightbearing tolerance   Baseline: Unable Goal status: INITIAL    LONG TERM GOALS: Target date: 12/20/2022   Patient will report at least 50 % improvement in overall symptoms and/or function to demonstrate improved functional mobility Baseline: 0% better Goal status: INITIAL  2.  Patient will improve score on FOTO outcomes measure to projected score to demonstrate overall improved function and QOL Baseline: see above Goal status: INITIAL  3.  Patient will be able to stand up tall without hip flexion and right hip to demonstrate improved standing posture Baseline: Unable Goal status: INITIAL       PLAN:  PT FREQUENCY:1- 2x/week for total of 16 visits  PT DURATION: 12 weeks  PLANNED INTERVENTIONS: Therapeutic exercises, Therapeutic activity, Neuromuscular re-education, Balance training, Gait training, Patient/Family education, Self Care, Joint mobilization, Joint manipulation, Stair training, Vestibular training, Canalith repositioning, Orthotic/Fit training, Prosthetic training, DME instructions, Aquatic Therapy, Dry Needling, Electrical stimulation, Spinal manipulation, Spinal mobilization, Cryotherapy, Moist heat, Taping, Traction, Ultrasound, Ionotophoresis 4mg /ml Dexamethasone, Manual therapy, and Re-evaluation.   PLAN FOR NEXT SESSION: hip ROM, and strengthening, walking   2:46 PM, 10/09/22 Tereasa Coop, DPT Physical Therapy with [facility]

## 2022-10-11 ENCOUNTER — Encounter: Payer: Self-pay | Admitting: Physical Therapy

## 2022-10-11 ENCOUNTER — Ambulatory Visit: Payer: Medicare PPO | Admitting: Physical Therapy

## 2022-10-11 DIAGNOSIS — M6281 Muscle weakness (generalized): Secondary | ICD-10-CM

## 2022-10-11 DIAGNOSIS — M79604 Pain in right leg: Secondary | ICD-10-CM

## 2022-10-11 DIAGNOSIS — R262 Difficulty in walking, not elsewhere classified: Secondary | ICD-10-CM | POA: Diagnosis not present

## 2022-10-11 DIAGNOSIS — Z96641 Presence of right artificial hip joint: Secondary | ICD-10-CM

## 2022-10-11 NOTE — Therapy (Signed)
OUTPATIENT PHYSICAL THERAPY LOWER EXTREMITY TREATMENT   Patient Name: Danny Gross MRN: [id number] DOB:05/09/1934, 88 y.o., male Today's Date: 10/11/2022  END OF SESSION:  PT End of Session - 10/11/22 1344     Visit Number 5    Number of Visits 16    Date for PT Re-Evaluation 12/20/22    Authorization Type humana - auth 12 visits from 09/27/22 -12/08/22    Authorization - Visit Number 5    Authorization - Number of Visits 12    PT Start Time 1348    PT Stop Time 1430    PT Time Calculation (min) 42 min    Activity Tolerance Patient limited by pain    Behavior During Therapy [facility] for tasks assessed/performed               Past Medical History:  Diagnosis Date   Arthritis    Cataract    Emphysema of lung (HCC)    Hypertension    Pneumonia 2015   Past Surgical History:  Procedure Laterality Date   HERNIA REPAIR  2003   TONSILLECTOMY  1939   TOTAL HIP ARTHROPLASTY Right 09/06/2022   Procedure: TOTAL HIP ARTHROPLASTY ANTERIOR APPROACH;  Surgeon: Tarry Kos, MD;  Location: [facility] OR;  Service: Orthopedics;  Laterality: Right;  3-C   VIDEO BRONCHOSCOPY Bilateral 08/11/2013   Procedure: VIDEO BRONCHOSCOPY WITH FLUORO;  Surgeon: Nyoka Cowden, MD;  Location: [facility];  Service: Cardiopulmonary;  Laterality: Bilateral;   Patient Active Problem List   Diagnosis Date Noted   Primary osteoarthritis of right hip 09/06/2022   Essential hypertension 08/14/2022   Pre-op examination 08/14/2022   Bilateral hip joint arthritis 03/13/2022   Myalgia 11/28/2021   Thoracic aortic aneurysm (HCC) 06/07/2021   Aortic atherosclerosis (HCC) 07/19/2020   Bronchiectasis without complication (HCC) 04/22/2018   Impaired fasting glucose 10/04/2014   Routine general medical examination at a health care facility 12/21/2013   Pulmonary infiltrates 07/13/2013    PCP: Hillard Danker, MD  REFERRING PROVIDER: Cristie Hem, PA-C  REFERRING DIAG: [phone] (ICD-10-CM) - History of  total hip replacement, right  THERAPY DIAG:  History of total hip replacement, right  Difficulty in walking, not elsewhere classified  Pain in right leg  Muscle weakness (generalized)  Rationale for Evaluation and Treatment: Rehabilitation  ONSET DATE: 09/06/22 DOS R THA   SUBJECTIVE:   SUBJECTIVE STATEMENT: 10/11/2022 States he is doing well .  Excited to dance tonight and tomorrow.  EVAL:States that he is not in pain since the surgery moving significantly better.  Reports getting in and out of the car is a lot easier compared to preoperatively.  Reports he had home health therapy and that helps a lot.  Reports that the PA removed his bandage and stitches last week.  Reports he wants to get back to normal and is frustrated with limitations at this time.  Follows up with MD in a few weeks.  No complaints just eager to get back to independence.  Reports sometimes he does not use the cane while at home.  Daughter present during today's evaluation.  PERTINENT HISTORY: Lung emphysema, AVN in B hips PAIN:  Are you having pain? Yes: NPRS scale: not much/10 Pain location: right lateral hip Pain description: sore Aggravating factors: pressure Relieving factors: rest  PRECAUTIONS: Anterior hip  RED FLAGS: None   WEIGHT BEARING RESTRICTIONS: Yes WBAT  FALLS:  Has patient fallen in last 6 months? No  LIVING ENVIRONMENT: Lives with: lives with their  family Lives in: House/apartment Stairs: No Has following equipment at home: Single point cane  OCCUPATION: retired, active   PLOF: Independent  PATIENT GOALS: to be able to dance  NEXT MD VISIT: in 3 weeks  OBJECTIVE:     PATIENT SURVEYS:  FOTO 47%  COGNITION: Overall cognitive status: Within functional limits for tasks assessed     SENSATION: Not tested  EDEMA:  none    POSTURE: rounded shoulders, forward head, decreased lumbar lordosis, increased thoracic kyphosis, and flexed trunk   PALPATION: Tenderness  in right quad/thigh   LE Measurements Lower Extremity Right EVAL Left EVAL   A/PROM MMT A/PROM MMT  Hip Flexion 100 3* WFL 4  Hip Extension      Hip Abduction      Hip Adduction      Hip Internal rotation 30     Hip External rotation 10     Knee Flexion 3+  4-   Knee Extension 3+  4   Ankle Dorsiflexion 4-  4+   Ankle Plantarflexion      Ankle Inversion      Ankle Eversion       (Blank rows = not tested) * uncomfortable    GAIT: Distance walked: 300 feet with cane wide BOS and limited hip extension   TODAY'S TREATMENT:                                                                                                                              DATE:   10/11/2022  Therapeutic Exercise:  Supine: FABER stretch on left hip x 4-30-second holds, hip external rotation with legs extended x 10-10-second holds bilaterally, bent knee fallout 3 x 10 5-second holds bilaterally Prone:  Seated: Hip external rotation with heel up 4 x 5 5-second holds bilaterally  Standing: Hip external rotation with step out to the side verbal and tactile cues to activate external rotators and hip-focused on left side but performed bilaterally 10 minutes.  Walking with focus on limiting excessive hip internal rotation on left Neuromuscular Re-education: Manual Therapy: Therapeutic Activity: Self Care: Trigger Point Dry Needling:  Modalities:    PATIENT EDUCATION:  Education details: on HEP Person educated: Patient Education method: Programmer, multimedia, Facilities manager, and Handouts Education comprehension: verbalized understanding   HOME EXERCISE PROGRAM: [id number]  ASSESSMENT:  CLINICAL IMPRESSION: 10/11/2022 Session focused on hip external rotation bilaterally but with a focus on left side as this is worse than his surgical leg.  Limited hip external rotation noted actively and passively but this improved in closed kinetic chain while standing and taking a step with his opposite leg and letting his  hip rotate out.  Required tactile cues along the external rotators to activate these muscles properly.  No pain or pulling noted in right hip during today's session or afterwards.  Added new exercises to home program and will continue with current plan of care as tolerated.  EVAL:Patient is status post right hip replacement  on September 06, 2002.  Overall patient is doing very well with significantly less pain postoperatively.  Patient does demonstrate limitations in overall range of motion, strength and function.  Continues to move with wide base of support and hip and internal rotation which was how he was ambulating previously.  Educated patient and daughter in current presentation as well as plan moving forward and patient would greatly benefit from skilled PT to improve overall physical presentation and return him to optimal function.  OBJECTIVE IMPAIRMENTS: Abnormal gait, decreased activity tolerance, decreased balance, decreased endurance, decreased knowledge of use of DME, decreased mobility, difficulty walking, decreased ROM, decreased strength, increased edema, improper body mechanics, postural dysfunction, and pain.   ACTIVITY LIMITATIONS: carrying, lifting, bending, sitting, standing, squatting, sleeping, stairs, transfers, bed mobility, bathing, dressing, and locomotion level  PARTICIPATION LIMITATIONS: cleaning, driving, community activity, and yard work  PERSONAL FACTORS: Age and 1 comorbidity: AVN in left hip  are also affecting patient's functional outcome.   REHAB POTENTIAL: Good  CLINICAL DECISION MAKING: Stable/uncomplicated  EVALUATION COMPLEXITY: Low   GOALS: Goals reviewed with patient? yes  SHORT TERM GOALS: Target date: 11/08/2022  Patient will be independent in self management strategies to improve quality of life and functional outcomes. Baseline: New Program Goal status: INITIAL  2.  Patient will report at least 25 % improvement in overall symptoms and/or function  to demonstrate improved functional mobility Baseline: 0% better Goal status: INITIAL  3.  Patient will be able to ambulate for at least 25 minutes with limited gait deviations and without cane to improve community ambulation Baseline: Unable Goal status: INITIAL  4. Patient will be able to stand on right leg for at least 30 seconds to demonstrate improved weightbearing tolerance   Baseline: Unable Goal status: INITIAL    LONG TERM GOALS: Target date: 12/20/2022   Patient will report at least 50 % improvement in overall symptoms and/or function to demonstrate improved functional mobility Baseline: 0% better Goal status: INITIAL  2.  Patient will improve score on FOTO outcomes measure to projected score to demonstrate overall improved function and QOL Baseline: see above Goal status: INITIAL  3.  Patient will be able to stand up tall without hip flexion and right hip to demonstrate improved standing posture Baseline: Unable Goal status: INITIAL       PLAN:  PT FREQUENCY:1- 2x/week for total of 16 visits  PT DURATION: 12 weeks  PLANNED INTERVENTIONS: Therapeutic exercises, Therapeutic activity, Neuromuscular re-education, Balance training, Gait training, Patient/Family education, Self Care, Joint mobilization, Joint manipulation, Stair training, Vestibular training, Canalith repositioning, Orthotic/Fit training, Prosthetic training, DME instructions, Aquatic Therapy, Dry Needling, Electrical stimulation, Spinal manipulation, Spinal mobilization, Cryotherapy, Moist heat, Taping, Traction, Ultrasound, Ionotophoresis 4mg /ml Dexamethasone, Manual therapy, and Re-evaluation.   PLAN FOR NEXT SESSION: hip ROM, and strengthening, walking   3:21 PM, 10/11/22 Tereasa Coop, DPT Physical Therapy with [facility]

## 2022-10-14 ENCOUNTER — Other Ambulatory Visit: Payer: Self-pay | Admitting: Physician Assistant

## 2022-10-15 DIAGNOSIS — H401233 Low-tension glaucoma, bilateral, severe stage: Secondary | ICD-10-CM | POA: Diagnosis not present

## 2022-10-15 DIAGNOSIS — Z961 Presence of intraocular lens: Secondary | ICD-10-CM | POA: Diagnosis not present

## 2022-10-15 DIAGNOSIS — H43812 Vitreous degeneration, left eye: Secondary | ICD-10-CM | POA: Diagnosis not present

## 2022-10-16 ENCOUNTER — Ambulatory Visit (INDEPENDENT_AMBULATORY_CARE_PROVIDER_SITE_OTHER): Payer: Medicare PPO | Admitting: Physical Therapy

## 2022-10-16 ENCOUNTER — Encounter: Payer: Self-pay | Admitting: Physical Therapy

## 2022-10-16 DIAGNOSIS — M6281 Muscle weakness (generalized): Secondary | ICD-10-CM

## 2022-10-16 DIAGNOSIS — R262 Difficulty in walking, not elsewhere classified: Secondary | ICD-10-CM

## 2022-10-16 DIAGNOSIS — Z96641 Presence of right artificial hip joint: Secondary | ICD-10-CM

## 2022-10-16 DIAGNOSIS — M79604 Pain in right leg: Secondary | ICD-10-CM

## 2022-10-16 NOTE — Therapy (Signed)
OUTPATIENT PHYSICAL THERAPY LOWER EXTREMITY TREATMENT   Patient Name: Danny Gross MRN: [id number] DOB:July 31, 1934, 88 y.o., male Today's Date: 10/16/2022  END OF SESSION:  PT End of Session - 10/16/22 1345     Visit Number 6    Number of Visits 16    Date for PT Re-Evaluation 12/20/22    Authorization Type humana - auth 12 visits from 09/27/22 -12/08/22    Authorization - Visit Number 6    Authorization - Number of Visits 12    PT Start Time 1346    PT Stop Time 1425    PT Time Calculation (min) 39 min    Activity Tolerance Patient limited by pain    Behavior During Therapy [facility] for tasks assessed/performed               Past Medical History:  Diagnosis Date   Arthritis    Cataract    Emphysema of lung (HCC)    Hypertension    Pneumonia 2015   Past Surgical History:  Procedure Laterality Date   HERNIA REPAIR  2003   TONSILLECTOMY  1939   TOTAL HIP ARTHROPLASTY Right 09/06/2022   Procedure: TOTAL HIP ARTHROPLASTY ANTERIOR APPROACH;  Surgeon: Tarry Kos, MD;  Location: [facility] OR;  Service: Orthopedics;  Laterality: Right;  3-C   VIDEO BRONCHOSCOPY Bilateral 08/11/2013   Procedure: VIDEO BRONCHOSCOPY WITH FLUORO;  Surgeon: Nyoka Cowden, MD;  Location: [facility];  Service: Cardiopulmonary;  Laterality: Bilateral;   Patient Active Problem List   Diagnosis Date Noted   Primary osteoarthritis of right hip 09/06/2022   Essential hypertension 08/14/2022   Pre-op examination 08/14/2022   Bilateral hip joint arthritis 03/13/2022   Myalgia 11/28/2021   Thoracic aortic aneurysm (HCC) 06/07/2021   Aortic atherosclerosis (HCC) 07/19/2020   Bronchiectasis without complication (HCC) 04/22/2018   Impaired fasting glucose 10/04/2014   Routine general medical examination at a health care facility 12/21/2013   Pulmonary infiltrates 07/13/2013    PCP: Hillard Danker, MD  REFERRING PROVIDER: Cristie Hem, PA-C  REFERRING DIAG: [phone] (ICD-10-CM) - History of  total hip replacement, right  THERAPY DIAG:  History of total hip replacement, right  Difficulty in walking, not elsewhere classified  Pain in right leg  Muscle weakness (generalized)  Rationale for Evaluation and Treatment: Rehabilitation  ONSET DATE: 09/06/22 DOS R THA   SUBJECTIVE:   SUBJECTIVE STATEMENT: 10/16/2022 States that he sometimes he think about his walking but is reminded and is better.   EVAL:States that he is not in pain since the surgery moving significantly better.  Reports getting in and out of the car is a lot easier compared to preoperatively.  Reports he had home health therapy and that helps a lot.  Reports that the PA removed his bandage and stitches last week.  Reports he wants to get back to normal and is frustrated with limitations at this time.  Follows up with MD in a few weeks.  No complaints just eager to get back to independence.  Reports sometimes he does not use the cane while at home.  Daughter present during today's evaluation.  PERTINENT HISTORY: Lung emphysema, AVN in B hips PAIN:  Are you having pain? Yes: NPRS scale: 0/10 Pain location: right lateral hip Pain description: sore Aggravating factors: pressure Relieving factors: rest  PRECAUTIONS: Anterior hip  RED FLAGS: None   WEIGHT BEARING RESTRICTIONS: Yes WBAT  FALLS:  Has patient fallen in last 6 months? No  LIVING ENVIRONMENT: Lives with: lives  with their family Lives in: House/apartment Stairs: No Has following equipment at home: Single point cane  OCCUPATION: retired, active   PLOF: Independent  PATIENT GOALS: to be able to dance  NEXT MD VISIT: in 3 weeks  OBJECTIVE:     PATIENT SURVEYS:  FOTO 47%  COGNITION: Overall cognitive status: Within functional limits for tasks assessed     SENSATION: Not tested  EDEMA:  none    POSTURE: rounded shoulders, forward head, decreased lumbar lordosis, increased thoracic kyphosis, and flexed trunk    PALPATION: Tenderness in right quad/thigh   LE Measurements Lower Extremity Right EVAL Left EVAL   A/PROM MMT A/PROM MMT  Hip Flexion 100 3* WFL 4  Hip Extension      Hip Abduction      Hip Adduction      Hip Internal rotation 30     Hip External rotation 10     Knee Flexion 3+  4-   Knee Extension 3+  4   Ankle Dorsiflexion 4-  4+   Ankle Plantarflexion      Ankle Inversion      Ankle Eversion       (Blank rows = not tested) * uncomfortable    GAIT: Distance walked: 300 feet with cane wide BOS and limited hip extension   TODAY'S TREATMENT:                                                                                                                              DATE:   10/16/2022  Therapeutic Exercise:  Reviewed entire HEP - answered all questions  Prone:  Seated:    Standing: walking with purposeful gait- heel toe walking, reduced lateral sway, 15 minutes total walking, side stepping red band 3x12 B, SLS one hand support 3x10 5" holds B, DF   Neuromuscular Re-education: Manual Therapy: Therapeutic Activity: Self Care: Trigger Point Dry Needling:  Modalities:    PATIENT EDUCATION:  Education details: on HEP Person educated: Patient Education method: Explanation, Demonstration, and Handouts Education comprehension: verbalized understanding   HOME EXERCISE PROGRAM: [id number]  ASSESSMENT:  CLINICAL IMPRESSION: 10/16/2022 Focused on review of HEP. Answered all questions. Progressed exercises and tolerated well. Improved walking mechanics noted after standing exercises with improved heel toe gait and reduced lateral sway. Overall patient doing well and would continue to benefit form skilled PT at this time.   EVAL:Patient is status post right hip replacement on September 06, 2002.  Overall patient is doing very well with significantly less pain postoperatively.  Patient does demonstrate limitations in overall range of motion, strength and function.   Continues to move with wide base of support and hip and internal rotation which was how he was ambulating previously.  Educated patient and daughter in current presentation as well as plan moving forward and patient would greatly benefit from skilled PT to improve overall physical presentation and return him to optimal function.  OBJECTIVE IMPAIRMENTS: Abnormal gait, decreased  activity tolerance, decreased balance, decreased endurance, decreased knowledge of use of DME, decreased mobility, difficulty walking, decreased ROM, decreased strength, increased edema, improper body mechanics, postural dysfunction, and pain.   ACTIVITY LIMITATIONS: carrying, lifting, bending, sitting, standing, squatting, sleeping, stairs, transfers, bed mobility, bathing, dressing, and locomotion level  PARTICIPATION LIMITATIONS: cleaning, driving, community activity, and yard work  PERSONAL FACTORS: Age and 1 comorbidity: AVN in left hip  are also affecting patient's functional outcome.   REHAB POTENTIAL: Good  CLINICAL DECISION MAKING: Stable/uncomplicated  EVALUATION COMPLEXITY: Low   GOALS: Goals reviewed with patient? yes  SHORT TERM GOALS: Target date: 11/08/2022  Patient will be independent in self management strategies to improve quality of life and functional outcomes. Baseline: New Program Goal status: INITIAL  2.  Patient will report at least 25 % improvement in overall symptoms and/or function to demonstrate improved functional mobility Baseline: 0% better Goal status: INITIAL  3.  Patient will be able to ambulate for at least 25 minutes with limited gait deviations and without cane to improve community ambulation Baseline: Unable Goal status: INITIAL  4. Patient will be able to stand on right leg for at least 30 seconds to demonstrate improved weightbearing tolerance   Baseline: Unable Goal status: INITIAL    LONG TERM GOALS: Target date: 12/20/2022   Patient will report at least 50 %  improvement in overall symptoms and/or function to demonstrate improved functional mobility Baseline: 0% better Goal status: INITIAL  2.  Patient will improve score on FOTO outcomes measure to projected score to demonstrate overall improved function and QOL Baseline: see above Goal status: INITIAL  3.  Patient will be able to stand up tall without hip flexion and right hip to demonstrate improved standing posture Baseline: Unable Goal status: INITIAL       PLAN:  PT FREQUENCY:1- 2x/week for total of 16 visits  PT DURATION: 12 weeks  PLANNED INTERVENTIONS: Therapeutic exercises, Therapeutic activity, Neuromuscular re-education, Balance training, Gait training, Patient/Family education, Self Care, Joint mobilization, Joint manipulation, Stair training, Vestibular training, Canalith repositioning, Orthotic/Fit training, Prosthetic training, DME instructions, Aquatic Therapy, Dry Needling, Electrical stimulation, Spinal manipulation, Spinal mobilization, Cryotherapy, Moist heat, Taping, Traction, Ultrasound, Ionotophoresis 4mg /ml Dexamethasone, Manual therapy, and Re-evaluation.   PLAN FOR NEXT SESSION: hip ROM, and strengthening, walking   2:50 PM, 10/16/22 Tereasa Coop, DPT Physical Therapy with [facility]

## 2022-10-18 ENCOUNTER — Ambulatory Visit: Payer: Medicare PPO | Admitting: Physical Therapy

## 2022-10-18 ENCOUNTER — Encounter: Payer: Self-pay | Admitting: Physical Therapy

## 2022-10-18 DIAGNOSIS — M79604 Pain in right leg: Secondary | ICD-10-CM | POA: Diagnosis not present

## 2022-10-18 DIAGNOSIS — R262 Difficulty in walking, not elsewhere classified: Secondary | ICD-10-CM | POA: Diagnosis not present

## 2022-10-18 DIAGNOSIS — M6281 Muscle weakness (generalized): Secondary | ICD-10-CM | POA: Diagnosis not present

## 2022-10-18 DIAGNOSIS — Z96641 Presence of right artificial hip joint: Secondary | ICD-10-CM

## 2022-10-18 NOTE — Therapy (Signed)
OUTPATIENT PHYSICAL THERAPY LOWER EXTREMITY TREATMENT   Patient Name: LYLE NIBLETT MRN: [id number] DOB:09/07/1934, 88 y.o., male Today's Date: 10/18/2022  END OF SESSION:  PT End of Session - 10/18/22 1345     Visit Number 7    Number of Visits 16    Date for PT Re-Evaluation 12/20/22    Authorization Type humana - auth 12 visits from 09/27/22 -12/08/22    Authorization - Visit Number 7    Authorization - Number of Visits 12    PT Start Time 1345    PT Stop Time 1425    PT Time Calculation (min) 40 min    Activity Tolerance Patient limited by pain    Behavior During Therapy [facility] for tasks assessed/performed               Past Medical History:  Diagnosis Date   Arthritis    Cataract    Emphysema of lung (HCC)    Hypertension    Pneumonia 2015   Past Surgical History:  Procedure Laterality Date   HERNIA REPAIR  2003   TONSILLECTOMY  1939   TOTAL HIP ARTHROPLASTY Right 09/06/2022   Procedure: TOTAL HIP ARTHROPLASTY ANTERIOR APPROACH;  Surgeon: Tarry Kos, MD;  Location: [facility] OR;  Service: Orthopedics;  Laterality: Right;  3-C   VIDEO BRONCHOSCOPY Bilateral 08/11/2013   Procedure: VIDEO BRONCHOSCOPY WITH FLUORO;  Surgeon: Nyoka Cowden, MD;  Location: [facility];  Service: Cardiopulmonary;  Laterality: Bilateral;   Patient Active Problem List   Diagnosis Date Noted   Primary osteoarthritis of right hip 09/06/2022   Essential hypertension 08/14/2022   Pre-op examination 08/14/2022   Bilateral hip joint arthritis 03/13/2022   Myalgia 11/28/2021   Thoracic aortic aneurysm (HCC) 06/07/2021   Aortic atherosclerosis (HCC) 07/19/2020   Bronchiectasis without complication (HCC) 04/22/2018   Impaired fasting glucose 10/04/2014   Routine general medical examination at a health care facility 12/21/2013   Pulmonary infiltrates 07/13/2013    PCP: Hillard Danker, MD  REFERRING PROVIDER: Cristie Hem, PA-C  REFERRING DIAG: [phone] (ICD-10-CM) - History of  total hip replacement, right  THERAPY DIAG:  History of total hip replacement, right  Difficulty in walking, not elsewhere classified  Pain in right leg  Muscle weakness (generalized)  Rationale for Evaluation and Treatment: Rehabilitation  ONSET DATE: 09/06/22 DOS R THA   SUBJECTIVE:   SUBJECTIVE STATEMENT: 10/18/2022 Reports occasional sting along the side of his right leg but comes and goes not there currently .  EVAL:States that he is not in pain since the surgery moving significantly better.  Reports getting in and out of the car is a lot easier compared to preoperatively.  Reports he had home health therapy and that helps a lot.  Reports that the PA removed his bandage and stitches last week.  Reports he wants to get back to normal and is frustrated with limitations at this time.  Follows up with MD in a few weeks.  No complaints just eager to get back to independence.  Reports sometimes he does not use the cane while at home.  Daughter present during today's evaluation.  PERTINENT HISTORY: Lung emphysema, AVN in B hips PAIN:  Are you having pain? Yes: NPRS scale: 0/10 Pain location: right lateral hip Pain description: sore Aggravating factors: pressure Relieving factors: rest  PRECAUTIONS: Anterior hip  RED FLAGS: None   WEIGHT BEARING RESTRICTIONS: Yes WBAT  FALLS:  Has patient fallen in last 6 months? No  LIVING ENVIRONMENT: Lives  with: lives with their family Lives in: House/apartment Stairs: No Has following equipment at home: Single point cane  OCCUPATION: retired, active   PLOF: Independent  PATIENT GOALS: to be able to dance  NEXT MD VISIT: in 3 weeks  OBJECTIVE:     PATIENT SURVEYS:  FOTO 47%  COGNITION: Overall cognitive status: Within functional limits for tasks assessed     SENSATION: Not tested  EDEMA:  none    POSTURE: rounded shoulders, forward head, decreased lumbar lordosis, increased thoracic kyphosis, and flexed trunk    PALPATION: Tenderness in right quad/thigh   LE Measurements Lower Extremity Right EVAL Left EVAL   A/PROM MMT A/PROM MMT  Hip Flexion 100 3* WFL 4  Hip Extension      Hip Abduction      Hip Adduction      Hip Internal rotation 30     Hip External rotation 10     Knee Flexion 3+  4-   Knee Extension 3+  4   Ankle Dorsiflexion 4-  4+   Ankle Plantarflexion      Ankle Inversion      Ankle Eversion       (Blank rows = not tested) * uncomfortable    GAIT: Distance walked: 300 feet with cane wide BOS and limited hip extension   TODAY'S TREATMENT:                                                                                                                              DATE:   10/18/2022  Therapeutic Exercise:   Supine: Bridges x 25  Seated:  self mobilization with tiger tail 5 minutes R leg  Standing: walking with purposeful gait- heel toe walking, reduced lateral sway, 5 minutes total walking, steps 6" height 5 minutes, hip IR/ER on step 5 minutes bilaterally Neuromuscular Re-education: Manual Therapy: STM and cupping right  lateral distal quad. - felt good Therapeutic Activity: Self Care: Trigger Point Dry Needling:  Modalities:    PATIENT EDUCATION:  Education details: on HEP Person educated: Patient Education method: Explanation, Demonstration, and Handouts Education comprehension: verbalized understanding   HOME EXERCISE PROGRAM: [id number]  ASSESSMENT:  CLINICAL IMPRESSION: 10/18/2022 Session focused on the right lateral thigh pain.  This most likely cutaneous nerve entrapment.  Tolerated massage and cupping well with significantly less pain noted afterwards.  Stretch and less pain noted after bridges as well.  Discussed current progress as well as plan moving forward.  Improved gait with walking noted today.  EVAL:Patient is status post right hip replacement on September 06, 2002.  Overall patient is doing very well with significantly less pain  postoperatively.  Patient does demonstrate limitations in overall range of motion, strength and function.  Continues to move with wide base of support and hip and internal rotation which was how he was ambulating previously.  Educated patient and daughter in current presentation as well as plan moving forward and patient would greatly  benefit from skilled PT to improve overall physical presentation and return him to optimal function.  OBJECTIVE IMPAIRMENTS: Abnormal gait, decreased activity tolerance, decreased balance, decreased endurance, decreased knowledge of use of DME, decreased mobility, difficulty walking, decreased ROM, decreased strength, increased edema, improper body mechanics, postural dysfunction, and pain.   ACTIVITY LIMITATIONS: carrying, lifting, bending, sitting, standing, squatting, sleeping, stairs, transfers, bed mobility, bathing, dressing, and locomotion level  PARTICIPATION LIMITATIONS: cleaning, driving, community activity, and yard work  PERSONAL FACTORS: Age and 1 comorbidity: AVN in left hip  are also affecting patient's functional outcome.   REHAB POTENTIAL: Good  CLINICAL DECISION MAKING: Stable/uncomplicated  EVALUATION COMPLEXITY: Low   GOALS: Goals reviewed with patient? yes  SHORT TERM GOALS: Target date: 11/08/2022  Patient will be independent in self management strategies to improve quality of life and functional outcomes. Baseline: New Program Goal status: INITIAL  2.  Patient will report at least 25 % improvement in overall symptoms and/or function to demonstrate improved functional mobility Baseline: 0% better Goal status: INITIAL  3.  Patient will be able to ambulate for at least 25 minutes with limited gait deviations and without cane to improve community ambulation Baseline: Unable Goal status: INITIAL  4. Patient will be able to stand on right leg for at least 30 seconds to demonstrate improved weightbearing tolerance   Baseline:  Unable Goal status: INITIAL    LONG TERM GOALS: Target date: 12/20/2022   Patient will report at least 50 % improvement in overall symptoms and/or function to demonstrate improved functional mobility Baseline: 0% better Goal status: INITIAL  2.  Patient will improve score on FOTO outcomes measure to projected score to demonstrate overall improved function and QOL Baseline: see above Goal status: INITIAL  3.  Patient will be able to stand up tall without hip flexion and right hip to demonstrate improved standing posture Baseline: Unable Goal status: INITIAL       PLAN:  PT FREQUENCY:1- 2x/week for total of 16 visits  PT DURATION: 12 weeks  PLANNED INTERVENTIONS: Therapeutic exercises, Therapeutic activity, Neuromuscular re-education, Balance training, Gait training, Patient/Family education, Self Care, Joint mobilization, Joint manipulation, Stair training, Vestibular training, Canalith repositioning, Orthotic/Fit training, Prosthetic training, DME instructions, Aquatic Therapy, Dry Needling, Electrical stimulation, Spinal manipulation, Spinal mobilization, Cryotherapy, Moist heat, Taping, Traction, Ultrasound, Ionotophoresis 4mg /ml Dexamethasone, Manual therapy, and Re-evaluation.   PLAN FOR NEXT SESSION: hip ROM, and strengthening, walking   3:16 PM, 10/18/22 Tereasa Coop, DPT Physical Therapy with [facility]

## 2022-10-19 ENCOUNTER — Encounter: Payer: Self-pay | Admitting: Physician Assistant

## 2022-10-19 ENCOUNTER — Other Ambulatory Visit (INDEPENDENT_AMBULATORY_CARE_PROVIDER_SITE_OTHER): Payer: Medicare PPO

## 2022-10-19 ENCOUNTER — Ambulatory Visit: Payer: Medicare PPO | Admitting: Physician Assistant

## 2022-10-19 DIAGNOSIS — Z96641 Presence of right artificial hip joint: Secondary | ICD-10-CM

## 2022-10-19 MED ORDER — AMOXICILLIN 500 MG PO CAPS: ORAL_CAPSULE | ORAL | 2 refills | Status: DC

## 2022-10-19 NOTE — Progress Notes (Signed)
   Post-Op Visit Note   Patient: Danny Gross           Date of Birth: February 28, 1934           MRN: [id number] Visit Date: 10/19/2022 PCP: Myrlene Broker, MD   Assessment & Plan:  Chief Complaint:  Chief Complaint  Patient presents with   Right Hip - Follow-up    Right total hip arthroplasty 09/06/2022   Visit Diagnoses:  1. History of total hip replacement, right     Plan: Patient is a pleasant 88 year old gentleman who comes in today 6 weeks status post right total hip replacement 09/06/2022.  He has been doing well and is not in any pain.  He is ambulating without assistance.  He has been compliant taking a baby aspirin twice daily for DVT prophylaxis.  Examination of the right hip reveals painless hip flexion and logroll.  He is neurovascularly intact distally.  At this point, continue to advance with activity as tolerated.  Dental prophylaxis reinforced.  Follow-up in 6 weeks for recheck.  Call with concerns or questions.  Follow-Up Instructions: Return in about 6 weeks (around 11/30/2022).   Orders:  Orders Placed This Encounter  Procedures   XR Pelvis 1-2 Views   No orders of the defined types were placed in this encounter.   Imaging: XR Pelvis 1-2 Views  Result Date: 10/19/2022 Well-seated prosthesis without complication   PMFS History: Patient Active Problem List   Diagnosis Date Noted   Primary osteoarthritis of right hip 09/06/2022   Essential hypertension 08/14/2022   Pre-op examination 08/14/2022   Bilateral hip joint arthritis 03/13/2022   Myalgia 11/28/2021   Thoracic aortic aneurysm (HCC) 06/07/2021   Aortic atherosclerosis (HCC) 07/19/2020   Bronchiectasis without complication (HCC) 04/22/2018   Impaired fasting glucose 10/04/2014   Routine general medical examination at a health care facility 12/21/2013   Pulmonary infiltrates 07/13/2013   Past Medical History:  Diagnosis Date   Arthritis    Cataract    Emphysema of lung (HCC)     Hypertension    Pneumonia 2015    Family History  Problem Relation Age of Onset   Cancer Maternal Aunt        ? type     Past Surgical History:  Procedure Laterality Date   HERNIA REPAIR  2003   TONSILLECTOMY  1939   TOTAL HIP ARTHROPLASTY Right 09/06/2022   Procedure: TOTAL HIP ARTHROPLASTY ANTERIOR APPROACH;  Surgeon: Tarry Kos, MD;  Location: [facility] OR;  Service: Orthopedics;  Laterality: Right;  3-C   VIDEO BRONCHOSCOPY Bilateral 08/11/2013   Procedure: VIDEO BRONCHOSCOPY WITH FLUORO;  Surgeon: Nyoka Cowden, MD;  Location: [facility];  Service: Cardiopulmonary;  Laterality: Bilateral;   Social History   Occupational History   Occupation: Retired  Tobacco Use   Smoking status: Never   Smokeless tobacco: Never  Vaping Use   Vaping status: Never Used  Substance and Sexual Activity   Alcohol use: Yes    Alcohol/week: 8.0 standard drinks of alcohol    Types: 1 Cans of beer, 7 Standard drinks or equivalent per week    Comment: cocktail daily   Drug use: No   Sexual activity: Not Currently

## 2022-10-22 ENCOUNTER — Ambulatory Visit: Payer: Medicare PPO | Admitting: Family Medicine

## 2022-10-22 VITALS — BP 138/78 | HR 68 | Temp 97.6°F | Resp 20 | Ht 69.0 in | Wt 142.0 lb

## 2022-10-22 DIAGNOSIS — H6123 Impacted cerumen, bilateral: Secondary | ICD-10-CM

## 2022-10-22 NOTE — Progress Notes (Signed)
Assessment & Plan:  1. Bilateral impacted cerumen Both ears irrigated successfully.  Patient tolerated well.  Education provided on earwax buildup.   Follow up plan: Return if symptoms worsen or fail to improve.  Deliah Boston, MSN, APRN, FNP-C  Subjective:  HPI: Danny Gross is a 88 y.o. male presenting on 10/22/2022 for Ear Fullness (Right ear is worse than left )  Patient is accompanied by his wife, who he is okay with being present.  Patient reports right ear fullness and slightly decreased hearing.  States he had to have his ears flushed out five years ago and this feels similar.   ROS: Negative unless specifically indicated above in HPI.   Relevant past medical history reviewed and updated as indicated.   Allergies and medications reviewed and updated.   Current Outpatient Medications:    amLODipine (NORVASC) 2.5 MG tablet, Take 1 tablet (2.5 mg total) by mouth daily., Disp: 90 tablet, Rfl: 3   aspirin EC 81 MG tablet, Take 1 tablet (81 mg total) by mouth 2 (two) times daily. To be taken after surgery to prevent blood clots, Disp: 84 tablet, Rfl: 0   brimonidine-timolol (COMBIGAN) 0.2-0.5 % ophthalmic solution, Place 1 drop into both eyes every 12 (twelve) hours., Disp: , Rfl:    dorzolamide (TRUSOPT) 2 % ophthalmic solution, Place 1 drop into both eyes 2 (two) times daily., Disp: , Rfl:    guaiFENesin (MUCINEX) 600 MG 12 hr tablet, Take 600 mg by mouth 2 (two) times daily., Disp: , Rfl:    ROCKLATAN 0.02-0.005 % SOLN, Place 1 drop into both eyes at bedtime., Disp: , Rfl:    amoxicillin (AMOXIL) 500 MG capsule, Take 4 pills one hour prior to dental work (Patient not taking: Reported on 10/22/2022), Disp: 8 capsule, Rfl: 2  No Known Allergies  Objective:   BP 138/78   Pulse 68   Temp 97.6 F (36.4 C)   Resp 20   Ht 5\' 9"  (1.753 m)   Wt 142 lb (64.4 kg)   BMI 20.97 kg/m    Physical Exam Vitals reviewed.  Constitutional:      General: He is not in acute  distress.    Appearance: Normal appearance. He is not ill-appearing, toxic-appearing or diaphoretic.  HENT:     Head: Normocephalic and atraumatic.     Right Ear: Ear canal and external ear normal. There is impacted cerumen.     Left Ear: Ear canal and external ear normal. There is impacted cerumen.  Eyes:     General: No scleral icterus.       Right eye: No discharge.        Left eye: No discharge.     Conjunctiva/sclera: Conjunctivae normal.  Cardiovascular:     Rate and Rhythm: Normal rate.  Pulmonary:     Effort: Pulmonary effort is normal. No respiratory distress.  Musculoskeletal:        General: Normal range of motion.     Cervical back: Normal range of motion.  Skin:    General: Skin is warm and dry.  Neurological:     Mental Status: He is alert and oriented to person, place, and time. Mental status is at baseline.  Psychiatric:        Mood and Affect: Mood normal.        Behavior: Behavior normal.        Thought Content: Thought content normal.        Judgment: Judgment normal.

## 2022-10-23 ENCOUNTER — Ambulatory Visit: Payer: Medicare PPO | Admitting: Physical Therapy

## 2022-10-23 ENCOUNTER — Encounter: Payer: Self-pay | Admitting: Physical Therapy

## 2022-10-23 DIAGNOSIS — R262 Difficulty in walking, not elsewhere classified: Secondary | ICD-10-CM | POA: Diagnosis not present

## 2022-10-23 DIAGNOSIS — M79604 Pain in right leg: Secondary | ICD-10-CM | POA: Diagnosis not present

## 2022-10-23 DIAGNOSIS — Z96641 Presence of right artificial hip joint: Secondary | ICD-10-CM

## 2022-10-23 DIAGNOSIS — M6281 Muscle weakness (generalized): Secondary | ICD-10-CM | POA: Diagnosis not present

## 2022-10-23 NOTE — Therapy (Signed)
OUTPATIENT PHYSICAL THERAPY LOWER EXTREMITY TREATMENT   Patient Name: Danny Gross MRN: [id number] DOB:07/14/1934, 88 y.o., male Today's Date: 10/23/2022  END OF SESSION:  PT End of Session - 10/23/22 1342     Visit Number 8    Number of Visits 16    Date for PT Re-Evaluation 12/20/22    Authorization Type humana - auth 12 visits from 09/27/22 -12/08/22    Authorization - Visit Number 8    Authorization - Number of Visits 12    PT Start Time 1343    PT Stop Time 1425    PT Time Calculation (min) 42 min    Activity Tolerance Patient limited by pain    Behavior During Therapy [facility] for tasks assessed/performed               Past Medical History:  Diagnosis Date   Arthritis    Cataract    Emphysema of lung (HCC)    Hypertension    Pneumonia 2015   Past Surgical History:  Procedure Laterality Date   HERNIA REPAIR  2003   TONSILLECTOMY  1939   TOTAL HIP ARTHROPLASTY Right 09/06/2022   Procedure: TOTAL HIP ARTHROPLASTY ANTERIOR APPROACH;  Surgeon: Tarry Kos, MD;  Location: [facility] OR;  Service: Orthopedics;  Laterality: Right;  3-C   VIDEO BRONCHOSCOPY Bilateral 08/11/2013   Procedure: VIDEO BRONCHOSCOPY WITH FLUORO;  Surgeon: Nyoka Cowden, MD;  Location: [facility];  Service: Cardiopulmonary;  Laterality: Bilateral;   Patient Active Problem List   Diagnosis Date Noted   Primary osteoarthritis of right hip 09/06/2022   Essential hypertension 08/14/2022   Pre-op examination 08/14/2022   Bilateral hip joint arthritis 03/13/2022   Myalgia 11/28/2021   Thoracic aortic aneurysm (HCC) 06/07/2021   Aortic atherosclerosis (HCC) 07/19/2020   Bronchiectasis without complication (HCC) 04/22/2018   Impaired fasting glucose 10/04/2014   Routine general medical examination at a health care facility 12/21/2013   Pulmonary infiltrates 07/13/2013    PCP: Hillard Danker, MD  REFERRING PROVIDER: Cristie Hem, PA-C  REFERRING DIAG: [phone] (ICD-10-CM) - History of  total hip replacement, right  THERAPY DIAG:  History of total hip replacement, right  Difficulty in walking, not elsewhere classified  Pain in right leg  Muscle weakness (generalized)  Rationale for Evaluation and Treatment: Rehabilitation  ONSET DATE: 09/06/22 DOS R THA   SUBJECTIVE:   SUBJECTIVE STATEMENT: 10/23/2022 States he is cleared to drive. Still getting a couple of twinges  EVAL:States that he is not in pain since the surgery moving significantly better.  Reports getting in and out of the car is a lot easier compared to preoperatively.  Reports he had home health therapy and that helps a lot.  Reports that the PA removed his bandage and stitches last week.  Reports he wants to get back to normal and is frustrated with limitations at this time.  Follows up with MD in a few weeks.  No complaints just eager to get back to independence.  Reports sometimes he does not use the cane while at home.  Daughter present during today's evaluation.  PERTINENT HISTORY: Lung emphysema, AVN in R hips PAIN:  Are you having pain? Yes: NPRS scale: 0/10 Pain location: right lateral hip Pain description: sore Aggravating factors: pressure Relieving factors: rest  PRECAUTIONS: Anterior hip  RED FLAGS: None   WEIGHT BEARING RESTRICTIONS: Yes WBAT  FALLS:  Has patient fallen in last 6 months? No  LIVING ENVIRONMENT: Lives with: lives with their family Lives  in: House/apartment Stairs: No Has following equipment at home: Single point cane  OCCUPATION: retired, active   PLOF: Independent  PATIENT GOALS: to be able to dance  NEXT MD VISIT: in 3 weeks  OBJECTIVE:     PATIENT SURVEYS:  FOTO 47%  COGNITION: Overall cognitive status: Within functional limits for tasks assessed     SENSATION: Not tested  EDEMA:  none    POSTURE: rounded shoulders, forward head, decreased lumbar lordosis, increased thoracic kyphosis, and flexed trunk   PALPATION: Tenderness in  right quad/thigh   LE Measurements Lower Extremity Right EVAL Left EVAL   A/PROM MMT A/PROM MMT  Hip Flexion 100 3* WFL 4  Hip Extension      Hip Abduction      Hip Adduction      Hip Internal rotation 30     Hip External rotation 10     Knee Flexion 3+  4-   Knee Extension 3+  4   Ankle Dorsiflexion 4-  4+   Ankle Plantarflexion      Ankle Inversion      Ankle Eversion       (Blank rows = not tested) * uncomfortable    GAIT: Distance walked: 300 feet with cane wide BOS and limited hip extension   TODAY'S TREATMENT:                                                                                                                              DATE:   10/23/2022  Therapeutic Exercise:  Prone: hamstring curls x20 5-10" holds B, hip ER iso x2 rounds of 1 minutes with 5 second holds, TKE B 2x15 5" holds,  S/l: clams 3x10 B Supine:   Standing:  Seated: STS then with RTB- 10 minutes Neuromuscular Re-education: Manual Therapy:  Therapeutic Activity: Self Care: Trigger Point Dry Needling:  Modalities:    PATIENT EDUCATION:  Education details: on HEP, on driving and practicing quick breaking and stops. Person educated: Patient Education method: Explanation, Demonstration, and Handouts Education comprehension: verbalized understanding   HOME EXERCISE PROGRAM: [id number]  ASSESSMENT:  CLINICAL IMPRESSION: 10/23/2022 No twinges of pain during session. Progressed exercises focusing on controlled movements and strengthening. Tolerated well. Fatigue in leg but no pain.  Overall patient doing very well and demonstrating improved function and strength.  Educated patient in driving mechanics and practice quick stops in a parking lot to mimic needing to break quickly while driving.  Will continue with current plan of care as tolerated.  EVAL:Patient is status post right hip replacement on September 06, 2002.  Overall patient is doing very well with significantly less pain  postoperatively.  Patient does demonstrate limitations in overall range of motion, strength and function.  Continues to move with wide base of support and hip and internal rotation which was how he was ambulating previously.  Educated patient and daughter in current presentation as well as plan moving forward and patient would greatly  benefit from skilled PT to improve overall physical presentation and return him to optimal function.  OBJECTIVE IMPAIRMENTS: Abnormal gait, decreased activity tolerance, decreased balance, decreased endurance, decreased knowledge of use of DME, decreased mobility, difficulty walking, decreased ROM, decreased strength, increased edema, improper body mechanics, postural dysfunction, and pain.   ACTIVITY LIMITATIONS: carrying, lifting, bending, sitting, standing, squatting, sleeping, stairs, transfers, bed mobility, bathing, dressing, and locomotion level  PARTICIPATION LIMITATIONS: cleaning, driving, community activity, and yard work  PERSONAL FACTORS: Age and 1 comorbidity: AVN in left hip  are also affecting patient's functional outcome.   REHAB POTENTIAL: Good  CLINICAL DECISION MAKING: Stable/uncomplicated  EVALUATION COMPLEXITY: Low   GOALS: Goals reviewed with patient? yes  SHORT TERM GOALS: Target date: 11/08/2022  Patient will be independent in self management strategies to improve quality of life and functional outcomes. Baseline: New Program Goal status: INITIAL  2.  Patient will report at least 25 % improvement in overall symptoms and/or function to demonstrate improved functional mobility Baseline: 0% better Goal status: INITIAL  3.  Patient will be able to ambulate for at least 25 minutes with limited gait deviations and without cane to improve community ambulation Baseline: Unable Goal status: INITIAL  4. Patient will be able to stand on right leg for at least 30 seconds to demonstrate improved weightbearing tolerance   Baseline:  Unable Goal status: INITIAL    LONG TERM GOALS: Target date: 12/20/2022   Patient will report at least 50 % improvement in overall symptoms and/or function to demonstrate improved functional mobility Baseline: 0% better Goal status: INITIAL  2.  Patient will improve score on FOTO outcomes measure to projected score to demonstrate overall improved function and QOL Baseline: see above Goal status: INITIAL  3.  Patient will be able to stand up tall without hip flexion and right hip to demonstrate improved standing posture Baseline: Unable Goal status: INITIAL       PLAN:  PT FREQUENCY:1- 2x/week for total of 16 visits  PT DURATION: 12 weeks  PLANNED INTERVENTIONS: Therapeutic exercises, Therapeutic activity, Neuromuscular re-education, Balance training, Gait training, Patient/Family education, Self Care, Joint mobilization, Joint manipulation, Stair training, Vestibular training, Canalith repositioning, Orthotic/Fit training, Prosthetic training, DME instructions, Aquatic Therapy, Dry Needling, Electrical stimulation, Spinal manipulation, Spinal mobilization, Cryotherapy, Moist heat, Taping, Traction, Ultrasound, Ionotophoresis 4mg /ml Dexamethasone, Manual therapy, and Re-evaluation.   PLAN FOR NEXT SESSION: hip ROM, and strengthening, walking   4:19 PM, 10/23/22 Tereasa Coop, DPT Physical Therapy with [facility]

## 2022-10-25 ENCOUNTER — Ambulatory Visit: Payer: Medicare PPO | Admitting: Physical Therapy

## 2022-10-25 ENCOUNTER — Encounter: Payer: Self-pay | Admitting: Physical Therapy

## 2022-10-25 DIAGNOSIS — R262 Difficulty in walking, not elsewhere classified: Secondary | ICD-10-CM

## 2022-10-25 DIAGNOSIS — Z96641 Presence of right artificial hip joint: Secondary | ICD-10-CM

## 2022-10-25 DIAGNOSIS — M79604 Pain in right leg: Secondary | ICD-10-CM | POA: Diagnosis not present

## 2022-10-25 DIAGNOSIS — M6281 Muscle weakness (generalized): Secondary | ICD-10-CM | POA: Diagnosis not present

## 2022-10-25 NOTE — Therapy (Signed)
OUTPATIENT PHYSICAL THERAPY LOWER EXTREMITY TREATMENT   Patient Name: Danny Gross MRN: [id number] DOB:Feb 21, 1934, 88 y.o., male Today's Date: 10/25/2022  END OF SESSION:  PT End of Session - 10/25/22 1346     Visit Number 9    Number of Visits 16    Date for PT Re-Evaluation 12/20/22    Authorization Type humana - auth 12 visits from 09/27/22 -12/08/22    Authorization - Visit Number 9    Authorization - Number of Visits 12    PT Start Time 1347    PT Stop Time 1428    PT Time Calculation (min) 41 min    Activity Tolerance Patient limited by pain    Behavior During Therapy [facility] for tasks assessed/performed               Past Medical History:  Diagnosis Date   Arthritis    Cataract    Emphysema of lung (HCC)    Hypertension    Pneumonia 2015   Past Surgical History:  Procedure Laterality Date   HERNIA REPAIR  2003   TONSILLECTOMY  1939   TOTAL HIP ARTHROPLASTY Right 09/06/2022   Procedure: TOTAL HIP ARTHROPLASTY ANTERIOR APPROACH;  Surgeon: Tarry Kos, MD;  Location: [facility] OR;  Service: Orthopedics;  Laterality: Right;  3-C   VIDEO BRONCHOSCOPY Bilateral 08/11/2013   Procedure: VIDEO BRONCHOSCOPY WITH FLUORO;  Surgeon: Nyoka Cowden, MD;  Location: [facility];  Service: Cardiopulmonary;  Laterality: Bilateral;   Patient Active Problem List   Diagnosis Date Noted   Primary osteoarthritis of right hip 09/06/2022   Essential hypertension 08/14/2022   Pre-op examination 08/14/2022   Bilateral hip joint arthritis 03/13/2022   Myalgia 11/28/2021   Thoracic aortic aneurysm (HCC) 06/07/2021   Aortic atherosclerosis (HCC) 07/19/2020   Bronchiectasis without complication (HCC) 04/22/2018   Impaired fasting glucose 10/04/2014   Routine general medical examination at a health care facility 12/21/2013   Pulmonary infiltrates 07/13/2013    PCP: Hillard Danker, MD  REFERRING PROVIDER: Cristie Hem, PA-C  REFERRING DIAG: [phone] (ICD-10-CM) - History of  total hip replacement, right  THERAPY DIAG:  History of total hip replacement, right  Difficulty in walking, not elsewhere classified  Pain in right leg  Muscle weakness (generalized)  Rationale for Evaluation and Treatment: Rehabilitation  ONSET DATE: 09/06/22 DOS R THA   SUBJECTIVE:   SUBJECTIVE STATEMENT: 10/25/2022 States that his left leg was a little tweaky after standing in line to vote for 1.5 hour. Reports no pain right now.  EVAL:States that he is not in pain since the surgery moving significantly better.  Reports getting in and out of the car is a lot easier compared to preoperatively.  Reports he had home health therapy and that helps a lot.  Reports that the PA removed his bandage and stitches last week.  Reports he wants to get back to normal and is frustrated with limitations at this time.  Follows up with MD in a few weeks.  No complaints just eager to get back to independence.  Reports sometimes he does not use the cane while at home.  Daughter present during today's evaluation.  PERTINENT HISTORY: Lung emphysema, AVN in R hips PAIN:  Are you having pain? Yes: NPRS scale: 0/10 Pain location: right lateral hip Pain description: sore Aggravating factors: pressure Relieving factors: rest  PRECAUTIONS: Anterior hip  RED FLAGS: None   WEIGHT BEARING RESTRICTIONS: Yes WBAT  FALLS:  Has patient fallen in last 6 months?  No  LIVING ENVIRONMENT: Lives with: lives with their family Lives in: House/apartment Stairs: No Has following equipment at home: Single point cane  OCCUPATION: retired, active   PLOF: Independent  PATIENT GOALS: to be able to dance  NEXT MD VISIT: in 3 weeks  OBJECTIVE:     PATIENT SURVEYS:  FOTO 47%  COGNITION: Overall cognitive status: Within functional limits for tasks assessed     SENSATION: Not tested  EDEMA:  none    POSTURE: rounded shoulders, forward head, decreased lumbar lordosis, increased thoracic  kyphosis, and flexed trunk   PALPATION: Tenderness in right quad/thigh   LE Measurements Lower Extremity Right EVAL Left EVAL   A/PROM MMT A/PROM MMT  Hip Flexion 100 3* WFL 4  Hip Extension      Hip Abduction      Hip Adduction      Hip Internal rotation 30     Hip External rotation 10     Knee Flexion 3+  4-   Knee Extension 3+  4   Ankle Dorsiflexion 4-  4+   Ankle Plantarflexion      Ankle Inversion      Ankle Eversion       (Blank rows = not tested) * uncomfortable    GAIT: Distance walked: 300 feet with cane wide BOS and limited hip extension   TODAY'S TREATMENT:                                                                                                                              DATE:   10/25/2022  Therapeutic Exercise:  Aerobic: bike L1/2 5 minutes total Supine: legs 90/90 on ball SAQs 3 minutes alternating. Hamstring curls x30 5" hlds on ball, bridges 3 x 5 5-second holds Prone:   Supine:   Standing:  Seated: self mobilization to quads with tiger tail 6 minutes Neuromuscular Re-education: Manual Therapy: STM to left quad - tolerated well, left patella mobilization in all directions Therapeutic Activity: Self Care: Trigger Point Dry Needling:  Modalities:    PATIENT EDUCATION:  Education details: on HEP, on current presentation and how to address symptoms Person educated: Patient Education method: Explanation, Demonstration, and Handouts Education comprehension: verbalized understanding   HOME EXERCISE PROGRAM: [id number]  ASSESSMENT:  CLINICAL IMPRESSION: 10/25/2022 Slight twinge of pain occasionally throughout session along left distal quad near kneecap.  Performed stretches and manual mobilization to this area and twinges reduced.  Educated patient in self mobilization of this muscle to help reduce twinges.  Most likely standing for over an hour and a half and possibly favoring left leg is what brought on recent symptoms.  Will  continue to monitor and address as needed.  No pain noted end of session.  Will continue with current plan of care as tolerated. EVAL:Patient is status post right hip replacement on September 06, 2002.  Overall patient is doing very well with significantly less pain postoperatively.  Patient does demonstrate limitations  in overall range of motion, strength and function.  Continues to move with wide base of support and hip and internal rotation which was how he was ambulating previously.  Educated patient and daughter in current presentation as well as plan moving forward and patient would greatly benefit from skilled PT to improve overall physical presentation and return him to optimal function.  OBJECTIVE IMPAIRMENTS: Abnormal gait, decreased activity tolerance, decreased balance, decreased endurance, decreased knowledge of use of DME, decreased mobility, difficulty walking, decreased ROM, decreased strength, increased edema, improper body mechanics, postural dysfunction, and pain.   ACTIVITY LIMITATIONS: carrying, lifting, bending, sitting, standing, squatting, sleeping, stairs, transfers, bed mobility, bathing, dressing, and locomotion level  PARTICIPATION LIMITATIONS: cleaning, driving, community activity, and yard work  PERSONAL FACTORS: Age and 1 comorbidity: AVN in left hip  are also affecting patient's functional outcome.   REHAB POTENTIAL: Good  CLINICAL DECISION MAKING: Stable/uncomplicated  EVALUATION COMPLEXITY: Low   GOALS: Goals reviewed with patient? yes  SHORT TERM GOALS: Target date: 11/08/2022  Patient will be independent in self management strategies to improve quality of life and functional outcomes. Baseline: New Program Goal status: INITIAL  2.  Patient will report at least 25 % improvement in overall symptoms and/or function to demonstrate improved functional mobility Baseline: 0% better Goal status: INITIAL  3.  Patient will be able to ambulate for at least 25  minutes with limited gait deviations and without cane to improve community ambulation Baseline: Unable Goal status: INITIAL  4. Patient will be able to stand on right leg for at least 30 seconds to demonstrate improved weightbearing tolerance   Baseline: Unable Goal status: INITIAL    LONG TERM GOALS: Target date: 12/20/2022   Patient will report at least 50 % improvement in overall symptoms and/or function to demonstrate improved functional mobility Baseline: 0% better Goal status: INITIAL  2.  Patient will improve score on FOTO outcomes measure to projected score to demonstrate overall improved function and QOL Baseline: see above Goal status: INITIAL  3.  Patient will be able to stand up tall without hip flexion and right hip to demonstrate improved standing posture Baseline: Unable Goal status: INITIAL       PLAN:  PT FREQUENCY:1- 2x/week for total of 16 visits  PT DURATION: 12 weeks  PLANNED INTERVENTIONS: Therapeutic exercises, Therapeutic activity, Neuromuscular re-education, Balance training, Gait training, Patient/Family education, Self Care, Joint mobilization, Joint manipulation, Stair training, Vestibular training, Canalith repositioning, Orthotic/Fit training, Prosthetic training, DME instructions, Aquatic Therapy, Dry Needling, Electrical stimulation, Spinal manipulation, Spinal mobilization, Cryotherapy, Moist heat, Taping, Traction, Ultrasound, Ionotophoresis 4mg /ml Dexamethasone, Manual therapy, and Re-evaluation.   PLAN FOR NEXT SESSION: Progress note, hip ROM, and strengthening, walking   2:54 PM, 10/25/22 Tereasa Coop, DPT Physical Therapy with [facility]

## 2022-10-30 ENCOUNTER — Ambulatory Visit: Payer: Medicare PPO | Admitting: Physical Therapy

## 2022-10-30 ENCOUNTER — Encounter: Payer: Self-pay | Admitting: Physical Therapy

## 2022-10-30 DIAGNOSIS — Z96641 Presence of right artificial hip joint: Secondary | ICD-10-CM

## 2022-10-30 DIAGNOSIS — M79604 Pain in right leg: Secondary | ICD-10-CM

## 2022-10-30 DIAGNOSIS — R262 Difficulty in walking, not elsewhere classified: Secondary | ICD-10-CM | POA: Diagnosis not present

## 2022-10-30 DIAGNOSIS — M6281 Muscle weakness (generalized): Secondary | ICD-10-CM | POA: Diagnosis not present

## 2022-10-30 NOTE — Therapy (Signed)
OUTPATIENT PHYSICAL THERAPY LOWER EXTREMITY TREATMENT       Patient Name: Danny Gross MRN: [id number] DOB:1934-12-30, 88 y.o., male Today's Date: 10/30/2022  END OF SESSION:  PT End of Session - 10/30/22 1347     Visit Number 10    Number of Visits 16    Date for PT Re-Evaluation 12/20/22    Authorization Type humana - auth 12 visits from 09/27/22 -12/08/22    Authorization - Visit Number 10    Authorization - Number of Visits 12    PT Start Time 1348    PT Stop Time 1428    PT Time Calculation (min) 40 min    Activity Tolerance Patient limited by pain    Behavior During Therapy [facility] for tasks assessed/performed               Past Medical History:  Diagnosis Date   Arthritis    Cataract    Emphysema of lung (HCC)    Hypertension    Pneumonia 2015   Past Surgical History:  Procedure Laterality Date   HERNIA REPAIR  2003   TONSILLECTOMY  1939   TOTAL HIP ARTHROPLASTY Right 09/06/2022   Procedure: TOTAL HIP ARTHROPLASTY ANTERIOR APPROACH;  Surgeon: Tarry Kos, MD;  Location: [facility] OR;  Service: Orthopedics;  Laterality: Right;  3-C   VIDEO BRONCHOSCOPY Bilateral 08/11/2013   Procedure: VIDEO BRONCHOSCOPY WITH FLUORO;  Surgeon: Nyoka Cowden, MD;  Location: [facility];  Service: Cardiopulmonary;  Laterality: Bilateral;   Patient Active Problem List   Diagnosis Date Noted   Primary osteoarthritis of right hip 09/06/2022   Essential hypertension 08/14/2022   Pre-op examination 08/14/2022   Bilateral hip joint arthritis 03/13/2022   Myalgia 11/28/2021   Thoracic aortic aneurysm (HCC) 06/07/2021   Aortic atherosclerosis (HCC) 07/19/2020   Bronchiectasis without complication (HCC) 04/22/2018   Impaired fasting glucose 10/04/2014   Routine general medical examination at a health care facility 12/21/2013   Pulmonary infiltrates 07/13/2013    PCP: Hillard Danker, MD  REFERRING PROVIDER: Cristie Hem, PA-C  REFERRING DIAG: [phone] (ICD-10-CM) -  History of total hip replacement, right  THERAPY DIAG:  History of total hip replacement, right  Difficulty in walking, not elsewhere classified  Pain in right leg  Muscle weakness (generalized)  Rationale for Evaluation and Treatment: Rehabilitation  ONSET DATE: 09/06/22 DOS R THA   SUBJECTIVE:   SUBJECTIVE STATEMENT: 10/30/2022 No more twinges of pain. Has been doing some of his exercises. Reports no pain currently.   EVAL:States that he is not in pain since the surgery moving significantly better.  Reports getting in and out of the car is a lot easier compared to preoperatively.  Reports he had home health therapy and that helps a lot.  Reports that the PA removed his bandage and stitches last week.  Reports he wants to get back to normal and is frustrated with limitations at this time.  Follows up with MD in a few weeks.  No complaints just eager to get back to independence.  Reports sometimes he does not use the cane while at home.  Daughter present during today's evaluation.  PERTINENT HISTORY: Lung emphysema, AVN in R hips PAIN:  Are you having pain? Yes: NPRS scale: 0/10 Pain location: right lateral hip Pain description: sore Aggravating factors: pressure Relieving factors: rest  PRECAUTIONS: Anterior hip  RED FLAGS: None   WEIGHT BEARING RESTRICTIONS: Yes WBAT  FALLS:  Has patient fallen in last 6 months? No  LIVING ENVIRONMENT: Lives with: lives with their family Lives in: House/apartment Stairs: No Has following equipment at home: Single point cane  OCCUPATION: retired, active   PLOF: Independent  PATIENT GOALS: to be able to dance  NEXT MD VISIT: in 3 weeks  OBJECTIVE:     PATIENT SURVEYS:  FOTO 47%  COGNITION: Overall cognitive status: Within functional limits for tasks assessed     SENSATION: Not tested  EDEMA:  none    POSTURE: rounded shoulders, forward head, decreased lumbar lordosis, increased thoracic kyphosis, and flexed  trunk   PALPATION: Tenderness in right quad/thigh   LE Measurements Lower Extremity Right EVAL Left EVAL   A/PROM MMT A/PROM MMT  Hip Flexion 100 3* WFL 4  Hip Extension      Hip Abduction      Hip Adduction      Hip Internal rotation 30     Hip External rotation 10     Knee Flexion 3+  4-   Knee Extension 3+  4   Ankle Dorsiflexion 4-  4+   Ankle Plantarflexion      Ankle Inversion      Ankle Eversion       (Blank rows = not tested) * uncomfortable    GAIT: Distance walked: 300 feet with cane wide BOS and limited hip extension   TODAY'S TREATMENT:                                                                                                                              DATE:   10/30/2022  Therapeutic Exercise:  Aerobic:  Supine: review of all HEP exercises with practice 15 minutes Supine:   Standing: facing wall tall posture hands up wall x15 10" holds, staggered stance at 6" step with tall posture and tactile cues for posture and reduced weight shift 6 minutes, SLS with contralateral support and tall posture x3 30" holds B, suitcase walks x3 lap 7# B, then 10# x3 laps B Seated: Sit to stand with cues to press hips wide x 12 slow and controlled motion Neuromuscular Re-education: Manual Therapy:  directions Therapeutic Activity: Self Care: Trigger Point Dry Needling:  Modalities:    PATIENT EDUCATION:  Education details: on HEP, on current presentation and how to address symptoms Person educated: Patient Education method: Explanation, Demonstration, and Handouts Education comprehension: verbalized understanding   HOME EXERCISE PROGRAM: [id number]  ASSESSMENT:  CLINICAL IMPRESSION: 10/30/2022 Session focused on review of home exercise program and answering all questions.  Tolerated this moderately well but patient performing limited exercises at home in regards to postop rehab.  Continue to cue patient for a hip muscle activation.  Continues to  tolerate functional activities and close chain exercises better than open chain.  No pain noted during or after session but fatigues in hips noted end of session.  Added suitcase carry with 10 pound weight to home exercise program as patient enjoyed this exercise and reported muscle fatigue  and hips.  Will continue with current plan of care as tolerated  EVAL:Patient is status post right hip replacement on September 06, 2002.  Overall patient is doing very well with significantly less pain postoperatively.  Patient does demonstrate limitations in overall range of motion, strength and function.  Continues to move with wide base of support and hip and internal rotation which was how he was ambulating previously.  Educated patient and daughter in current presentation as well as plan moving forward and patient would greatly benefit from skilled PT to improve overall physical presentation and return him to optimal function.  OBJECTIVE IMPAIRMENTS: Abnormal gait, decreased activity tolerance, decreased balance, decreased endurance, decreased knowledge of use of DME, decreased mobility, difficulty walking, decreased ROM, decreased strength, increased edema, improper body mechanics, postural dysfunction, and pain.   ACTIVITY LIMITATIONS: carrying, lifting, bending, sitting, standing, squatting, sleeping, stairs, transfers, bed mobility, bathing, dressing, and locomotion level  PARTICIPATION LIMITATIONS: cleaning, driving, community activity, and yard work  PERSONAL FACTORS: Age and 1 comorbidity: AVN in left hip  are also affecting patient's functional outcome.   REHAB POTENTIAL: Good  CLINICAL DECISION MAKING: Stable/uncomplicated  EVALUATION COMPLEXITY: Low   GOALS: Goals reviewed with patient? yes  SHORT TERM GOALS: Target date: 11/08/2022  Patient will be independent in self management strategies to improve quality of life and functional outcomes. Baseline: New Program Goal status: PROGRESSING    2.  Patient will report at least 25 % improvement in overall symptoms and/or function to demonstrate improved functional mobility Baseline: 0% better Goal status: MET  3.  Patient will be able to ambulate for at least 25 minutes with limited gait deviations and without cane to improve community ambulation Baseline: Unable Goal status: PROGRESSING  4. Patient will be able to stand on right leg for at least 30 seconds to demonstrate improved weightbearing tolerance   Baseline: Unable Goal status: INITIAL    LONG TERM GOALS: Target date: 12/20/2022   Patient will report at least 50 % improvement in overall symptoms and/or function to demonstrate improved functional mobility Baseline: 0% better Goal status: MET  2.  Patient will improve score on FOTO outcomes measure to projected score to demonstrate overall improved function and QOL Baseline: see above Goal status: INITIAL  3.  Patient will be able to stand up tall without hip flexion and right hip to demonstrate improved standing posture Baseline: Unable Goal status: PROGRESSING       PLAN:  PT FREQUENCY:1- 2x/week for total of 16 visits  PT DURATION: 12 weeks  PLANNED INTERVENTIONS: Therapeutic exercises, Therapeutic activity, Neuromuscular re-education, Balance training, Gait training, Patient/Family education, Self Care, Joint mobilization, Joint manipulation, Stair training, Vestibular training, Canalith repositioning, Orthotic/Fit training, Prosthetic training, DME instructions, Aquatic Therapy, Dry Needling, Electrical stimulation, Spinal manipulation, Spinal mobilization, Cryotherapy, Moist heat, Taping, Traction, Ultrasound, Ionotophoresis 4mg /ml Dexamethasone, Manual therapy, and Re-evaluation.   PLAN FOR NEXT SESSION: Progress note, hip ROM, and strengthening, walking   2:57 PM, 10/30/22 Tereasa Coop, DPT Physical Therapy with [facility]

## 2022-11-06 ENCOUNTER — Ambulatory Visit: Payer: Medicare PPO | Admitting: Physical Therapy

## 2022-11-06 ENCOUNTER — Encounter: Payer: Self-pay | Admitting: Physical Therapy

## 2022-11-06 DIAGNOSIS — R262 Difficulty in walking, not elsewhere classified: Secondary | ICD-10-CM | POA: Diagnosis not present

## 2022-11-06 DIAGNOSIS — M6281 Muscle weakness (generalized): Secondary | ICD-10-CM

## 2022-11-06 DIAGNOSIS — Z96641 Presence of right artificial hip joint: Secondary | ICD-10-CM | POA: Diagnosis not present

## 2022-11-06 DIAGNOSIS — M79604 Pain in right leg: Secondary | ICD-10-CM | POA: Diagnosis not present

## 2022-11-06 NOTE — Therapy (Signed)
OUTPATIENT PHYSICAL THERAPY LOWER EXTREMITY TREATMENT   Progress Note Reporting Period 09/27/22 to 11/06/22  See note below for Objective Data and Assessment of Progress/Goals.        Patient Name: Danny Gross MRN: [id number] DOB:July 29, 1934, 88 y.o., male Today's Date: 11/06/2022  END OF SESSION:  PT End of Session - 11/06/22 1352     Visit Number 11    Number of Visits 16    Date for PT Re-Evaluation 12/20/22    Authorization Type humana - auth 12 visits from 09/27/22 -12/08/22    Authorization - Visit Number 11    Authorization - Number of Visits 12    PT Start Time 1353    PT Stop Time 1433    PT Time Calculation (min) 40 min    Activity Tolerance Patient limited by pain    Behavior During Therapy [facility] for tasks assessed/performed               Past Medical History:  Diagnosis Date   Arthritis    Cataract    Emphysema of lung (HCC)    Hypertension    Pneumonia 2015   Past Surgical History:  Procedure Laterality Date   HERNIA REPAIR  2003   TONSILLECTOMY  1939   TOTAL HIP ARTHROPLASTY Right 09/06/2022   Procedure: TOTAL HIP ARTHROPLASTY ANTERIOR APPROACH;  Surgeon: Tarry Kos, MD;  Location: [facility] OR;  Service: Orthopedics;  Laterality: Right;  3-C   VIDEO BRONCHOSCOPY Bilateral 08/11/2013   Procedure: VIDEO BRONCHOSCOPY WITH FLUORO;  Surgeon: Nyoka Cowden, MD;  Location: [facility];  Service: Cardiopulmonary;  Laterality: Bilateral;   Patient Active Problem List   Diagnosis Date Noted   Primary osteoarthritis of right hip 09/06/2022   Essential hypertension 08/14/2022   Pre-op examination 08/14/2022   Bilateral hip joint arthritis 03/13/2022   Myalgia 11/28/2021   Thoracic aortic aneurysm (HCC) 06/07/2021   Aortic atherosclerosis (HCC) 07/19/2020   Bronchiectasis without complication (HCC) 04/22/2018   Impaired fasting glucose 10/04/2014   Routine general medical examination at a health care facility 12/21/2013   Pulmonary infiltrates  07/13/2013    PCP: Hillard Danker, MD  REFERRING PROVIDER: Cristie Hem, PA-C  REFERRING DIAG: [phone] (ICD-10-CM) - History of total hip replacement, right  THERAPY DIAG:  History of total hip replacement, right  Difficulty in walking, not elsewhere classified  Pain in right leg  Muscle weakness (generalized)  Rationale for Evaluation and Treatment: Rehabilitation  ONSET DATE: 09/06/22 DOS R THA   SUBJECTIVE:   SUBJECTIVE STATEMENT: 11/06/2022 States they walking 1.5 miles and his left leg is talking to him. Overall he feels 100% better since the start of PT.   EVAL:States that he is not in pain since the surgery moving significantly better.  Reports getting in and out of the car is a lot easier compared to preoperatively.  Reports he had home health therapy and that helps a lot.  Reports that the PA removed his bandage and stitches last week.  Reports he wants to get back to normal and is frustrated with limitations at this time.  Follows up with MD in a few weeks.  No complaints just eager to get back to independence.  Reports sometimes he does not use the cane while at home.  Daughter present during today's evaluation.  PERTINENT HISTORY: Lung emphysema, AVN in R hips PAIN:  Are you having pain? Yes: NPRS scale: 0/10 Pain location: right lateral hip Pain description: sore Aggravating factors: pressure Relieving factors:  rest  PRECAUTIONS: Anterior hip  RED FLAGS: None   WEIGHT BEARING RESTRICTIONS: Yes WBAT  FALLS:  Has patient fallen in last 6 months? No  LIVING ENVIRONMENT: Lives with: lives with their family Lives in: House/apartment Stairs: No Has following equipment at home: Single point cane  OCCUPATION: retired, active   PLOF: Independent  PATIENT GOALS: to be able to dance  NEXT MD VISIT: in 3 weeks  OBJECTIVE:     PATIENT SURVEYS:  FOTO 47%  COGNITION: Overall cognitive status: Within functional limits for tasks  assessed     SENSATION: Not tested  EDEMA:  none    POSTURE: rounded shoulders, forward head, decreased lumbar lordosis, increased thoracic kyphosis, and flexed trunk   PALPATION: Tenderness in right quad/thigh   LE Measurements Lower Extremity Right 10/29 Left 10/29   A/PROM MMT A/PROM MMT  Hip Flexion 100 3* WFL 4  Hip Extension      Hip Abduction      Hip Adduction      Hip Internal rotation 30     Hip External rotation 10     Knee Flexion 3+  4-   Knee Extension 3+  4   Ankle Dorsiflexion 4-  4+   Ankle Plantarflexion      Ankle Inversion      Ankle Eversion       (Blank rows = not tested) * uncomfortable    GAIT: Distance walked: 300 feet with cane wide BOS and limited hip extension SLS: 2 seconds on either leg no UE support 11/06/22  TODAY'S TREATMENT:                                                                                                                              DATE:   11/06/2022  Therapeutic Exercise: Objective measures updated  Neuromuscular Re-education:step balance on foam pad x3 30" holds, then with turn turns 3x5 B, narrow BOS on foam pad EO and EC 30" holds SBA x3 each, narrow BOS on foam with head turns 3x5 B Manual Therapy:   Therapeutic Activity: Self Care: Trigger Point Dry Needling:  Modalities:    PATIENT EDUCATION:  Education details: on HEP  Person educated: Patient Education method: Explanation, Facilities manager, and Handouts Education comprehension: verbalized understanding   HOME EXERCISE PROGRAM: [id number]  ASSESSMENT:  CLINICAL IMPRESSION: 11/06/2022 Overall patient doing very well and has met all but balance goal at this time.  Focused on balance and home exercise review as well as progress made well in therapy.  Answered all questions and discussed plan moving forward.  Will continue with current plan of care to continue to progress balance to reduce risk of falling and improve overall function at home and  in the community.  EVAL:Patient is status post right hip replacement on September 06, 2002.  Overall patient is doing very well with significantly less pain postoperatively.  Patient does demonstrate limitations in overall range of motion, strength and function.  Continues to move with wide base of support and hip and internal rotation which was how he was ambulating previously.  Educated patient and daughter in current presentation as well as plan moving forward and patient would greatly benefit from skilled PT to improve overall physical presentation and return him to optimal function.  OBJECTIVE IMPAIRMENTS: Abnormal gait, decreased activity tolerance, decreased balance, decreased endurance, decreased knowledge of use of DME, decreased mobility, difficulty walking, decreased ROM, decreased strength, increased edema, improper body mechanics, postural dysfunction, and pain.   ACTIVITY LIMITATIONS: carrying, lifting, bending, sitting, standing, squatting, sleeping, stairs, transfers, bed mobility, bathing, dressing, and locomotion level  PARTICIPATION LIMITATIONS: cleaning, driving, community activity, and yard work  PERSONAL FACTORS: Age and 1 comorbidity: AVN in left hip  are also affecting patient's functional outcome.   REHAB POTENTIAL: Good  CLINICAL DECISION MAKING: Stable/uncomplicated  EVALUATION COMPLEXITY: Low   GOALS: Goals reviewed with patient? yes  SHORT TERM GOALS: Target date: 11/08/2022  Patient will be independent in self management strategies to improve quality of life and functional outcomes. Baseline: New Program Goal status: MET   2.  Patient will report at least 25 % improvement in overall symptoms and/or function to demonstrate improved functional mobility Baseline: 0% better Goal status: MET  3.  Patient will be able to ambulate for at least 25 minutes with limited gait deviations and without cane to improve community ambulation Baseline: Unable Goal status:  MET  4. Patient will be able to stand on right leg for at least 30 seconds to demonstrate improved weightbearing tolerance   Baseline: Unable Goal status: PROGRESSING    LONG TERM GOALS: Target date: 12/20/2022   Patient will report at least 50 % improvement in overall symptoms and/or function to demonstrate improved functional mobility Baseline: 0% better Goal status: MET  2.  Patient will improve score on FOTO outcomes measure to projected score to demonstrate overall improved function and QOL Baseline: see above Goal status: MET  3.  Patient will be able to stand up tall without hip flexion and right hip to demonstrate improved standing posture Baseline: Unable Goal status: MET       PLAN:  PT FREQUENCY:1- 2x/week for total of 16 visits  PT DURATION: 12 weeks  PLANNED INTERVENTIONS: Therapeutic exercises, Therapeutic activity, Neuromuscular re-education, Balance training, Gait training, Patient/Family education, Self Care, Joint mobilization, Joint manipulation, Stair training, Vestibular training, Canalith repositioning, Orthotic/Fit training, Prosthetic training, DME instructions, Aquatic Therapy, Dry Needling, Electrical stimulation, Spinal manipulation, Spinal mobilization, Cryotherapy, Moist heat, Taping, Traction, Ultrasound, Ionotophoresis 4mg /ml Dexamethasone, Manual therapy, and Re-evaluation.   PLAN FOR NEXT SESSION: Ethlyn Gallery, progress balance, hip ROM, and strengthening, walking   2:55 PM, 11/06/22 Tereasa Coop, DPT Physical Therapy with [facility]

## 2022-11-08 ENCOUNTER — Ambulatory Visit: Payer: Medicare PPO | Admitting: Physical Therapy

## 2022-11-08 ENCOUNTER — Encounter: Payer: Self-pay | Admitting: Physical Therapy

## 2022-11-08 DIAGNOSIS — Z96641 Presence of right artificial hip joint: Secondary | ICD-10-CM | POA: Diagnosis not present

## 2022-11-08 DIAGNOSIS — R262 Difficulty in walking, not elsewhere classified: Secondary | ICD-10-CM | POA: Diagnosis not present

## 2022-11-08 DIAGNOSIS — M79604 Pain in right leg: Secondary | ICD-10-CM

## 2022-11-08 DIAGNOSIS — M6281 Muscle weakness (generalized): Secondary | ICD-10-CM | POA: Diagnosis not present

## 2022-11-08 NOTE — Therapy (Signed)
OUTPATIENT PHYSICAL THERAPY LOWER EXTREMITY TREATMENT  Patient Name: Danny Gross MRN: [id number] DOB:[birth year], 88 y.o., male Today's Date: 11/08/2022  END OF SESSION:  PT End of Session - 11/08/22 1354     Visit Number 12    Number of Visits 16    Date for PT Re-Evaluation 12/20/22    Authorization Type humana - auth 12 visits from 09/27/22 -12/08/22    Authorization - Visit Number 12    Authorization - Number of Visits 12    PT Start Time 1355   late to check in   PT Stop Time 1425    PT Time Calculation (min) 30 min    Activity Tolerance Patient limited by pain    Behavior During Therapy [facility] for tasks assessed/performed               Past Medical History:  Diagnosis Date   Arthritis    Cataract    Emphysema of lung (HCC)    Hypertension    Pneumonia 2015   Past Surgical History:  Procedure Laterality Date   HERNIA REPAIR  2003   TONSILLECTOMY  1939   TOTAL HIP ARTHROPLASTY Right 09/06/2022   Procedure: TOTAL HIP ARTHROPLASTY ANTERIOR APPROACH;  Surgeon: Tarry Kos, MD;  Location: [facility] OR;  Service: Orthopedics;  Laterality: Right;  3-C   VIDEO BRONCHOSCOPY Bilateral 08/11/2013   Procedure: VIDEO BRONCHOSCOPY WITH FLUORO;  Surgeon: Nyoka Cowden, MD;  Location: [facility];  Service: Cardiopulmonary;  Laterality: Bilateral;   Patient Active Problem List   Diagnosis Date Noted   Primary osteoarthritis of right hip 09/06/2022   Essential hypertension 08/14/2022   Pre-op examination 08/14/2022   Bilateral hip joint arthritis 03/13/2022   Myalgia 11/28/2021   Thoracic aortic aneurysm (HCC) 06/07/2021   Aortic atherosclerosis (HCC) 07/19/2020   Bronchiectasis without complication (HCC) 04/22/2018   Impaired fasting glucose 10/04/2014   Routine general medical examination at a health care facility 12/21/2013   Pulmonary infiltrates 07/13/2013    PCP: Hillard Danker, MD  REFERRING PROVIDER: Cristie Hem, PA-C  REFERRING DIAG: [phone]  (ICD-10-CM) - History of total hip replacement, right  THERAPY DIAG:  History of total hip replacement, right  Difficulty in walking, not elsewhere classified  Pain in right leg  Muscle weakness (generalized)  Rationale for Evaluation and Treatment: Rehabilitation  ONSET DATE: 09/06/22 DOS R THA   SUBJECTIVE:   SUBJECTIVE STATEMENT: 11/08/2022 States that he tried his balance exercises and  need help with straightening up and walking.  EVAL:States that he is not in pain since the surgery moving significantly better.  Reports getting in and out of the car is a lot easier compared to preoperatively.  Reports he had home health therapy and that helps a lot.  Reports that the PA removed his bandage and stitches last week.  Reports he wants to get back to normal and is frustrated with limitations at this time.  Follows up with MD in a few weeks.  No complaints just eager to get back to independence.  Reports sometimes he does not use the cane while at home.  Daughter present during today's evaluation.  PERTINENT HISTORY: Lung emphysema, AVN in R hips PAIN:  Are you having pain? Yes: NPRS scale: 0/10 Pain location: right lateral hip Pain description: sore Aggravating factors: pressure Relieving factors: rest  PRECAUTIONS: Anterior hip  RED FLAGS: None   WEIGHT BEARING RESTRICTIONS: Yes WBAT  FALLS:  Has patient fallen in last 6 months? No  LIVING  ENVIRONMENT: Lives with: lives with their family Lives in: House/apartment Stairs: No Has following equipment at home: Single point cane  OCCUPATION: retired, active   PLOF: Independent  PATIENT GOALS: to be able to dance  NEXT MD VISIT: in 3 weeks  OBJECTIVE:     PATIENT SURVEYS:  FOTO 47%  COGNITION: Overall cognitive status: Within functional limits for tasks assessed     SENSATION: Not tested  EDEMA:  none    POSTURE: rounded shoulders, forward head, decreased lumbar lordosis, increased thoracic  kyphosis, and flexed trunk   PALPATION: Tenderness in right quad/thigh   LE Measurements Lower Extremity Right 10/29 Left 10/29   A/PROM MMT A/PROM MMT  Hip Flexion 100 3* WFL 4  Hip Extension      Hip Abduction      Hip Adduction      Hip Internal rotation 30     Hip External rotation 10     Knee Flexion 3+  4-   Knee Extension 3+  4   Ankle Dorsiflexion 4-  4+   Ankle Plantarflexion      Ankle Inversion      Ankle Eversion       (Blank rows = not tested) * uncomfortable    GAIT: Distance walked: 300 feet with cane wide BOS and limited hip extension SLS: 2 seconds on either leg no UE support 11/06/22  TODAY'S TREATMENT:                                                                                                                              DATE:   11/08/2022  Therapeutic Exercise: TKE at wall tall posture 3 minutes, STS with overhead reach x12 then with 5# x12, step up 6" stairs - 1 hand support - cues for hip abd/ knee out to the side 2x12 B, tall side way stepping with  with posterior support x1 then with anterior support x1  Neuromuscular Re-education:  Manual Therapy:   Therapeutic Activity: Self Care: Trigger Point Dry Needling:  Modalities:    PATIENT EDUCATION:  Education details: on HEP  Person educated: Patient Education method: Explanation, Demonstration, and Handouts Education comprehension: verbalized understanding   HOME EXERCISE PROGRAM: [id number]  ASSESSMENT:  CLINICAL IMPRESSION: 11/08/2022 Today's session focused on promoting upright trunk posture and glute activation during dynamic movements. Pt able to perform suitcase carries and lateral walking with improved trunk posture and decreased gait compensations (R>L). Pt continues to have difficulty maintaining upright trunk posture secondary to muscular endurance and glute activation leading to an increase in BOS and dynamic knee valgus (L>R)-although he is steadily progressing. Pt  completed all interventions with no increase pain or modification. Plan: Continue to progress upright trunk posture, balance, glute activation, and Bil muscular endurance interventions as tolerated.     EVAL:Patient is status post right hip replacement on September 06, 2002.  Overall patient is doing very well with significantly less pain postoperatively.  Patient does  demonstrate limitations in overall range of motion, strength and function.  Continues to move with wide base of support and hip and internal rotation which was how he was ambulating previously.  Educated patient and daughter in current presentation as well as plan moving forward and patient would greatly benefit from skilled PT to improve overall physical presentation and return him to optimal function.  OBJECTIVE IMPAIRMENTS: Abnormal gait, decreased activity tolerance, decreased balance, decreased endurance, decreased knowledge of use of DME, decreased mobility, difficulty walking, decreased ROM, decreased strength, increased edema, improper body mechanics, postural dysfunction, and pain.   ACTIVITY LIMITATIONS: carrying, lifting, bending, sitting, standing, squatting, sleeping, stairs, transfers, bed mobility, bathing, dressing, and locomotion level  PARTICIPATION LIMITATIONS: cleaning, driving, community activity, and yard work  PERSONAL FACTORS: Age and 1 comorbidity: AVN in left hip  are also affecting patient's functional outcome.   REHAB POTENTIAL: Good  CLINICAL DECISION MAKING: Stable/uncomplicated  EVALUATION COMPLEXITY: Low   GOALS: Goals reviewed with patient? yes  SHORT TERM GOALS: Target date: 11/08/2022  Patient will be independent in self management strategies to improve quality of life and functional outcomes. Baseline: New Program Goal status: MET   2.  Patient will report at least 25 % improvement in overall symptoms and/or function to demonstrate improved functional mobility Baseline: 0% better Goal  status: MET  3.  Patient will be able to ambulate for at least 25 minutes with limited gait deviations and without cane to improve community ambulation Baseline: Unable Goal status: MET  4. Patient will be able to stand on right leg for at least 30 seconds to demonstrate improved weightbearing tolerance   Baseline: Unable Goal status: PROGRESSING    LONG TERM GOALS: Target date: 12/20/2022   Patient will report at least 50 % improvement in overall symptoms and/or function to demonstrate improved functional mobility Baseline: 0% better Goal status: MET  2.  Patient will improve score on FOTO outcomes measure to projected score to demonstrate overall improved function and QOL Baseline: see above Goal status: MET  3.  Patient will be able to stand up tall without hip flexion and right hip to demonstrate improved standing posture Baseline: Unable Goal status: MET       PLAN:  PT FREQUENCY:1- 2x/week for total of 16 visits  PT DURATION: 12 weeks  PLANNED INTERVENTIONS: Therapeutic exercises, Therapeutic activity, Neuromuscular re-education, Balance training, Gait training, Patient/Family education, Self Care, Joint mobilization, Joint manipulation, Stair training, Vestibular training, Canalith repositioning, Orthotic/Fit training, Prosthetic training, DME instructions, Aquatic Therapy, Dry Needling, Electrical stimulation, Spinal manipulation, Spinal mobilization, Cryotherapy, Moist heat, Taping, Traction, Ultrasound, Ionotophoresis 4mg /ml Dexamethasone, Manual therapy, and Re-evaluation.   PLAN FOR NEXT SESSION: Ethlyn Gallery, progress balance, hip ROM, and strengthening, walking   4:53 PM, 11/08/22 Tereasa Coop, DPT Physical Therapy with [facility]

## 2022-11-20 ENCOUNTER — Encounter: Payer: Self-pay | Admitting: Physical Therapy

## 2022-11-20 ENCOUNTER — Ambulatory Visit: Payer: Medicare PPO | Admitting: Physical Therapy

## 2022-11-20 DIAGNOSIS — M6281 Muscle weakness (generalized): Secondary | ICD-10-CM | POA: Diagnosis not present

## 2022-11-20 DIAGNOSIS — R262 Difficulty in walking, not elsewhere classified: Secondary | ICD-10-CM

## 2022-11-20 DIAGNOSIS — Z96641 Presence of right artificial hip joint: Secondary | ICD-10-CM

## 2022-11-20 DIAGNOSIS — M79604 Pain in right leg: Secondary | ICD-10-CM

## 2022-11-20 NOTE — Therapy (Signed)
OUTPATIENT PHYSICAL THERAPY LOWER EXTREMITY TREATMENT  Patient Name: Danny Gross MRN: [id number] DOB:1934-04-05, 88 y.o., male Today's Date: 11/20/2022  END OF SESSION:  PT End of Session - 11/20/22 1348     Visit Number 13    Number of Visits 16    Date for PT Re-Evaluation 12/20/22    Authorization Type humana - auth 12 visits from 09/27/22 -12/08/22, 4 approved from 11/12 to 12/10    Authorization - Visit Number 1    Authorization - Number of Visits 4    PT Start Time 1348    PT Stop Time 1427    PT Time Calculation (min) 39 min    Activity Tolerance Patient limited by pain    Behavior During Therapy [facility] for tasks assessed/performed               Past Medical History:  Diagnosis Date   Arthritis    Cataract    Emphysema of lung (HCC)    Hypertension    Pneumonia 2015   Past Surgical History:  Procedure Laterality Date   HERNIA REPAIR  2003   TONSILLECTOMY  1939   TOTAL HIP ARTHROPLASTY Right 09/06/2022   Procedure: TOTAL HIP ARTHROPLASTY ANTERIOR APPROACH;  Surgeon: Tarry Kos, MD;  Location: [facility] OR;  Service: Orthopedics;  Laterality: Right;  3-C   VIDEO BRONCHOSCOPY Bilateral 08/11/2013   Procedure: VIDEO BRONCHOSCOPY WITH FLUORO;  Surgeon: Nyoka Cowden, MD;  Location: [facility];  Service: Cardiopulmonary;  Laterality: Bilateral;   Patient Active Problem List   Diagnosis Date Noted   Primary osteoarthritis of right hip 09/06/2022   Essential hypertension 08/14/2022   Pre-op examination 08/14/2022   Bilateral hip joint arthritis 03/13/2022   Myalgia 11/28/2021   Thoracic aortic aneurysm (HCC) 06/07/2021   Aortic atherosclerosis (HCC) 07/19/2020   Bronchiectasis without complication (HCC) 04/22/2018   Impaired fasting glucose 10/04/2014   Routine general medical examination at a health care facility 12/21/2013   Pulmonary infiltrates 07/13/2013    PCP: Hillard Danker, MD  REFERRING PROVIDER: Cristie Hem, PA-C  REFERRING DIAG:  [phone] (ICD-10-CM) - History of total hip replacement, right  THERAPY DIAG:  History of total hip replacement, right  Pain in right leg  Difficulty in walking, not elsewhere classified  Muscle weakness (generalized)  Rationale for Evaluation and Treatment: Rehabilitation  ONSET DATE: 09/06/22 DOS R THA   SUBJECTIVE:   SUBJECTIVE STATEMENT: 11/20/2022 States that he tried his balance exercises and  need help with straightening up and walking.  EVAL:States that he is not in pain since the surgery moving significantly better.  Reports getting in and out of the car is a lot easier compared to preoperatively.  Reports he had home health therapy and that helps a lot.  Reports that the PA removed his bandage and stitches last week.  Reports he wants to get back to normal and is frustrated with limitations at this time.  Follows up with MD in a few weeks.  No complaints just eager to get back to independence.  Reports sometimes he does not use the cane while at home.  Daughter present during today's evaluation.  PERTINENT HISTORY: Lung emphysema, AVN in R hips PAIN:  Are you having pain? Yes: NPRS scale: 0/10 Pain location: right lateral hip Pain description: sore Aggravating factors: pressure Relieving factors: rest  PRECAUTIONS: Anterior hip  RED FLAGS: None   WEIGHT BEARING RESTRICTIONS: Yes WBAT  FALLS:  Has patient fallen in last 6 months? No  LIVING ENVIRONMENT: Lives with: lives with their family Lives in: House/apartment Stairs: No Has following equipment at home: Single point cane  OCCUPATION: retired, active   PLOF: Independent  PATIENT GOALS: to be able to dance  NEXT MD VISIT: in 3 weeks  OBJECTIVE:     PATIENT SURVEYS:  FOTO 47%  COGNITION: Overall cognitive status: Within functional limits for tasks assessed     SENSATION: Not tested  EDEMA:  none    POSTURE: rounded shoulders, forward head, decreased lumbar lordosis, increased thoracic  kyphosis, and flexed trunk   PALPATION: Tenderness in right quad/thigh   LE Measurements Lower Extremity Right 10/29 Left 10/29   A/PROM MMT A/PROM MMT  Hip Flexion 100 3* WFL 4  Hip Extension      Hip Abduction      Hip Adduction      Hip Internal rotation 30     Hip External rotation 10     Knee Flexion 3+  4-   Knee Extension 3+  4   Ankle Dorsiflexion 4-  4+   Ankle Plantarflexion      Ankle Inversion      Ankle Eversion       (Blank rows = not tested) * uncomfortable    GAIT: Distance walked: 300 feet with cane wide BOS and limited hip extension SLS: 4  seconds on either leg no UE support 11/20/22  TODAY'S TREATMENT:                                                                                                                              DATE:   11/20/2022  Therapeutic Exercise: review of entire HEP Neuromuscular Re-education: SLS - max attempts B x10, narrow BOS on foam - x5 30" , on foam head turns x20 B, on head nods x20 B, foot taps x15 B,  tandem on foam - max attempts x5 B Manual Therapy:   Therapeutic Activity: Self Care: Trigger Point Dry Needling:  Modalities:    PATIENT EDUCATION:  Education details: on HEP, on current presentation, on POC  Person educated: Patient Education method: Explanation, Demonstration, and Handouts Education comprehension: verbalized understanding   HOME EXERCISE PROGRAM: [email or web address]  ASSESSMENT:  CLINICAL IMPRESSION: 11/20/2022 Session focused on review of entire HEP and answering all questions. Balance continues to be biggest challenge at this time, requiring min assist at time with balance activities on this date. Overall patient doing well but would continue to benefit from skilled PT to reduce fall risk.  EVAL:Patient is status post right hip replacement on September 06, 2002.  Overall patient is doing very well with significantly less pain postoperatively.  Patient does demonstrate limitations in overall range  of motion, strength and function.  Continues to move with wide base of support and hip and internal rotation which was how he was ambulating previously.  Educated patient and daughter in current presentation as well as plan moving forward and patient would greatly benefit  from skilled PT to improve overall physical presentation and return him to optimal function.  OBJECTIVE IMPAIRMENTS: Abnormal gait, decreased activity tolerance, decreased balance, decreased endurance, decreased knowledge of use of DME, decreased mobility, difficulty walking, decreased ROM, decreased strength, increased edema, improper body mechanics, postural dysfunction, and pain.   ACTIVITY LIMITATIONS: carrying, lifting, bending, sitting, standing, squatting, sleeping, stairs, transfers, bed mobility, bathing, dressing, and locomotion level  PARTICIPATION LIMITATIONS: cleaning, driving, community activity, and yard work  PERSONAL FACTORS: Age and 1 comorbidity: AVN in left hip  are also affecting patient's functional outcome.   REHAB POTENTIAL: Good  CLINICAL DECISION MAKING: Stable/uncomplicated  EVALUATION COMPLEXITY: Low   GOALS: Goals reviewed with patient? yes  SHORT TERM GOALS: Target date: 11/08/2022  Patient will be independent in self management strategies to improve quality of life and functional outcomes. Baseline: New Program Goal status: MET   2.  Patient will report at least 25 % improvement in overall symptoms and/or function to demonstrate improved functional mobility Baseline: 0% better Goal status: MET  3.  Patient will be able to ambulate for at least 25 minutes with limited gait deviations and without cane to improve community ambulation Baseline: Unable Goal status: MET  4. Patient will be able to stand on right leg for at least 30 seconds to demonstrate improved weightbearing tolerance   Baseline: Unable Goal status: PROGRESSING    LONG TERM GOALS: Target date: 12/20/2022   Patient  will report at least 50 % improvement in overall symptoms and/or function to demonstrate improved functional mobility Baseline: 0% better Goal status: MET  2.  Patient will improve score on FOTO outcomes measure to projected score to demonstrate overall improved function and QOL Baseline: see above Goal status: MET  3.  Patient will be able to stand up tall without hip flexion and right hip to demonstrate improved standing posture Baseline: Unable Goal status: MET       PLAN:  PT FREQUENCY:1- 2x/week for total of 16 visits  PT DURATION: 12 weeks  PLANNED INTERVENTIONS: Therapeutic exercises, Therapeutic activity, Neuromuscular re-education, Balance training, Gait training, Patient/Family education, Self Care, Joint mobilization, Joint manipulation, Stair training, Vestibular training, Canalith repositioning, Orthotic/Fit training, Prosthetic training, DME instructions, Aquatic Therapy, Dry Needling, Electrical stimulation, Spinal manipulation, Spinal mobilization, Cryotherapy, Moist heat, Taping, Traction, Ultrasound, Ionotophoresis 4mg /ml Dexamethasone, Manual therapy, and Re-evaluation.   PLAN FOR NEXT SESSION:  progress balance, hip ROM, and strengthening, walking   2:43 PM, 11/20/22 Tereasa Coop, DPT Physical Therapy with [facility]

## 2022-11-22 ENCOUNTER — Encounter: Payer: Self-pay | Admitting: Physical Therapy

## 2022-11-22 ENCOUNTER — Ambulatory Visit: Payer: Medicare PPO | Admitting: Physical Therapy

## 2022-11-22 DIAGNOSIS — M79604 Pain in right leg: Secondary | ICD-10-CM

## 2022-11-22 DIAGNOSIS — M6281 Muscle weakness (generalized): Secondary | ICD-10-CM

## 2022-11-22 DIAGNOSIS — Z96641 Presence of right artificial hip joint: Secondary | ICD-10-CM

## 2022-11-22 DIAGNOSIS — R262 Difficulty in walking, not elsewhere classified: Secondary | ICD-10-CM

## 2022-11-22 NOTE — Therapy (Addendum)
OUTPATIENT PHYSICAL THERAPY LOWER EXTREMITY TREATMENT PHYSICAL THERAPY DISCHARGE SUMMARY  Visits from Start of Care: 14  Current functional level related to goals / functional outcomes: See below   Remaining deficits: See below   Education / Equipment: See below   Patient agrees to discharge. Patient goals were partially met. Patient is being discharged due to being pleased with the current functional level.  1:26 PM, 12/12/22 Tereasa Coop, DPT Physical Therapy with [place]   Patient Name: Danny Gross MRN: [id number] DOB:12-24-1934, 88 y.o., male Today's Date: 11/22/2022  END OF SESSION:  PT End of Session - 11/22/22 1351     Visit Number 14    Number of Visits 16    Date for PT Re-Evaluation 12/20/22    Authorization Type humana - auth 12 visits from 09/27/22 -12/08/22, 4 approved from 11/12 to 12/10    Authorization - Visit Number 2    Authorization - Number of Visits 4    PT Start Time 1351   late to check in   PT Stop Time 1429    PT Time Calculation (min) 38 min    Activity Tolerance Patient limited by pain    Behavior During Therapy [facility] for tasks assessed/performed               Past Medical History:  Diagnosis Date   Arthritis    Cataract    Emphysema of lung (HCC)    Hypertension    Pneumonia 2015   Past Surgical History:  Procedure Laterality Date   HERNIA REPAIR  2003   TONSILLECTOMY  1939   TOTAL HIP ARTHROPLASTY Right 09/06/2022   Procedure: TOTAL HIP ARTHROPLASTY ANTERIOR APPROACH;  Surgeon: Tarry Kos, MD;  Location: [facility] OR;  Service: Orthopedics;  Laterality: Right;  3-C   VIDEO BRONCHOSCOPY Bilateral 08/11/2013   Procedure: VIDEO BRONCHOSCOPY WITH FLUORO;  Surgeon: Nyoka Cowden, MD;  Location: [facility];  Service: Cardiopulmonary;  Laterality: Bilateral;   Patient Active Problem List   Diagnosis Date Noted   Primary osteoarthritis of right hip 09/06/2022   Essential hypertension 08/14/2022   Pre-op examination  08/14/2022   Bilateral hip joint arthritis 03/13/2022   Myalgia 11/28/2021   Thoracic aortic aneurysm (HCC) 06/07/2021   Aortic atherosclerosis (HCC) 07/19/2020   Bronchiectasis without complication (HCC) 04/22/2018   Impaired fasting glucose 10/04/2014   Routine general medical examination at a health care facility 12/21/2013   Pulmonary infiltrates 07/13/2013    PCP: Hillard Danker, MD  REFERRING PROVIDER: Cristie Hem, PA-C  REFERRING DIAG: [phone] (ICD-10-CM) - History of total hip replacement, right  THERAPY DIAG:  History of total hip replacement, right  Pain in right leg  Difficulty in walking, not elsewhere classified  Muscle weakness (generalized)  Rationale for Evaluation and Treatment: Rehabilitation  ONSET DATE: 09/06/22 DOS R THA   SUBJECTIVE:   SUBJECTIVE STATEMENT: 11/22/2022 States that his balance s still challenging. States that he ordered an introduction to tai chi.   EVAL:States that he is not in pain since the surgery moving significantly better.  Reports getting in and out of the car is a lot easier compared to preoperatively.  Reports he had home health therapy and that helps a lot.  Reports that the PA removed his bandage and stitches last week.  Reports he wants to get back to normal and is frustrated with limitations at this time.  Follows up with MD in a few weeks.  No complaints just eager to get back to independence.  Reports sometimes he does not use the cane while at home.  Daughter present during today's evaluation.  PERTINENT HISTORY: Lung emphysema, AVN in R hips PAIN:  Are you having pain? Yes: NPRS scale: 0/10 Pain location: right lateral hip Pain description: sore Aggravating factors: pressure Relieving factors: rest  PRECAUTIONS: Anterior hip  RED FLAGS: None   WEIGHT BEARING RESTRICTIONS: Yes WBAT  FALLS:  Has patient fallen in last 6 months? No  LIVING ENVIRONMENT: Lives with: lives with their family Lives  in: House/apartment Stairs: No Has following equipment at home: Single point cane  OCCUPATION: retired, active   PLOF: Independent  PATIENT GOALS: to be able to dance  NEXT MD VISIT: in 3 weeks  OBJECTIVE:     PATIENT SURVEYS:  FOTO 47%  COGNITION: Overall cognitive status: Within functional limits for tasks assessed     SENSATION: Not tested  EDEMA:  none    POSTURE: rounded shoulders, forward head, decreased lumbar lordosis, increased thoracic kyphosis, and flexed trunk   PALPATION: Tenderness in right quad/thigh   LE Measurements Lower Extremity Right 10/29 Left 10/29   A/PROM MMT A/PROM MMT  Hip Flexion 100 3* WFL 4  Hip Extension      Hip Abduction      Hip Adduction      Hip Internal rotation 30     Hip External rotation 10     Knee Flexion 3+  4-   Knee Extension 3+  4   Ankle Dorsiflexion 4-  4+   Ankle Plantarflexion      Ankle Inversion      Ankle Eversion       (Blank rows = not tested) * uncomfortable    GAIT: Distance walked: 300 feet with cane wide BOS and limited hip extension SLS: 4  seconds on either leg no UE support 11/20/22  TODAY'S TREATMENT:                                                                                                                              DATE:   11/22/2022  Therapeutic Exercise: review of entire HEP Neuromuscular Re-education: cone step overs slow and controlled with UE assist then no UE assist forward, side step overs, foot taps on cone with 2 finger support - B, staggered steps on bosu lateral and forward - 35 minutes total   Manual Therapy:   Therapeutic Activity: Self Care: Trigger Point Dry Needling:  Modalities:    PATIENT EDUCATION:  Education details: on HEP, on current presentation, on POC  Person educated: Patient Education method: Explanation, Demonstration, and Handouts Education comprehension: verbalized understanding   HOME EXERCISE  PROGRAM: [email or web address]  ASSESSMENT:  CLINICAL IMPRESSION: 11/22/2022 Continued to focus on progressing exercises. Tolerated well. SBA with all interventions. Faituge but no pain noted. Occasional loss of balance but correceted with min assist. Instructed paitnt to not perform new exercises for HEP secondary to needing assist from PT to limit loss of  balance. Will continue with current POC as tolerated.   EVAL:Patient is status post right hip replacement on September 06, 2002.  Overall patient is doing very well with significantly less pain postoperatively.  Patient does demonstrate limitations in overall range of motion, strength and function.  Continues to move with wide base of support and hip and internal rotation which was how he was ambulating previously.  Educated patient and daughter in current presentation as well as plan moving forward and patient would greatly benefit from skilled PT to improve overall physical presentation and return him to optimal function.  OBJECTIVE IMPAIRMENTS: Abnormal gait, decreased activity tolerance, decreased balance, decreased endurance, decreased knowledge of use of DME, decreased mobility, difficulty walking, decreased ROM, decreased strength, increased edema, improper body mechanics, postural dysfunction, and pain.   ACTIVITY LIMITATIONS: carrying, lifting, bending, sitting, standing, squatting, sleeping, stairs, transfers, bed mobility, bathing, dressing, and locomotion level  PARTICIPATION LIMITATIONS: cleaning, driving, community activity, and yard work  PERSONAL FACTORS: Age and 1 comorbidity: AVN in left hip  are also affecting patient's functional outcome.   REHAB POTENTIAL: Good  CLINICAL DECISION MAKING: Stable/uncomplicated  EVALUATION COMPLEXITY: Low   GOALS: Goals reviewed with patient? yes  SHORT TERM GOALS: Target date: 11/08/2022  Patient will be independent in self management strategies to improve quality of life and functional  outcomes. Baseline: New Program Goal status: MET   2.  Patient will report at least 25 % improvement in overall symptoms and/or function to demonstrate improved functional mobility Baseline: 0% better Goal status: MET  3.  Patient will be able to ambulate for at least 25 minutes with limited gait deviations and without cane to improve community ambulation Baseline: Unable Goal status: MET  4. Patient will be able to stand on right leg for at least 30 seconds to demonstrate improved weightbearing tolerance   Baseline: Unable Goal status: PROGRESSING    LONG TERM GOALS: Target date: 12/20/2022   Patient will report at least 50 % improvement in overall symptoms and/or function to demonstrate improved functional mobility Baseline: 0% better Goal status: MET  2.  Patient will improve score on FOTO outcomes measure to projected score to demonstrate overall improved function and QOL Baseline: see above Goal status: MET  3.  Patient will be able to stand up tall without hip flexion and right hip to demonstrate improved standing posture Baseline: Unable Goal status: MET       PLAN:  PT FREQUENCY:1- 2x/week for total of 16 visits  PT DURATION: 12 weeks  PLANNED INTERVENTIONS: Therapeutic exercises, Therapeutic activity, Neuromuscular re-education, Balance training, Gait training, Patient/Family education, Self Care, Joint mobilization, Joint manipulation, Stair training, Vestibular training, Canalith repositioning, Orthotic/Fit training, Prosthetic training, DME instructions, Aquatic Therapy, Dry Needling, Electrical stimulation, Spinal manipulation, Spinal mobilization, Cryotherapy, Moist heat, Taping, Traction, Ultrasound, Ionotophoresis 4mg /ml Dexamethasone, Manual therapy, and Re-evaluation.   PLAN FOR NEXT SESSION:  progress balance, hip ROM, and strengthening, walking   2:37 PM, 11/22/22 Tereasa Coop, DPT Physical Therapy with [facility]

## 2022-11-24 ENCOUNTER — Encounter (HOSPITAL_BASED_OUTPATIENT_CLINIC_OR_DEPARTMENT_OTHER): Payer: Self-pay

## 2022-11-24 ENCOUNTER — Emergency Department (HOSPITAL_BASED_OUTPATIENT_CLINIC_OR_DEPARTMENT_OTHER)
Admission: EM | Admit: 2022-11-24 | Discharge: 2022-11-24 | Disposition: A | Discharge status: Home / Self Care | Payer: Medicare PPO | Attending: Emergency Medicine | Admitting: Emergency Medicine

## 2022-11-24 ENCOUNTER — Other Ambulatory Visit: Payer: Self-pay

## 2022-11-24 DIAGNOSIS — Z79899 Other long term (current) drug therapy: Secondary | ICD-10-CM | POA: Diagnosis not present

## 2022-11-24 DIAGNOSIS — I10 Essential (primary) hypertension: Secondary | ICD-10-CM | POA: Diagnosis not present

## 2022-11-24 DIAGNOSIS — L989 Disorder of the skin and subcutaneous tissue, unspecified: Secondary | ICD-10-CM | POA: Diagnosis not present

## 2022-11-24 DIAGNOSIS — Z7982 Long term (current) use of aspirin: Secondary | ICD-10-CM | POA: Insufficient documentation

## 2022-11-24 DIAGNOSIS — L089 Local infection of the skin and subcutaneous tissue, unspecified: Secondary | ICD-10-CM | POA: Diagnosis not present

## 2022-11-24 NOTE — Discharge Instructions (Addendum)
You have been seen today for your complaint of skin lesion. Home care instructions are as follows:  Keep the wound covered Follow up with: Your dermatologist as scheduled.  You will likely need a biopsy Please seek immediate medical care if you develop any of the following symptoms: Any new or worsening symptoms At this time there does not appear to be the presence of an emergent medical condition, however there is always the potential for conditions to change. Please read and follow the below instructions.  Do not take your medicine if  develop an itchy rash, swelling in your mouth or lips, or difficulty breathing; call 911 and seek immediate emergency medical attention if this occurs.  You may review your lab tests and imaging results in their entirety on your MyChart account.  Please discuss all results of fully with your primary care provider and other specialist at your follow-up visit.  Note: Portions of this text may have been transcribed using voice recognition software. Every effort was made to ensure accuracy; however, inadvertent computerized transcription errors may still be present.

## 2022-11-24 NOTE — ED Triage Notes (Signed)
He states he noticed a "red bump" nesr his lat. Left shoulder ~ 2 days ago. He denies fever, and cannot recall any trauma to the area.

## 2022-11-24 NOTE — ED Notes (Signed)
Patient walked out after MD stated they were D/C'd.  Left without paperwork

## 2022-11-24 NOTE — ED Provider Notes (Signed)
[facility] AT [facility] Provider Note   CSN: [id number] Arrival date & time: 11/24/22  1457     History  Chief Complaint  Patient presents with   Skin lesion    Danny Gross is a 88 y.o. male.  With history of hypertension, arthritis, emphysema presenting to the ED for evaluation of a skin lesion.  This is localized to the left posterior of the shoulder.  He noticed this last night because it started to bleed.  He denies any pain to the area.  He denies any purulent drainage.  He has an appointment with his dermatologist on Thursday of next week.  HPI     Home Medications Prior to Admission medications   Medication Sig Start Date End Date Taking? Authorizing Provider  amLODipine (NORVASC) 2.5 MG tablet Take 1 tablet (2.5 mg total) by mouth daily. 08/14/22   Myrlene Broker, MD  amoxicillin (AMOXIL) 500 MG capsule Take 4 pills one hour prior to dental work Patient not taking: Reported on 10/22/2022 10/19/22   Cristie Hem, PA-C  aspirin EC 81 MG tablet Take 1 tablet (81 mg total) by mouth 2 (two) times daily. To be taken after surgery to prevent blood clots 09/03/22 09/03/23  Cristie Hem, PA-C  brimonidine-timolol (COMBIGAN) 0.2-0.5 % ophthalmic solution Place 1 drop into both eyes every 12 (twelve) hours.    [provider]  dorzolamide (TRUSOPT) 2 % ophthalmic solution Place 1 drop into both eyes 2 (two) times daily.    [provider]  guaiFENesin (MUCINEX) 600 MG 12 hr tablet Take 600 mg by mouth 2 (two) times daily.    [provider]  ROCKLATAN 0.02-0.005 % SOLN Place 1 drop into both eyes at bedtime. 12/05/20   [provider]      Allergies    Patient has no known allergies.    Review of Systems   Review of Systems  Skin:  Positive for wound.  All other systems reviewed and are negative.   Physical Exam Updated Vital Signs BP (!) 153/79 (BP Location: Right Arm)   Pulse 61   Temp  98.1 F (36.7 C)   Resp 15   SpO2 96%  Physical Exam Vitals and nursing note reviewed.  Constitutional:      General: He is not in acute distress.    Appearance: Normal appearance. He is normal weight. He is not ill-appearing.  HENT:     Head: Normocephalic and atraumatic.  Pulmonary:     Effort: Pulmonary effort is normal. No respiratory distress.  Abdominal:     General: Abdomen is flat.  Musculoskeletal:        General: Normal range of motion.     Cervical back: Neck supple.  Skin:    General: Skin is warm and dry.     Comments: 2 cm x 2 cm raised lesion to the left upper back.  This appears to be partially avulsed from the skin with sequela of recent bleeding.  No active bleeding.  No purulent drainage.  No surrounding erythema or induration.  Neurological:     Mental Status: He is alert and oriented to person, place, and time.  Psychiatric:        Mood and Affect: Mood normal.        Behavior: Behavior normal.     ED Results / Procedures / Treatments   Labs (all labs ordered are listed, but only abnormal results are displayed) Labs Reviewed - No  data to display  EKG None  Radiology No results found.  Procedures Procedures    Medications Ordered in ED Medications - No data to display  ED Course/ Medical Decision Making/ A&P                                 Medical Decision Making This patient presents to the ED for concern of skin lesion, this involves an extensive number of treatment options, and is a complaint that carries with it a high risk of complications and morbidity.  The differential diagnosis includes AK, SCC, BCC, abscess  Afebrile, hemodynamically stable.  88 year old male presenting for evaluation of a lesion to the left upper back.  He noticed this last night.  Unsure how long he has had this.  He states the lesion was bleeding yesterday.  No purulent drainage.  No other complaints.  He does have a follow-up appointment with his dermatologist on  Thursday of next week.  On exam, there is a 2 cm x 2 cm raised lesion.  This is suspicious for BCC versus SCC.  Patient would benefit from biopsy with a dermatologist.  Discussed this plan with the patient.  He is in agreement.  Stable at discharge.  At this time there does not appear to be any evidence of an acute emergency medical condition and the patient appears stable for discharge with appropriate outpatient follow up. Diagnosis was discussed with patient who verbalizes understanding of care plan and is agreeable to discharge. I have discussed return precautions with patient who verbalizes understanding. Patient encouraged to follow-up with their PCP within 1 week. All questions answered.  Patient's case discussed with Dr. Karene Fry who agrees with plan to discharge with follow-up.   Note: Portions of this report may have been transcribed using voice recognition software. Every effort was made to ensure accuracy; however, inadvertent computerized transcription errors may still be present.        Final Clinical Impression(s) / ED Diagnoses Final diagnoses:  Skin lesion of back    Rx / DC Orders ED Discharge Orders     None         Michelle Piper, Cordelia Poche 11/24/22 1613    Ernie Avena, MD 11/24/22 2036

## 2022-11-27 ENCOUNTER — Ambulatory Visit (INDEPENDENT_AMBULATORY_CARE_PROVIDER_SITE_OTHER): Payer: Medicare PPO | Admitting: Physician Assistant

## 2022-11-27 DIAGNOSIS — Z96641 Presence of right artificial hip joint: Secondary | ICD-10-CM

## 2022-11-27 NOTE — Progress Notes (Signed)
   Post-Op Visit Note   Patient: Danny Gross           Date of Birth: [birth year]           MRN: [id number] Visit Date: 11/27/2022 PCP: Myrlene Broker, MD   Assessment & Plan:  Chief Complaint:  Chief Complaint  Patient presents with   Right Hip - Follow-up    09/06/22 RT THA   Visit Diagnoses:  1. History of total hip replacement, right     Plan: Patient is a pleasant 88 year old gentleman who comes in today nearly 3 months status post right total hip replacement 09/06/2022.  He has been doing great.  No complaints.  Right hip exam reveals painless hip flexion and logroll.  He is neurovascularly intact distally.  At this point, he will continue to advance with activity as tolerated.  Dental prophylaxis reinforced.  Follow-up in 3 months for repeat evaluation and AP pelvis x-rays.  Call with concerns or questions.  Follow-Up Instructions: Return in about 3 months (around 02/27/2023).   Orders:  No orders of the defined types were placed in this encounter.  No orders of the defined types were placed in this encounter.   Imaging: No new imaging  PMFS History: Patient Active Problem List   Diagnosis Date Noted   Primary osteoarthritis of right hip 09/06/2022   Essential hypertension 08/14/2022   Pre-op examination 08/14/2022   Bilateral hip joint arthritis 03/13/2022   Myalgia 11/28/2021   Thoracic aortic aneurysm (HCC) 06/07/2021   Aortic atherosclerosis (HCC) 07/19/2020   Bronchiectasis without complication (HCC) 04/22/2018   Impaired fasting glucose 10/04/2014   Routine general medical examination at a health care facility 12/21/2013   Pulmonary infiltrates 07/13/2013   Past Medical History:  Diagnosis Date   Arthritis    Cataract    Emphysema of lung (HCC)    Hypertension    Pneumonia 2015    Family History  Problem Relation Age of Onset   Cancer Maternal Aunt        ? type     Past Surgical History:  Procedure Laterality Date   HERNIA REPAIR   2003   TONSILLECTOMY  1939   TOTAL HIP ARTHROPLASTY Right 09/06/2022   Procedure: TOTAL HIP ARTHROPLASTY ANTERIOR APPROACH;  Surgeon: Tarry Kos, MD;  Location: [facility] OR;  Service: Orthopedics;  Laterality: Right;  3-C   VIDEO BRONCHOSCOPY Bilateral 08/11/2013   Procedure: VIDEO BRONCHOSCOPY WITH FLUORO;  Surgeon: Nyoka Cowden, MD;  Location: [facility];  Service: Cardiopulmonary;  Laterality: Bilateral;   Social History   Occupational History   Occupation: Retired  Tobacco Use   Smoking status: Never   Smokeless tobacco: Never  Vaping Use   Vaping status: Never Used  Substance and Sexual Activity   Alcohol use: Yes    Alcohol/week: 8.0 standard drinks of alcohol    Types: 1 Cans of beer, 7 Standard drinks or equivalent per week    Comment: cocktail daily   Drug use: No   Sexual activity: Not Currently

## 2022-12-04 ENCOUNTER — Encounter: Payer: Self-pay | Admitting: Internal Medicine

## 2022-12-13 ENCOUNTER — Telehealth: Payer: Self-pay

## 2022-12-13 DIAGNOSIS — L812 Freckles: Secondary | ICD-10-CM | POA: Diagnosis not present

## 2022-12-13 DIAGNOSIS — L570 Actinic keratosis: Secondary | ICD-10-CM | POA: Diagnosis not present

## 2022-12-13 DIAGNOSIS — L565 Disseminated superficial actinic porokeratosis (DSAP): Secondary | ICD-10-CM | POA: Diagnosis not present

## 2022-12-13 DIAGNOSIS — L820 Inflamed seborrheic keratosis: Secondary | ICD-10-CM | POA: Diagnosis not present

## 2022-12-13 DIAGNOSIS — L821 Other seborrheic keratosis: Secondary | ICD-10-CM | POA: Diagnosis not present

## 2022-12-13 DIAGNOSIS — Z85828 Personal history of other malignant neoplasm of skin: Secondary | ICD-10-CM | POA: Diagnosis not present

## 2022-12-13 DIAGNOSIS — C44629 Squamous cell carcinoma of skin of left upper limb, including shoulder: Secondary | ICD-10-CM | POA: Diagnosis not present

## 2022-12-13 DIAGNOSIS — C44529 Squamous cell carcinoma of skin of other part of trunk: Secondary | ICD-10-CM | POA: Diagnosis not present

## 2022-12-13 DIAGNOSIS — D485 Neoplasm of uncertain behavior of skin: Secondary | ICD-10-CM | POA: Diagnosis not present

## 2022-12-13 NOTE — Telephone Encounter (Signed)
Transition Care Management Unsuccessful Follow-up Telephone Call  Date of discharge and from where:  11/24/2022 [facility]  Attempts:  1st Attempt  Reason for unsuccessful TCM follow-up call:  Left voice message  Brigett Estell [facility]  [facility], [facility] Guide Direct Dial: [phone]  Website: [email or web address]

## 2022-12-14 ENCOUNTER — Telehealth: Payer: Self-pay

## 2022-12-14 NOTE — Telephone Encounter (Signed)
Transition Care Management Follow-up Telephone Call Date of discharge and from where: 11/24/2022 [facility] How have you been since you were released from the hospital? Patient stated he is feeling much better. Any questions or concerns? No  Items Reviewed: Did the pt receive and understand the discharge instructions provided? Yes  Medications obtained and verified?  No medication prescribed this visit. Other? No  Any new allergies since your discharge? No  Dietary orders reviewed? Yes Do you have support at home? Yes   Follow up appointments reviewed:  PCP Hospital f/u appt confirmed? No  Scheduled to see  on  @ . Specialist Hospital f/u appt confirmed? No  Scheduled to see  on  @ . Are transportation arrangements needed? No  If their condition worsens, is the pt aware to call PCP or go to the Emergency Dept.? Yes Was the patient provided with contact information for the PCP's office or ED? Yes Was to pt encouraged to call back with questions or concerns? Yes   Lashunta Frieden [facility]  [facility], [facility] Guide Direct Dial: [phone]  Website: [email or web address]

## 2023-02-20 DIAGNOSIS — H0102A Squamous blepharitis right eye, upper and lower eyelids: Secondary | ICD-10-CM | POA: Diagnosis not present

## 2023-02-20 DIAGNOSIS — Z961 Presence of intraocular lens: Secondary | ICD-10-CM | POA: Diagnosis not present

## 2023-02-20 DIAGNOSIS — H43812 Vitreous degeneration, left eye: Secondary | ICD-10-CM | POA: Diagnosis not present

## 2023-02-20 DIAGNOSIS — H401233 Low-tension glaucoma, bilateral, severe stage: Secondary | ICD-10-CM | POA: Diagnosis not present

## 2023-02-20 DIAGNOSIS — H0102B Squamous blepharitis left eye, upper and lower eyelids: Secondary | ICD-10-CM | POA: Diagnosis not present

## 2023-02-26 ENCOUNTER — Other Ambulatory Visit (INDEPENDENT_AMBULATORY_CARE_PROVIDER_SITE_OTHER): Payer: Medicare PPO

## 2023-02-26 ENCOUNTER — Ambulatory Visit: Payer: Medicare PPO | Admitting: Orthopaedic Surgery

## 2023-02-26 DIAGNOSIS — Z96641 Presence of right artificial hip joint: Secondary | ICD-10-CM

## 2023-02-26 NOTE — Progress Notes (Signed)
   Post-Op Visit Note   Patient: Danny Gross           Date of Birth: January 25, 1934           MRN: [id number] Visit Date: 02/26/2023 PCP: Myrlene Broker, MD   Assessment & Plan:  Chief Complaint:  Chief Complaint  Patient presents with   Right Hip - Follow-up   Visit Diagnoses:  1. History of right hip replacement     Plan: Danny Gross is 6 months postop from a right total hip arthroplasty.  He is doing well has no complaints.  Examination shows a fully healed surgical scar.  Normal gait and fluid painless range of motion.  Implant looks good on x-rays.  Dental prophylaxis reinforced.  Recheck in 6 months with repeat radiographs.  Activity as tolerated.  Follow-Up Instructions: Return in about 6 months (around 08/26/2023).   Orders:  Orders Placed This Encounter  Procedures   XR Pelvis 1-2 Views   No orders of the defined types were placed in this encounter.   Imaging: XR Pelvis 1-2 Views Result Date: 02/26/2023 X-rays of the right hip show stable right total hip arthroplasty without complications.   PMFS History: Patient Active Problem List   Diagnosis Date Noted   Primary osteoarthritis of right hip 09/06/2022   Essential hypertension 08/14/2022   Pre-op examination 08/14/2022   Bilateral hip joint arthritis 03/13/2022   Myalgia 11/28/2021   Thoracic aortic aneurysm (HCC) 06/07/2021   Aortic atherosclerosis (HCC) 07/19/2020   Bronchiectasis without complication (HCC) 04/22/2018   Impaired fasting glucose 10/04/2014   Routine general medical examination at a health care facility 12/21/2013   Pulmonary infiltrates 07/13/2013   Past Medical History:  Diagnosis Date   Arthritis    Cataract    Emphysema of lung (HCC)    Hypertension    Pneumonia 2015    Family History  Problem Relation Age of Onset   Cancer Maternal Aunt        ? type     Past Surgical History:  Procedure Laterality Date   HERNIA REPAIR  2003   TONSILLECTOMY  1939   TOTAL HIP  ARTHROPLASTY Right 09/06/2022   Procedure: TOTAL HIP ARTHROPLASTY ANTERIOR APPROACH;  Surgeon: Tarry Kos, MD;  Location: [facility] OR;  Service: Orthopedics;  Laterality: Right;  3-C   VIDEO BRONCHOSCOPY Bilateral 08/11/2013   Procedure: VIDEO BRONCHOSCOPY WITH FLUORO;  Surgeon: Nyoka Cowden, MD;  Location: [facility];  Service: Cardiopulmonary;  Laterality: Bilateral;   Social History   Occupational History   Occupation: Retired  Tobacco Use   Smoking status: Never   Smokeless tobacco: Never  Vaping Use   Vaping status: Never Used  Substance and Sexual Activity   Alcohol use: Yes    Alcohol/week: 8.0 standard drinks of alcohol    Types: 1 Cans of beer, 7 Standard drinks or equivalent per week    Comment: cocktail daily   Drug use: No   Sexual activity: Not Currently

## 2023-03-14 DIAGNOSIS — Z0184 Encounter for antibody response examination: Secondary | ICD-10-CM | POA: Diagnosis not present

## 2023-03-19 ENCOUNTER — Ambulatory Visit (INDEPENDENT_AMBULATORY_CARE_PROVIDER_SITE_OTHER): Payer: Medicare PPO

## 2023-03-19 VITALS — BP 142/72 | HR 60 | Ht 66.0 in | Wt 154.8 lb

## 2023-03-19 DIAGNOSIS — Z0000 Encounter for general adult medical examination without abnormal findings: Secondary | ICD-10-CM | POA: Diagnosis not present

## 2023-03-19 NOTE — Progress Notes (Signed)
 Subjective:   Danny Gross is a 89 y.o. who presents for a Medicare Wellness preventive visit.  Visit Complete: In person  AWV Questionnaire: No: Patient Medicare AWV questionnaire was not completed prior to this visit.  Cardiac Risk Factors include: advanced age (>81men, >25 women);hypertension;male gender     Objective:    Today's Vitals   03/19/23 1055  BP: (!) 142/72  Pulse: 60  Weight: 154 lb 12.8 oz (70.2 kg)  Height: 5\' 6"  (1.676 m)   Body mass index is 24.99 kg/m.     03/19/2023   10:54 AM 11/24/2022    3:13 PM 09/06/2022    6:09 AM 08/22/2022   11:29 AM 04/16/2022   10:43 AM 03/21/2022   10:27 AM 04/13/2021   11:16 AM  Advanced Directives  Does Patient Have a Medical Advance Directive? Yes No Yes Yes Yes Yes Yes  Type of Estate agent of [place];Living will  Healthcare Power of [place];Living will Healthcare Power of [place];Living will Living will;Healthcare Power of [employer] Power of [place];Living will  Does patient want to make changes to medical advance directive?    No - Patient declined  No - Patient declined   Copy of Healthcare Power of Attorney in Chart? No - copy requested  No - copy requested No - copy requested No - copy requested    Would patient like information on creating a medical advance directive?  No - Patient declined   No - Patient declined      Current Medications (verified) Outpatient Encounter Medications as of 03/19/2023  Medication Sig   amLODipine (NORVASC) 2.5 MG tablet Take 1 tablet (2.5 mg total) by mouth daily.   brimonidine-timolol (COMBIGAN) 0.2-0.5 % ophthalmic solution Place 1 drop into both eyes every 12 (twelve) hours.   COMIRNATY syringe Inject 0.3 mLs into the muscle once.   dorzolamide (TRUSOPT) 2 % ophthalmic solution Place 1 drop into both eyes 2 (two) times daily.   FLUZONE HIGH-DOSE 0.5 ML injection Inject 0.5 mLs into the muscle once.   guaiFENesin (MUCINEX) 600 MG 12 hr tablet  Take 600 mg by mouth 2 (two) times daily.   PREVNAR 20 0.5 ML injection Inject 0.5 mLs into the muscle once.   ROCKLATAN 0.02-0.005 % SOLN Place 1 drop into both eyes at bedtime.   amoxicillin (AMOXIL) 500 MG capsule Take 4 pills one hour prior to dental work   aspirin EC 81 MG tablet Take 1 tablet (81 mg total) by mouth 2 (two) times daily. To be taken after surgery to prevent blood clots   No facility-administered encounter medications on file as of 03/19/2023.    Allergies (verified) Patient has no known allergies.   History: Past Medical History:  Diagnosis Date   Arthritis    Cataract    Emphysema of lung (HCC)    Hypertension    Pneumonia 2015   Past Surgical History:  Procedure Laterality Date   HERNIA REPAIR  2003   TONSILLECTOMY  1939   TOTAL HIP ARTHROPLASTY Right 09/06/2022   Procedure: TOTAL HIP ARTHROPLASTY ANTERIOR APPROACH;  Surgeon: Tarry Kos, MD;  Location: [facility] OR;  Service: Orthopedics;  Laterality: Right;  3-C   VIDEO BRONCHOSCOPY Bilateral 08/11/2013   Procedure: VIDEO BRONCHOSCOPY WITH FLUORO;  Surgeon: Nyoka Cowden, MD;  Location: [facility];  Service: Cardiopulmonary;  Laterality: Bilateral;   Family History  Problem Relation Age of Onset   Cancer Maternal Aunt        ?  type    Social History   Socioeconomic History   Marital status: Married    Spouse name: Not on file   Number of children: 2   Years of education: Not on file   Highest education level: Doctorate  Occupational History   Occupation: Retired  Tobacco Use   Smoking status: Never    Passive exposure: Never   Smokeless tobacco: Never  Vaping Use   Vaping status: Never Used  Substance and Sexual Activity   Alcohol use: Yes    Alcohol/week: 8.0 standard drinks of alcohol    Types: 1 Cans of beer, 7 Standard drinks or equivalent per week    Comment: cocktail daily   Drug use: No   Sexual activity: Not Currently  Other Topics Concern   Not on file  Social History Narrative    Not on file   Social Drivers of Health   Financial Resource Strain: Low Risk  (03/19/2023)   Overall Financial Resource Strain (CARDIA)    Difficulty of Paying Living Expenses: Not hard at all  Food Insecurity: No Food Insecurity (03/19/2023)   Hunger Vital Sign    Worried About Running Out of Food in the Last Year: Never true    Ran Out of Food in the Last Year: Never true  Transportation Needs: No Transportation Needs (03/19/2023)   PRAPARE - Administrator, Civil Service (Medical): No    Lack of Transportation (Non-Medical): No  Physical Activity: Insufficiently Active (03/19/2023)   Exercise Vital Sign    Days of Exercise per Week: 7 days    Minutes of Exercise per Session: 20 min  Stress: No Stress Concern Present (03/19/2023)   [employer] of Occupational Health - Occupational Stress Questionnaire    Feeling of Stress : Not at all  Social Connections: Socially Integrated (03/19/2023)   Social Connection and Isolation Panel [NHANES]    Frequency of Communication with Friends and Family: More than three times a week    Frequency of Social Gatherings with Friends and Family: More than three times a week    Attends Religious Services: More than 4 times per year    Active Member of [employer] or Organizations: Yes    Attends Engineer, structural: More than 4 times per year    Marital Status: Married    Tobacco Counseling - Non Smoker Counseling given - N/A   Clinical Intake:  Pre-visit preparation completed: Yes  Pain : No/denies pain     BMI - recorded: 24.99 Nutritional Status: BMI of 19-24  Normal Nutritional Risks: None Diabetes: No  How often do you need to have someone help you when you read instructions, pamphlets, or other written materials from your doctor or pharmacy?: 1 - Never  Interpreter Needed?: No  Information entered by :: Hassell Halim, CMA   Activities of Daily Living     03/19/2023   10:57 AM 09/06/2022    2:00 PM  In  your present state of health, do you have any difficulty performing the following activities:  Hearing? 0 1  Comment  no aids  Vision? 0 1  Comment  Blind in right eye  Difficulty concentrating or making decisions? 0 0  Walking or climbing stairs? 0 1  Comment  pain in right hip  Dressing or bathing? 0 0  Doing errands, shopping? 0   Preparing Food and eating ? N   Using the Toilet? N   In the past six months, have you accidently leaked  urine? N   Do you have problems with loss of bowel control? N   Managing your Medications? N   Managing your Finances? N   Housekeeping or managing your Housekeeping? N     Patient Care Team: Myrlene Broker, MD as PCP - General (Internal Medicine) Sallye Lat, MD as Consulting Physician (Ophthalmology)  Indicate any recent Medical Services you may have received from other than [facility] providers in the past year (date may be approximate).     Assessment:   This is a routine wellness examination for Lorrin.  Hearing/Vision screen Hearing Screening - Comments:: Denies hearing difficulties   Vision Screening - Comments:: Wears rx glasses - up to date with routine eye exams with Dr Ruffin Pyo   Goals Addressed               This Visit's Progress     Patient Stated (pt-stated)        Patient stated he plans to have left hip (surgery) done and therapy.       Depression Screen     03/19/2023   10:59 AM 10/22/2022    3:32 PM 03/08/2022   10:33 AM 01/25/2022    4:15 PM 11/28/2021    2:51 PM 06/06/2021    3:04 PM 12/14/2020    4:10 PM  PHQ 2/9 Scores  PHQ - 2 Score 0 0 0 0 0 0 0  PHQ- 9 Score 0  0  0 0     Fall Risk     03/19/2023   11:01 AM 10/22/2022    3:31 PM 07/15/2022    5:03 PM 07/10/2022    5:34 PM 03/08/2022   10:31 AM  Fall Risk   Falls in the past year? 0 0 0 0 0  Number falls in past yr: 0 0 0 0 0  Injury with Fall? 0 0 0 0 0  Risk for fall due to : No Fall Risks No Fall Risks     Follow up Falls prevention  discussed;Falls evaluation completed Falls evaluation completed   Falls evaluation completed    MEDICARE RISK AT HOME:  Medicare Risk at Home Any stairs in or around the home?: No (for attic only) If so, are there any without handrails?: No Home free of loose throw rugs in walkways, pet beds, electrical cords, etc?: Yes Adequate lighting in your home to reduce risk of falls?: Yes Life alert?: No Use of a cane, walker or w/c?: No Grab bars in the bathroom?: Yes Shower chair or bench in shower?: Yes Elevated toilet seat or a handicapped toilet?: Yes  TIMED UP AND GO:  Was the test performed?  No  Cognitive Function: 6CIT completed    09/25/2017    3:35 PM  MMSE - Mini Mental State Exam  Orientation to time 5  Orientation to Place 5  Registration 3  Attention/ Calculation 5  Recall 2  Language- name 2 objects 2  Language- repeat 1  Language- follow 3 step command 3  Language- read & follow direction 1  Write a sentence 1  Copy design 1  Total score 29        03/19/2023   11:02 AM  6CIT Screen  What Year? 0 points  What month? 0 points  What time? 0 points  Count back from 20 0 points  Months in reverse 0 points  Repeat phrase 2 points  Total Score 2 points    Immunizations Immunization History  Administered Date(s) Administered  Fluad Quad(high Dose 65+) 10/23/2018, 11/22/2020   Influenza Split 10/08/2012   Influenza, High Dose Seasonal PF 10/04/2014   Influenza,inj,Quad PF,6+ Mos 09/18/2013   Influenza-Unspecified 10/22/2015, 10/12/2016, 10/15/2017, 10/25/2021, 12/09/2022   PFIZER Comirnaty(Gray Top)Covid-19 Tri-Sucrose Vaccine 03/01/2020, 07/28/2020   PFIZER(Purple Top)SARS-COV-2 Vaccination 02/02/2019, 02/20/2019   Pfizer Covid-19 Vaccine Bivalent Booster 94yrs & up 12/06/2020, 05/16/2021   Pneumococcal Conjugate-13 07/27/2013   Pneumococcal Polysaccharide-23 10/04/2014   Rsv, Bivalent, Protein Subunit Rsvpref,pf Verdis Frederickson) 08/24/2021   Td 04/13/2021    Tdap 01/08/2010   Zoster Recombinant(Shingrix) 06/19/2016, 11/02/2016   Zoster, Live 01/08/2006    Screening Tests Health Maintenance  Topic Date Due   COVID-19 Vaccine (7 - 2024-25 season) 09/09/2022   Medicare Annual Wellness (AWV)  03/18/2024   DTaP/Tdap/Td (3 - Td or Tdap) 04/14/2031   Pneumonia Vaccine 3+ Years old  Completed   INFLUENZA VACCINE  Completed   Zoster Vaccines- Shingrix  Completed   HPV VACCINES  Aged Out    Health Maintenance  Health Maintenance Due  Topic Date Due   COVID-19 Vaccine (7 - 2024-25 season) 09/09/2022   Health Maintenance Items Addressed: 03/19/2023   Additional Screening:  Vision Screening: Recommended annual ophthalmology exams for early detection of glaucoma and other disorders of the eye. Pt stated has annual eye exam with Dr Marchelle Gearing.  Dental Screening: Recommended annual dental exams for proper oral hygiene  Community Resource Referral / Chronic Care Management: CRR required this visit?  No   CCM required this visit?  No     Plan:     I have personally reviewed and noted the following in the patient's chart:   Medical and social history Use of alcohol, tobacco or illicit drugs  Current medications and supplements including opioid prescriptions. Patient is not currently taking opioid prescriptions. Functional ability and status Nutritional status Physical activity Advanced directives List of other physicians Hospitalizations, surgeries, and ER visits in previous 12 months Vitals Screenings to include cognitive, depression, and falls Referrals and appointments  In addition, I have reviewed and discussed with patient certain preventive protocols, quality metrics, and best practice recommendations. A written personalized care plan for preventive services as well as general preventive health recommendations were provided to patient.     Darreld Mclean, CMA   03/19/2023   After Visit Summary: (MyChart) Due to this  being a telephonic visit, the after visit summary with patients personalized plan was offered to patient via MyChart   Notes: Nothing significant to report at this time.

## 2023-03-19 NOTE — Patient Instructions (Addendum)
 Mr. Downie , Thank you for taking time to come for your Medicare Wellness Visit. I appreciate your ongoing commitment to your health goals. Please review the following plan we discussed and let me know if I can assist you in the future.   Referrals/Orders/Follow-Ups/Clinician Recommendations: Aim for 30 minutes of exercise or brisk walking, 6-8 glasses of water, and 5 servings of fruits and vegetables each day.   This is a list of the screening recommended for you and due dates:  Health Maintenance  Topic Date Due   COVID-19 Vaccine (7 - 2024-25 season) 09/09/2022   Medicare Annual Wellness Visit  03/18/2024   DTaP/Tdap/Td vaccine (3 - Td or Tdap) 04/14/2031   Pneumonia Vaccine  Completed   Flu Shot  Completed   Zoster (Shingles) Vaccine  Completed   HPV Vaccine  Aged Out    Advanced directives: (Copy Requested) Please bring a copy of your health care power of attorney and living will to the office to be added to your chart at your convenience. You can mail to [facility] [address] [place], [place] [address] or email to [email or web address]  Next Medicare Annual Wellness Visit scheduled for next year: Yes - 03/2024

## 2023-04-02 ENCOUNTER — Ambulatory Visit (INDEPENDENT_AMBULATORY_CARE_PROVIDER_SITE_OTHER): Admitting: Internal Medicine

## 2023-04-02 ENCOUNTER — Encounter: Payer: Self-pay | Admitting: Internal Medicine

## 2023-04-02 VITALS — BP 140/78 | HR 63 | Temp 98.1°F | Ht 66.0 in | Wt 155.0 lb

## 2023-04-02 DIAGNOSIS — I712 Thoracic aortic aneurysm, without rupture, unspecified: Secondary | ICD-10-CM | POA: Diagnosis not present

## 2023-04-02 DIAGNOSIS — I10 Essential (primary) hypertension: Secondary | ICD-10-CM | POA: Diagnosis not present

## 2023-04-02 DIAGNOSIS — R7301 Impaired fasting glucose: Secondary | ICD-10-CM

## 2023-04-02 DIAGNOSIS — Z0000 Encounter for general adult medical examination without abnormal findings: Secondary | ICD-10-CM | POA: Diagnosis not present

## 2023-04-02 DIAGNOSIS — J479 Bronchiectasis, uncomplicated: Secondary | ICD-10-CM

## 2023-04-02 DIAGNOSIS — I700 Atherosclerosis of aorta: Secondary | ICD-10-CM | POA: Diagnosis not present

## 2023-04-02 LAB — CBC
HCT: 41.7 % (ref 39.0–52.0)
Hemoglobin: 13.6 g/dL (ref 13.0–17.0)
MCHC: 32.6 g/dL (ref 30.0–36.0)
MCV: 90.9 fl (ref 78.0–100.0)
Platelets: 127 10*3/uL — ABNORMAL LOW (ref 150.0–400.0)
RBC: 4.58 Mil/uL (ref 4.22–5.81)
RDW: 13.8 % (ref 11.5–15.5)
WBC: 7.1 10*3/uL (ref 4.0–10.5)

## 2023-04-02 LAB — HEMOGLOBIN A1C: Hgb A1c MFr Bld: 6.1 % (ref 4.6–6.5)

## 2023-04-02 LAB — COMPREHENSIVE METABOLIC PANEL
ALT: 13 U/L (ref 0–53)
AST: 18 U/L (ref 0–37)
Albumin: 4.1 g/dL (ref 3.5–5.2)
Alkaline Phosphatase: 116 U/L (ref 39–117)
BUN: 38 mg/dL — ABNORMAL HIGH (ref 6–23)
CO2: 31 meq/L (ref 19–32)
Calcium: 9.5 mg/dL (ref 8.4–10.5)
Chloride: 106 meq/L (ref 96–112)
Creatinine, Ser: 1.13 mg/dL (ref 0.40–1.50)
GFR: 58.81 mL/min — ABNORMAL LOW (ref >60.00)
Glucose, Bld: 86 mg/dL (ref 70–99)
Potassium: 4.4 meq/L (ref 3.5–5.1)
Sodium: 142 meq/L (ref 135–145)
Total Bilirubin: 0.7 mg/dL (ref 0.2–1.2)
Total Protein: 6.8 g/dL (ref 6.0–8.3)

## 2023-04-02 LAB — LIPID PANEL
Cholesterol: 187 mg/dL (ref 0–200)
HDL: 53.9 mg/dL (ref >39.00)
LDL Cholesterol: 97 mg/dL (ref 0–99)
NonHDL: 132.63
Total CHOL/HDL Ratio: 3
Triglycerides: 180 mg/dL — ABNORMAL HIGH (ref 0.0–149.0)
VLDL: 36 mg/dL (ref 0.0–40.0)

## 2023-04-02 MED ORDER — AMLODIPINE BESYLATE 2.5 MG PO TABS: 2.5000 mg | ORAL_TABLET | Freq: Every day | ORAL | 3 refills | Status: AC

## 2023-04-02 NOTE — Assessment & Plan Note (Signed)
 Flu shot up to date. Pneumonia complete. Shingrix complete. Tetanus up to date. Colonoscopy aged out. Counseled about sun safety and mole surveillance. Counseled about the dangers of distracted driving. Given 10 year screening recommendations.

## 2023-04-02 NOTE — Assessment & Plan Note (Signed)
 BP at goal on amlodipine 2.5 mg daily. Checking CMP and CBC and adjust as needed.

## 2023-04-02 NOTE — Assessment & Plan Note (Signed)
 Not on statin currently checking lipid panel. He is active.

## 2023-04-02 NOTE — Assessment & Plan Note (Signed)
 No cough or SOB. Continue monitoring.

## 2023-04-02 NOTE — Progress Notes (Signed)
   Subjective:   Patient ID: Danny Gross, male    DOB: Sep 07, 1934, 89 y.o.   MRN: [id number]  HPI The patient is here for physical.  PMH, [facility], social history reviewed and updated  Review of Systems  Constitutional: Negative.   HENT: Negative.    Eyes: Negative.   Respiratory:  Negative for cough, chest tightness and shortness of breath.   Cardiovascular:  Negative for chest pain, palpitations and leg swelling.  Gastrointestinal:  Negative for abdominal distention, abdominal pain, constipation, diarrhea, nausea and vomiting.  Musculoskeletal: Negative.   Skin: Negative.   Neurological: Negative.   Psychiatric/Behavioral: Negative.      Objective:  Physical Exam Constitutional:      Appearance: He is well-developed.  HENT:     Head: Normocephalic and atraumatic.  Cardiovascular:     Rate and Rhythm: Normal rate and regular rhythm.  Pulmonary:     Effort: Pulmonary effort is normal. No respiratory distress.     Breath sounds: Normal breath sounds. No wheezing or rales.  Abdominal:     General: Bowel sounds are normal. There is no distension.     Palpations: Abdomen is soft.     Tenderness: There is no abdominal tenderness. There is no rebound.  Musculoskeletal:     Cervical back: Normal range of motion.  Skin:    General: Skin is warm and dry.  Neurological:     Mental Status: He is alert and oriented to person, place, and time.     Coordination: Coordination normal.     Vitals:   04/02/23 1309 04/02/23 1315  BP: (!) 140/78 (!) 140/78  Pulse: 63   Temp: 98.1 F (36.7 C)   TempSrc: Oral   SpO2: 93%   Weight: 155 lb (70.3 kg)   Height: 5\' 6"  (1.676 m)     Assessment & Plan:

## 2023-04-02 NOTE — Assessment & Plan Note (Signed)
 Checking Hga1c and adjust as needed.

## 2023-04-02 NOTE — Assessment & Plan Note (Signed)
 Due for follow up Aug 2025 and have ordered today.

## 2023-04-22 ENCOUNTER — Ambulatory Visit
Admission: RE | Admit: 2023-04-22 | Discharge: 2023-04-22 | Disposition: A | Discharge status: Home / Self Care | Source: Ambulatory Visit | Attending: Internal Medicine | Admitting: Internal Medicine

## 2023-04-22 DIAGNOSIS — J479 Bronchiectasis, uncomplicated: Secondary | ICD-10-CM | POA: Diagnosis not present

## 2023-04-22 DIAGNOSIS — I712 Thoracic aortic aneurysm, without rupture, unspecified: Secondary | ICD-10-CM

## 2023-04-22 DIAGNOSIS — I7121 Aneurysm of the ascending aorta, without rupture: Secondary | ICD-10-CM | POA: Diagnosis not present

## 2023-04-22 DIAGNOSIS — I700 Atherosclerosis of aorta: Secondary | ICD-10-CM | POA: Diagnosis not present

## 2023-05-06 ENCOUNTER — Encounter: Payer: Self-pay | Admitting: Internal Medicine

## 2023-05-27 NOTE — Progress Notes (Addendum)
 Office Visit Note   Patient: Danny Gross           Date of Birth: 29-Dec-1934           MRN: [id number] Visit Date: 05/28/2023              Requested by: Rollene Almarie LABOR, MD [address] [place],  [place] [address] PCP: Rollene Almarie LABOR, MD   Assessment & Plan: Visit Diagnoses:  1. Primary osteoarthritis of left hip     Plan: History of Present Illness Danny Gross is an 89 year old male who presents with left hip pain.  He has experienced left hip pain for at least a year, similar to the pain before his right hip replacement. The pain is worsening monthly. He feels a 'tweak' in the hip when going uphill and experiences pain and a click with internal rotation. There is no pain with flexion or external rotation. Pain occurs when walking uphill.  He underwent a successful right hip replacement in 2024 with a rapid recovery, requiring pain medication for only three days before switching to Advil.  Physical Exam MUSCULOSKELETAL: Left hip flexion and external rotation without pain. Left hip internal rotation with mild pain and click.  Assessment and Plan Left hip osteoarthritis Chronic left hip pain worsening over the past year. X-rays indicate bone on bone changes.  - Order updated left hip x-ray. - Obtain preoperative clearance from Dr. Rollene. - Plan left hip replacement surgery with overnight hospital stay. - Prescribe tramadol  for postoperative pain. - Arrange postoperative physical therapy. - detailed surgical plan discussed  Impression is severe left hip degenerative joint disease secondary to Osteoarthritis.  Patient has attempted conservative treatment for at least 6 consecutive weeks within the past 12 weeks, including but not limited to physical therapy, home exercise program, NSAIDs, activity modification, and/or corticosteroid injections. Despite these efforts, symptoms have not improved or have worsened. Conservative measures have been deemed  unsuccessful at this time. After a detailed discussion covering diagnosis and treatment options--including the risks, benefits, alternatives, and potential complications of surgical and nonsurgical management--the patient elected to proceed with surgery.  Follow-Up Instructions: No follow-ups on file.   Orders:  Orders Placed This Encounter  Procedures   XR Pelvis 1-2 Views   No orders of the defined types were placed in this encounter.   Subjective: Chief Complaint  Patient presents with   Left Hip - Pain    HPI  Review of Systems  Constitutional: Negative.   HENT: Negative.    Eyes: Negative.   Respiratory: Negative.    Cardiovascular: Negative.   Gastrointestinal: Negative.   Endocrine: Negative.   Genitourinary: Negative.   Skin: Negative.   Allergic/Immunologic: Negative.   Neurological: Negative.   Hematological: Negative.   Psychiatric/Behavioral: Negative.    All other systems reviewed and are negative.    Objective: Vital Signs: There were no vitals taken for this visit.  Physical Exam Vitals and nursing note reviewed.  Constitutional:      Appearance: He is well-developed.  HENT:     Head: Normocephalic and atraumatic.  Eyes:     Pupils: Pupils are equal, round, and reactive to light.  Pulmonary:     Effort: Pulmonary effort is normal.  Abdominal:     Palpations: Abdomen is soft.  Musculoskeletal:        General: Normal range of motion.     Cervical back: Neck supple.  Skin:    General: Skin is warm.  Neurological:     Mental Status: He is alert and oriented to person, place, and time.  Psychiatric:        Behavior: Behavior normal.        Thought Content: Thought content normal.        Judgment: Judgment normal.    Imaging: XR Pelvis 1-2 Views Result Date: 05/28/2023 Advanced degenerative joint disease with bone on bone joint space narrowing of the left hip.    PMFS History: Patient Active Problem List   Diagnosis Date Noted    Essential hypertension 08/14/2022   Bilateral hip joint arthritis 03/13/2022   Thoracic aortic aneurysm (HCC) 06/07/2021   Aortic atherosclerosis (HCC) 07/19/2020   Bronchiectasis without complication (HCC) 04/22/2018   Impaired fasting glucose 10/04/2014   Routine general medical examination at a health care facility 12/21/2013   Past Medical History:  Diagnosis Date   Arthritis    Cataract    Emphysema of lung (HCC)    Glaucoma about 78   Dr. Octavia - says severe   Hypertension    Pneumonia 2015    Family History  Problem Relation Age of Onset   Cancer Maternal Aunt        ? type     Past Surgical History:  Procedure Laterality Date   EYE SURGERY  about 2010   laser cataract stuff   HERNIA REPAIR  01/08/2001   TONSILLECTOMY  01/08/1937   TOTAL HIP ARTHROPLASTY Right 09/06/2022   Procedure: TOTAL HIP ARTHROPLASTY ANTERIOR APPROACH;  Surgeon: Jerri Kay HERO, MD;  Location: [facility] OR;  Service: Orthopedics;  Laterality: Right;  3-C   VIDEO BRONCHOSCOPY Bilateral 08/11/2013   Procedure: VIDEO BRONCHOSCOPY WITH FLUORO;  Surgeon: Ozell KATHEE America, MD;  Location: [facility];  Service: Cardiopulmonary;  Laterality: Bilateral;   Social History   Occupational History   Occupation: Retired  Tobacco Use   Smoking status: Never    Passive exposure: Never   Smokeless tobacco: Never   Tobacco comments:    never smoked  Vaping Use   Vaping status: Never Used  Substance and Sexual Activity   Alcohol use: Yes    Alcohol/week: 1.0 standard drink of alcohol    Types: 1 Shots of liquor per week    Comment: 1 cocktails/week   Drug use: No   Sexual activity: Not Currently

## 2023-05-28 ENCOUNTER — Ambulatory Visit: Admitting: Orthopaedic Surgery

## 2023-05-28 ENCOUNTER — Other Ambulatory Visit (INDEPENDENT_AMBULATORY_CARE_PROVIDER_SITE_OTHER)

## 2023-05-28 ENCOUNTER — Telehealth: Payer: Self-pay

## 2023-05-28 DIAGNOSIS — M1612 Unilateral primary osteoarthritis, left hip: Secondary | ICD-10-CM

## 2023-05-28 NOTE — Telephone Encounter (Signed)
 Clearance form given to patient to take to PCP, Dr.Elizabeth Crawford.  Patient aware that we must receive clearance before proceeding with scheduling surgery.

## 2023-06-10 ENCOUNTER — Encounter: Payer: Self-pay | Admitting: Internal Medicine

## 2023-06-10 ENCOUNTER — Ambulatory Visit: Admitting: Internal Medicine

## 2023-06-10 VITALS — BP 138/84 | HR 60 | Temp 97.9°F | Ht 66.0 in | Wt 153.5 lb

## 2023-06-10 DIAGNOSIS — Z01810 Encounter for preprocedural cardiovascular examination: Secondary | ICD-10-CM | POA: Diagnosis not present

## 2023-06-10 NOTE — Progress Notes (Signed)
   Subjective:   Patient ID: Danny Gross, male    DOB: 01/27/1934, 89 y.o.   MRN: [id number]  HPI The patient is an 89 YO man coming in for pre-op for left hip replacement. Walking daily and active. Denies chest pains or SOB.   PMH, [facility], social history reviewed and updated  Review of Systems  Constitutional: Negative.   HENT: Negative.    Eyes: Negative.   Respiratory:  Negative for cough, chest tightness and shortness of breath.   Cardiovascular:  Negative for chest pain, palpitations and leg swelling.  Gastrointestinal:  Negative for abdominal distention, abdominal pain, constipation, diarrhea, nausea and vomiting.  Musculoskeletal:  Positive for arthralgias.  Skin: Negative.   Neurological: Negative.   Psychiatric/Behavioral: Negative.      Objective:  Physical Exam Constitutional:      Appearance: He is well-developed.  HENT:     Head: Normocephalic and atraumatic.  Cardiovascular:     Rate and Rhythm: Normal rate and regular rhythm.  Pulmonary:     Effort: Pulmonary effort is normal. No respiratory distress.     Breath sounds: Normal breath sounds. No wheezing or rales.  Abdominal:     General: Bowel sounds are normal. There is no distension.     Palpations: Abdomen is soft.     Tenderness: There is no abdominal tenderness. There is no rebound.  Musculoskeletal:        General: Tenderness present.     Cervical back: Normal range of motion.  Skin:    General: Skin is warm and dry.  Neurological:     Mental Status: He is alert and oriented to person, place, and time.     Coordination: Coordination normal.     Vitals:   06/10/23 0839  BP: 138/84  Pulse: 60  Temp: 97.9 F (36.6 C)  TempSrc: Temporal  SpO2: 97%  Weight: 153 lb 8 oz (69.6 kg)  Height: 5\' 6"  (1.676 m)   EKG: Rate 62, axis normal, interval rbbb, sinus with some PACs, no st or t wave changes, no significant change compared to prior  Assessment & Plan:  Visit time 20 minutes in face to  face communication with patient and coordination of care, additional 10 minutes spent in record review, coordination or care, ordering tests, communicating/referring to other healthcare professionals, documenting in medical records all on the same day of the visit for total time 30 minutes spent on the visit.

## 2023-06-10 NOTE — Patient Instructions (Signed)
 It is okay to do the hip replacement surgery.

## 2023-06-10 NOTE — Assessment & Plan Note (Signed)
 EKG done and stable from prior. No new anginal symptoms or SOB. He is active and >4 mets. No contraindication to surgery or further pre-op testing recommended he is low risk. Papers filled out for surgeon. Counseled about risk of opioid induced constipation and bowel regimen post op if opioids needed.

## 2023-06-18 DIAGNOSIS — D692 Other nonthrombocytopenic purpura: Secondary | ICD-10-CM | POA: Diagnosis not present

## 2023-06-18 DIAGNOSIS — C44519 Basal cell carcinoma of skin of other part of trunk: Secondary | ICD-10-CM | POA: Diagnosis not present

## 2023-06-18 DIAGNOSIS — B078 Other viral warts: Secondary | ICD-10-CM | POA: Diagnosis not present

## 2023-06-18 DIAGNOSIS — L821 Other seborrheic keratosis: Secondary | ICD-10-CM | POA: Diagnosis not present

## 2023-06-18 DIAGNOSIS — L853 Xerosis cutis: Secondary | ICD-10-CM | POA: Diagnosis not present

## 2023-06-18 DIAGNOSIS — L570 Actinic keratosis: Secondary | ICD-10-CM | POA: Diagnosis not present

## 2023-06-18 DIAGNOSIS — Z85828 Personal history of other malignant neoplasm of skin: Secondary | ICD-10-CM | POA: Diagnosis not present

## 2023-06-18 DIAGNOSIS — D485 Neoplasm of uncertain behavior of skin: Secondary | ICD-10-CM | POA: Diagnosis not present

## 2023-06-26 DIAGNOSIS — Z961 Presence of intraocular lens: Secondary | ICD-10-CM | POA: Diagnosis not present

## 2023-06-26 DIAGNOSIS — H0102B Squamous blepharitis left eye, upper and lower eyelids: Secondary | ICD-10-CM | POA: Diagnosis not present

## 2023-06-26 DIAGNOSIS — H0102A Squamous blepharitis right eye, upper and lower eyelids: Secondary | ICD-10-CM | POA: Diagnosis not present

## 2023-06-26 DIAGNOSIS — H43812 Vitreous degeneration, left eye: Secondary | ICD-10-CM | POA: Diagnosis not present

## 2023-06-26 DIAGNOSIS — H401233 Low-tension glaucoma, bilateral, severe stage: Secondary | ICD-10-CM | POA: Diagnosis not present

## 2023-08-20 ENCOUNTER — Telehealth: Payer: Self-pay

## 2023-08-20 NOTE — Telephone Encounter (Signed)
 Insurance company is requesting additional clinical before they can approved surgery. Can this please be added to the last office note.  1. Conservative treatments attempted for left hip pain. (anti-inflammatory medications or analgesics, OR flexibility and muscle strengthening exercises, OR supervised physical therapy [ADLs diminished despite completing a plan of care], OR assistive device use, OR weight reduction as appropriate, OR therapeutic injections into the hip as appropriate).

## 2023-08-20 NOTE — Telephone Encounter (Signed)
 Done.  Let me know if you need anything further.  Thank you.

## 2023-08-27 ENCOUNTER — Ambulatory Visit: Payer: Medicare PPO | Admitting: Orthopaedic Surgery

## 2023-08-29 ENCOUNTER — Other Ambulatory Visit (INDEPENDENT_AMBULATORY_CARE_PROVIDER_SITE_OTHER): Payer: Self-pay

## 2023-08-29 ENCOUNTER — Ambulatory Visit: Admitting: Orthopaedic Surgery

## 2023-08-29 ENCOUNTER — Telehealth: Payer: Self-pay | Admitting: *Deleted

## 2023-08-29 DIAGNOSIS — Z96641 Presence of right artificial hip joint: Secondary | ICD-10-CM

## 2023-08-29 NOTE — Telephone Encounter (Signed)
 Ortho bundle 1 year in office visit completed for Right THA.

## 2023-08-29 NOTE — Progress Notes (Signed)
   Post-Op Visit Note   Patient: Danny Gross           Date of Birth: Aug 26, 1934           MRN: [id number] Visit Date: 08/29/2023 PCP: Rollene Almarie LABOR, MD   Assessment & Plan:  Chief Complaint:  Chief Complaint  Patient presents with   Right Hip - Follow-up    Right THA 09/06/2022   Visit Diagnoses:  1. History of right hip replacement     Plan: History of Present Illness Danny Gross is an 89 year old male who presents for follow-up and evaluation for right hip replacement.  Patient is 1 year postop from a right total hip arthroplasty.  Everything is going well and he is happy with the outcome.  He has no complaints.  Physical Exam Right hip surgical scar fully healed.  Fluid painless range of motion.  Assessment and Plan Danny Gross is 1 year postop from a right total hip arthroplasty.  He has done very well and very satisfied with the outcome.  We look forward to seeing him in October to do the left hip replacement.  Plan is to keep him overnight like last time.  Follow-Up Instructions: No follow-ups on file.   Orders:  Orders Placed This Encounter  Procedures   XR Pelvis 1-2 Views   No orders of the defined types were placed in this encounter.   Imaging: XR Pelvis 1-2 Views Result Date: 08/29/2023 Stable right total hip replacement without complications   PMFS History: Patient Active Problem List   Diagnosis Date Noted   Pre-operative cardiovascular examination 06/10/2023   Essential hypertension 08/14/2022   Bilateral hip joint arthritis 03/13/2022   Thoracic aortic aneurysm (HCC) 06/07/2021   Aortic atherosclerosis (HCC) 07/19/2020   Bronchiectasis without complication (HCC) 04/22/2018   Impaired fasting glucose 10/04/2014   Routine general medical examination at a health care facility 12/21/2013   Past Medical History:  Diagnosis Date   Arthritis    Cataract    Emphysema of lung (HCC)    Glaucoma about 83   Dr. Octavia - says severe    Hypertension    Pneumonia 2015    Family History  Problem Relation Age of Onset   Cancer Maternal Aunt        ? type     Past Surgical History:  Procedure Laterality Date   EYE SURGERY  about 2010   laser cataract stuff   HERNIA REPAIR  01/08/2001   TONSILLECTOMY  01/08/1937   TOTAL HIP ARTHROPLASTY Right 09/06/2022   Procedure: TOTAL HIP ARTHROPLASTY ANTERIOR APPROACH;  Surgeon: Jerri Kay HERO, MD;  Location: [facility] OR;  Service: Orthopedics;  Laterality: Right;  3-C   VIDEO BRONCHOSCOPY Bilateral 08/11/2013   Procedure: VIDEO BRONCHOSCOPY WITH FLUORO;  Surgeon: Ozell KATHEE America, MD;  Location: [facility];  Service: Cardiopulmonary;  Laterality: Bilateral;   Social History   Occupational History   Occupation: Retired  Tobacco Use   Smoking status: Never    Passive exposure: Never   Smokeless tobacco: Never   Tobacco comments:    never smoked  Vaping Use   Vaping status: Never Used  Substance and Sexual Activity   Alcohol use: Yes    Alcohol/week: 1.0 standard drink of alcohol    Types: 1 Shots of liquor per week    Comment: 1 cocktails/week   Drug use: No   Sexual activity: Not Currently

## 2023-09-18 ENCOUNTER — Other Ambulatory Visit: Payer: Self-pay | Admitting: Internal Medicine

## 2023-09-18 MED ORDER — COVID-19 MRNA VACC (MODERNA) 50 MCG/0.5ML IM SUSP: 0.5000 mL | Freq: Once | INTRAMUSCULAR | 0 refills | Status: AC

## 2023-10-14 DIAGNOSIS — M160 Bilateral primary osteoarthritis of hip: Secondary | ICD-10-CM

## 2023-10-29 ENCOUNTER — Encounter: Admitting: Physician Assistant

## 2023-11-05 ENCOUNTER — Other Ambulatory Visit: Payer: Self-pay | Admitting: Physician Assistant

## 2023-11-05 MED ORDER — DOCUSATE SODIUM 100 MG PO CAPS: 100.0000 mg | ORAL_CAPSULE | Freq: Every day | ORAL | 2 refills | Status: AC | PRN

## 2023-11-05 MED ORDER — OXYCODONE-ACETAMINOPHEN 5-325 MG PO TABS
1.0000 | ORAL_TABLET | Freq: Four times a day (QID) | ORAL | 0 refills | Status: AC | PRN
Start: 2023-11-05 — End: ?

## 2023-11-05 MED ORDER — METHOCARBAMOL 750 MG PO TABS: 750.0000 mg | ORAL_TABLET | Freq: Three times a day (TID) | ORAL | 2 refills | Status: AC | PRN

## 2023-11-05 MED ORDER — ONDANSETRON HCL 4 MG PO TABS: 4.0000 mg | ORAL_TABLET | Freq: Three times a day (TID) | ORAL | 0 refills | Status: AC | PRN

## 2023-11-05 MED ORDER — ASPIRIN 81 MG PO CHEW: 81.0000 mg | CHEWABLE_TABLET | Freq: Two times a day (BID) | ORAL | 0 refills | Status: AC

## 2023-11-08 NOTE — Progress Notes (Signed)
 Surgical Instructions   Your procedure is scheduled on November 18, 2023. Report to [facility] Main Entrance A at 5:30 A.M., then check in with the Admitting office. Any questions or running late day of surgery: call [phone]  Questions prior to your surgery date: call [phone], Monday-Friday, 8am-4pm. If you experience any cold or flu symptoms such as cough, fever, chills, shortness of breath, etc. between now and your scheduled surgery, please notify us  at the above number.     Remember:  Do not eat after midnight the night before your surgery  You may drink clear liquids until 4:15 the morning of your surgery.   Clear liquids allowed are: Water , Non-Citrus Juices (without pulp), Carbonated Beverages, Clear Tea (no milk, honey, etc.), Black Coffee Only (NO MILK, CREAM OR POWDERED CREAMER of any kind), and Gatorade. Patient Instructions  The night before surgery:  No food after midnight. ONLY clear liquids after midnight  The day of surgery (if you do NOT have diabetes):  Drink ONE (1) Pre-Surgery Clear Ensure by 4:15 the morning of surgery. Drink in one sitting. Do not sip.  This drink was given to you during your hospital  pre-op appointment visit.  Nothing else to drink after completing the  Pre-Surgery Clear Ensure.         If you have questions, please contact your surgeon's office.    Take these medicines the morning of surgery with A SIP OF WATER   brimonidine-timolol (COMBIGAN)  dorzolamide (TRUSOPT)  methocarbamol  (ROBAXIN )  ondansetron  (ZOFRAN )   May take these medicines IF NEEDED:    One week prior to surgery, STOP taking any Aspirin  (unless otherwise instructed by your surgeon) Aleve, Naproxen, Ibuprofen, Motrin, Advil, Goody's, BC's, all herbal medications, fish oil, and non-prescription vitamins.   aspirin   Please follow instructions given to you by surgeon or reach to office for further instructions                Do NOT Smoke (Tobacco/Vaping)  for 24 hours prior to your procedure.  If you use a CPAP at night, you may bring your mask/headgear for your overnight stay.   You will be asked to remove any contacts, glasses, piercing's, hearing aid's, dentures/partials prior to surgery. Please bring cases for these items if needed.    Patients discharged the day of surgery will not be allowed to drive home, and someone needs to stay with them for 24 hours.  SURGICAL WAITING ROOM VISITATION Patients may have no more than 2 support people in the waiting area - these visitors may rotate.   Pre-op nurse will coordinate an appropriate time for 1 ADULT support person, who may not rotate, to accompany patient in pre-op.  Children under the age of 31 must have an adult with them who is not the patient and must remain in the main waiting area with an adult.  If the patient needs to stay at the hospital during part of their recovery, the visitor guidelines for inpatient rooms apply.  Please refer to the [facility] website for the visitor guidelines for any additional information.   If you received a COVID test during your pre-op visit  it is requested that you wear a mask when out in public, stay away from anyone that may not be feeling well and notify your surgeon if you develop symptoms. If you have been in contact with anyone that has tested positive in the last 10 days please notify you surgeon.      Pre-operative 4  CHG Bathing Instructions   You can play a key role in reducing the risk of infection after surgery. Your skin needs to be as free of germs as possible. You can reduce the number of germs on your skin by washing with CHG (chlorhexidine  gluconate) soap before surgery. CHG is an antiseptic soap that kills germs and continues to kill germs even after washing.   DO NOT use if you have an allergy to chlorhexidine /CHG or antibacterial soaps. If your skin becomes reddened or irritated, stop using the CHG and notify one of our RNs at  [phone].   Please shower with the CHG soap starting 4 days before surgery using the following schedule:     Please keep in mind the following:  DO NOT shave, including legs and underarms, starting the day of your first shower.   You may shave your face at any point before/day of surgery.  Place clean sheets on your bed the day you start using CHG soap. Use a clean washcloth (not used since being washed) for each shower. DO NOT sleep with pets once you start using the CHG.   CHG Shower Instructions:  Wash your face and private area with normal soap. If you choose to wash your hair, wash first with your normal shampoo.  After you use shampoo/soap, rinse your hair and body thoroughly to remove shampoo/soap residue.  Turn the water  OFF and apply  bottle of CHG soap to a CLEAN washcloth.  Apply CHG soap ONLY FROM YOUR NECK DOWN TO YOUR TOES (washing for 3-5 minutes)  DO NOT use CHG soap on face, private areas, open wounds, or sores.  Pay special attention to the area where your surgery is being performed.  If you are having back surgery, having someone wash your back for you may be helpful. Wait 2 minutes after CHG soap is applied, then you may rinse off the CHG soap.  Pat dry with a clean towel  Put on clean clothes/pajamas   If you choose to wear lotion, please use ONLY the CHG-compatible lotions that are listed below.  Additional instructions for the day of surgery:  If you choose, you may shower the morning of surgery with an antibacterial soap.  DO NOT APPLY any lotions, deodorants, cologne, or perfumes.   Do not bring valuables to the hospital. [facility] is not responsible for any belongings/valuables. Do not wear nail polish, gel polish, artificial nails, or any other type of covering on natural nails (fingers and toes) Do not wear jewelry or makeup Put on clean/comfortable clothes.  Please brush your teeth.  Ask your nurse before applying any prescription medications to  the skin.     CHG Compatible Lotions   Aveeno Moisturizing lotion  Cetaphil Moisturizing Cream  Cetaphil Moisturizing Lotion  Clairol Herbal Essence Moisturizing Lotion, Dry Skin  Clairol Herbal Essence Moisturizing Lotion, Extra Dry Skin  Clairol Herbal Essence Moisturizing Lotion, Normal Skin  Curel Age Defying Therapeutic Moisturizing Lotion with Alpha Hydroxy  Curel Extreme Care Body Lotion  Curel Soothing Hands Moisturizing Hand Lotion  Curel Therapeutic Moisturizing Cream, Fragrance-Free  Curel Therapeutic Moisturizing Lotion, Fragrance-Free  Curel Therapeutic Moisturizing Lotion, Original Formula  Eucerin Daily Replenishing Lotion  Eucerin Dry Skin Therapy Plus Alpha Hydroxy Crme  Eucerin Dry Skin Therapy Plus Alpha Hydroxy Lotion  Eucerin Original Crme  Eucerin Original Lotion  Eucerin Plus Crme Eucerin Plus Lotion  Eucerin TriLipid Replenishing Lotion  Keri Anti-Bacterial Hand Lotion  Keri Deep Conditioning Original Lotion Dry Skin Formula  Softly Scented  Keri Deep Conditioning Original Lotion, Fragrance Free Sensitive Skin Formula  Keri Lotion Fast Absorbing Fragrance Free Sensitive Skin Formula  Keri Lotion Fast Absorbing Softly Scented Dry Skin Formula  Keri Original Lotion  Keri Skin Renewal Lotion Keri Silky Smooth Lotion  Keri Silky Smooth Sensitive Skin Lotion  Nivea Body Creamy Conditioning Oil  Nivea Body Extra Enriched Lotion  Nivea Body Original Lotion  Nivea Body Sheer Moisturizing Lotion Nivea Crme  Nivea Skin Firming Lotion  NutraDerm 30 Skin Lotion  NutraDerm Skin Lotion  NutraDerm Therapeutic Skin Cream  NutraDerm Therapeutic Skin Lotion  ProShield Protective Hand Cream  Provon moisturizing lotion  Please read over the following fact sheets that you were given.

## 2023-11-11 ENCOUNTER — Encounter (HOSPITAL_COMMUNITY): Payer: Self-pay

## 2023-11-11 ENCOUNTER — Encounter (HOSPITAL_COMMUNITY)
Admission: RE | Admit: 2023-11-11 | Discharge: 2023-11-11 | Disposition: A | Discharge status: Home / Self Care | Source: Ambulatory Visit | Attending: Orthopaedic Surgery | Admitting: Orthopaedic Surgery

## 2023-11-11 ENCOUNTER — Other Ambulatory Visit: Payer: Self-pay

## 2023-11-11 ENCOUNTER — Encounter: Payer: Self-pay | Admitting: Radiology

## 2023-11-11 VITALS — BP 150/91 | HR 57 | Temp 97.9°F | Resp 18 | Ht 69.0 in | Wt 156.0 lb

## 2023-11-11 DIAGNOSIS — Z01812 Encounter for preprocedural laboratory examination: Secondary | ICD-10-CM | POA: Insufficient documentation

## 2023-11-11 DIAGNOSIS — M160 Bilateral primary osteoarthritis of hip: Secondary | ICD-10-CM | POA: Insufficient documentation

## 2023-11-11 DIAGNOSIS — Z01818 Encounter for other preprocedural examination: Secondary | ICD-10-CM

## 2023-11-11 LAB — BASIC METABOLIC PANEL WITH GFR
Anion gap: 9 (ref 5–15)
BUN: 23 mg/dL (ref 8–23)
CO2: 28 mmol/L (ref 22–32)
Calcium: 9.2 mg/dL (ref 8.9–10.3)
Chloride: 102 mmol/L (ref 98–111)
Creatinine, Ser: 1.17 mg/dL (ref 0.61–1.24)
GFR, Estimated: >60 mL/min (ref >60)
Glucose, Bld: 110 mg/dL — ABNORMAL HIGH (ref 70–99)
Potassium: 4.3 mmol/L (ref 3.5–5.1)
Sodium: 139 mmol/L (ref 135–145)

## 2023-11-11 LAB — TYPE AND SCREEN
ABO/RH(D): O POS
Antibody Screen: NEGATIVE

## 2023-11-11 LAB — SURGICAL PCR SCREEN
MRSA, PCR: NEGATIVE
Staphylococcus aureus: NEGATIVE

## 2023-11-11 LAB — CBC
HCT: 43.8 % (ref 39.0–52.0)
Hemoglobin: 14 g/dL (ref 13.0–17.0)
MCH: 29.2 pg (ref 26.0–34.0)
MCHC: 32 g/dL (ref 30.0–36.0)
MCV: 91.4 fL (ref 80.0–100.0)
Platelets: 124 K/uL — ABNORMAL LOW (ref 150–400)
RBC: 4.79 MIL/uL (ref 4.22–5.81)
RDW: 13.3 % (ref 11.5–15.5)
WBC: 6.6 K/uL (ref 4.0–10.5)
nRBC: 0 % (ref 0.0–0.2)

## 2023-11-11 NOTE — Progress Notes (Signed)
 PCP - Dlaniel Paterson,MD Cardiologist - denies  PPM/ICD - denies Device Orders -  Rep Notified -   Chest x-ray - na EKG - 06/10/23 Stress Test - denies ECHO - denies Cardiac Cath - denies  Sleep Study - denies CPAP - no  Fasting Blood Sugar - na Checks Blood Sugar _____ times a day  Last dose of GLP1 agonist-  na GLP1 instructions: na  Blood Thinner Instructions:na Aspirin  Instructions:na  ERAS Protcol -clears until 0415 PRE-SURGERY Ensure or G2- Ensure  COVID TEST- na   Anesthesia review: yes- pre operative cardiovascular clearance by Almarie Crawford,MD 06/10/23  Patient denies shortness of breath, fever, cough and chest pain at PAT appointment   All instructions explained to the patient, with a verbal understanding of the material. Patient agrees to go over the instructions while at home for a better understanding. The opportunity to ask questions was provided.

## 2023-11-12 DIAGNOSIS — I7121 Aneurysm of the ascending aorta, without rupture: Secondary | ICD-10-CM | POA: Diagnosis not present

## 2023-11-12 DIAGNOSIS — M1612 Unilateral primary osteoarthritis, left hip: Secondary | ICD-10-CM | POA: Diagnosis not present

## 2023-11-12 DIAGNOSIS — H409 Unspecified glaucoma: Secondary | ICD-10-CM | POA: Diagnosis not present

## 2023-11-12 DIAGNOSIS — K5909 Other constipation: Secondary | ICD-10-CM | POA: Diagnosis not present

## 2023-11-12 DIAGNOSIS — I10 Essential (primary) hypertension: Secondary | ICD-10-CM | POA: Diagnosis not present

## 2023-11-12 DIAGNOSIS — J479 Bronchiectasis, uncomplicated: Secondary | ICD-10-CM | POA: Diagnosis not present

## 2023-11-14 ENCOUNTER — Other Ambulatory Visit: Payer: Self-pay | Admitting: Physician Assistant

## 2023-11-14 DIAGNOSIS — H0102B Squamous blepharitis left eye, upper and lower eyelids: Secondary | ICD-10-CM | POA: Diagnosis not present

## 2023-11-14 DIAGNOSIS — H43812 Vitreous degeneration, left eye: Secondary | ICD-10-CM | POA: Diagnosis not present

## 2023-11-14 DIAGNOSIS — H401233 Low-tension glaucoma, bilateral, severe stage: Secondary | ICD-10-CM | POA: Diagnosis not present

## 2023-11-14 DIAGNOSIS — H0102A Squamous blepharitis right eye, upper and lower eyelids: Secondary | ICD-10-CM | POA: Diagnosis not present

## 2023-11-14 DIAGNOSIS — Z961 Presence of intraocular lens: Secondary | ICD-10-CM | POA: Diagnosis not present

## 2023-11-14 NOTE — Care Plan (Signed)
 Ortho Bundle Case Management Note  Patient Details  Name: Danny Gross MRN: [id number] Date of Birth: Oct 19, 1934   [facility] RNCM call to patient today and discussed his upcoming Left total hip replacement with Dr. Jerri on 11/18/23 at [facility]. Patient had other hip replacement done about a year ago with us  and is familiar with post op instructions. Reviewed briefly with his wife and sent email with post op instructions to review prior to surgery. He anticipates staying overnight in hospital and coming home with assistance from his wife. He already has DME needed (RW) in the home. Anticipate HHPT will be needed after short hospital stay. Referral made to [facility] after patient's request. Will continue to follow for needs.                 DME Arranged:   (NONE) DME Agency:     HH Arranged:  PT HH Agency:  [facility]  Additional Comments: Please contact me with any questions of if this plan should need to change.  Tylene Ned, RN, BSN, [employer]  [phone] 11/14/2023, 1:22 PM

## 2023-11-15 DIAGNOSIS — M1612 Unilateral primary osteoarthritis, left hip: Secondary | ICD-10-CM | POA: Diagnosis not present

## 2023-11-17 DIAGNOSIS — M1612 Unilateral primary osteoarthritis, left hip: Secondary | ICD-10-CM | POA: Insufficient documentation

## 2023-11-18 ENCOUNTER — Ambulatory Visit (HOSPITAL_COMMUNITY)

## 2023-11-18 ENCOUNTER — Observation Stay (HOSPITAL_COMMUNITY)

## 2023-11-18 ENCOUNTER — Encounter (HOSPITAL_COMMUNITY): Payer: Self-pay | Admitting: Orthopaedic Surgery

## 2023-11-18 ENCOUNTER — Observation Stay (HOSPITAL_COMMUNITY)
Admission: RE | Admit: 2023-11-18 | Discharge: 2023-11-19 | Disposition: A | Discharge status: Home / Self Care | Source: Ambulatory Visit | Attending: Orthopaedic Surgery | Admitting: Orthopaedic Surgery

## 2023-11-18 ENCOUNTER — Ambulatory Visit (HOSPITAL_COMMUNITY): Payer: Self-pay | Admitting: Physician Assistant

## 2023-11-18 ENCOUNTER — Other Ambulatory Visit: Payer: Self-pay

## 2023-11-18 ENCOUNTER — Encounter (HOSPITAL_COMMUNITY)
Admission: RE | Disposition: A | Discharge status: Home / Self Care | Payer: Self-pay | Source: Ambulatory Visit | Attending: Orthopaedic Surgery

## 2023-11-18 DIAGNOSIS — I10 Essential (primary) hypertension: Secondary | ICD-10-CM | POA: Diagnosis not present

## 2023-11-18 DIAGNOSIS — F1090 Alcohol use, unspecified, uncomplicated: Secondary | ICD-10-CM | POA: Diagnosis not present

## 2023-11-18 DIAGNOSIS — Z7982 Long term (current) use of aspirin: Secondary | ICD-10-CM | POA: Diagnosis not present

## 2023-11-18 DIAGNOSIS — Z79899 Other long term (current) drug therapy: Secondary | ICD-10-CM | POA: Insufficient documentation

## 2023-11-18 DIAGNOSIS — Z96642 Presence of left artificial hip joint: Secondary | ICD-10-CM | POA: Insufficient documentation

## 2023-11-18 DIAGNOSIS — M1612 Unilateral primary osteoarthritis, left hip: Secondary | ICD-10-CM | POA: Diagnosis not present

## 2023-11-18 DIAGNOSIS — J449 Chronic obstructive pulmonary disease, unspecified: Secondary | ICD-10-CM | POA: Insufficient documentation

## 2023-11-18 DIAGNOSIS — M160 Bilateral primary osteoarthritis of hip: Principal | ICD-10-CM

## 2023-11-18 HISTORY — PX: TOTAL HIP ARTHROPLASTY: SHX124

## 2023-11-18 SURGERY — ARTHROPLASTY, HIP, TOTAL, ANTERIOR APPROACH
Anesthesia: Spinal | Site: Hip | Laterality: Left

## 2023-11-18 MED ORDER — POLYETHYLENE GLYCOL 3350 17 G PO PACK
17.0000 g | PACK | Freq: Every day | ORAL | Status: DC
  Administered 2023-11-18 – 2023-11-19 (×2): 17 g via ORAL
  Filled 2023-11-18 (×2): qty 1

## 2023-11-18 MED ORDER — DORZOLAMIDE HCL 2 % OP SOLN
1.0000 [drp] | Freq: Two times a day (BID) | OPHTHALMIC | Status: DC
  Administered 2023-11-18 – 2023-11-19 (×2): 1 [drp] via OPHTHALMIC
  Filled 2023-11-18: qty 10

## 2023-11-18 MED ORDER — LIDOCAINE 2% (20 MG/ML) 5 ML SYRINGE
INTRAMUSCULAR | Status: DC | PRN
  Administered 2023-11-18: 60 mg via INTRAVENOUS

## 2023-11-18 MED ORDER — BUPIVACAINE-MELOXICAM ER 400-12 MG/14ML IJ SOLN
INTRAMUSCULAR | Status: DC | PRN
  Administered 2023-11-18: 400 mg

## 2023-11-18 MED ORDER — METOCLOPRAMIDE HCL 5 MG/ML IJ SOLN: 5.0000 mg | Freq: Three times a day (TID) | INTRAMUSCULAR | Status: DC | PRN

## 2023-11-18 MED ORDER — PHENOL 1.4 % MT LIQD: 1.0000 | OROMUCOSAL | Status: DC | PRN

## 2023-11-18 MED ORDER — VANCOMYCIN HCL 1000 MG IV SOLR
INTRAVENOUS | Status: AC
  Filled 2023-11-18: qty 20

## 2023-11-18 MED ORDER — PROPOFOL 10 MG/ML IV BOLUS
INTRAVENOUS | Status: AC
Start: 2023-11-18 — End: 2023-11-18
  Filled 2023-11-18: qty 20

## 2023-11-18 MED ORDER — OXYCODONE HCL 5 MG/5ML PO SOLN: 5.0000 mg | Freq: Once | ORAL | Status: DC | PRN

## 2023-11-18 MED ORDER — OXYCODONE HCL 5 MG PO TABS: 5.0000 mg | ORAL_TABLET | Freq: Once | ORAL | Status: DC | PRN

## 2023-11-18 MED ORDER — LACTATED RINGERS IV SOLN: INTRAVENOUS | Status: DC

## 2023-11-18 MED ORDER — CEFAZOLIN SODIUM-DEXTROSE 2-4 GM/100ML-% IV SOLN
2.0000 g | Freq: Four times a day (QID) | INTRAVENOUS | Status: AC
  Administered 2023-11-18 (×2): 2 g via INTRAVENOUS
  Filled 2023-11-18 (×2): qty 100

## 2023-11-18 MED ORDER — BUPIVACAINE IN DEXTROSE 0.75-8.25 % IT SOLN
INTRATHECAL | Status: DC | PRN
  Administered 2023-11-18: 2 mL via INTRATHECAL

## 2023-11-18 MED ORDER — MENTHOL 3 MG MT LOZG: 1.0000 | LOZENGE | OROMUCOSAL | Status: DC | PRN

## 2023-11-18 MED ORDER — DIPHENHYDRAMINE HCL 50 MG/ML IJ SOLN
6.2500 mg | Freq: Once | INTRAMUSCULAR | Status: AC
  Administered 2023-11-18: 6.5 mg via INTRAVENOUS

## 2023-11-18 MED ORDER — METHOCARBAMOL 1000 MG/10ML IJ SOLN
500.0000 mg | Freq: Four times a day (QID) | INTRAMUSCULAR | Status: DC | PRN
Start: 2023-11-18 — End: 2023-11-19

## 2023-11-18 MED ORDER — ACETAMINOPHEN 500 MG PO TABS
1000.0000 mg | ORAL_TABLET | Freq: Four times a day (QID) | ORAL | Status: AC
  Administered 2023-11-18 – 2023-11-19 (×3): 1000 mg via ORAL
  Filled 2023-11-18 (×2): qty 2

## 2023-11-18 MED ORDER — MAGNESIUM CITRATE PO SOLN: 1.0000 | Freq: Once | ORAL | Status: DC | PRN

## 2023-11-18 MED ORDER — BUPIVACAINE-MELOXICAM ER 400-12 MG/14ML IJ SOLN
INTRAMUSCULAR | Status: AC
Start: 2023-11-18 — End: 2023-11-18
  Filled 2023-11-18: qty 1

## 2023-11-18 MED ORDER — PANTOPRAZOLE SODIUM 40 MG PO TBEC
40.0000 mg | DELAYED_RELEASE_TABLET | Freq: Every day | ORAL | Status: DC
  Administered 2023-11-19: 40 mg via ORAL
  Filled 2023-11-18 (×2): qty 1

## 2023-11-18 MED ORDER — TRANEXAMIC ACID-NACL 1000-0.7 MG/100ML-% IV SOLN
1000.0000 mg | Freq: Once | INTRAVENOUS | Status: AC
  Administered 2023-11-18: 1000 mg via INTRAVENOUS
  Filled 2023-11-18: qty 100

## 2023-11-18 MED ORDER — FENTANYL CITRATE (PF) 250 MCG/5ML IJ SOLN
INTRAMUSCULAR | Status: AC
  Filled 2023-11-18: qty 5

## 2023-11-18 MED ORDER — ORAL CARE MOUTH RINSE
15.0000 mL | Freq: Once | OROMUCOSAL | Status: AC
Start: 2023-11-18 — End: 2023-11-18

## 2023-11-18 MED ORDER — ASPIRIN 81 MG PO CHEW
81.0000 mg | CHEWABLE_TABLET | Freq: Two times a day (BID) | ORAL | Status: DC
  Administered 2023-11-18 – 2023-11-19 (×2): 81 mg via ORAL
  Filled 2023-11-18 (×2): qty 1

## 2023-11-18 MED ORDER — PROPOFOL 10 MG/ML IV BOLUS
INTRAVENOUS | Status: DC | PRN
  Administered 2023-11-18: 50 ug/kg/min via INTRAVENOUS
  Administered 2023-11-18: 40 mg via INTRAVENOUS

## 2023-11-18 MED ORDER — EPHEDRINE SULFATE-NACL 50-0.9 MG/10ML-% IV SOSY
PREFILLED_SYRINGE | INTRAVENOUS | Status: DC | PRN
  Administered 2023-11-18 (×2): 5 mg via INTRAVENOUS

## 2023-11-18 MED ORDER — TRANEXAMIC ACID-NACL 1000-0.7 MG/100ML-% IV SOLN
INTRAVENOUS | Status: AC
Start: 2023-11-18 — End: 2023-11-18
  Filled 2023-11-18: qty 100

## 2023-11-18 MED ORDER — EPHEDRINE 5 MG/ML INJ
INTRAVENOUS | Status: AC
  Filled 2023-11-18: qty 5

## 2023-11-18 MED ORDER — TRANEXAMIC ACID-NACL 1000-0.7 MG/100ML-% IV SOLN
1000.0000 mg | INTRAVENOUS | Status: AC
  Administered 2023-11-18: 1000 mg via INTRAVENOUS
  Filled 2023-11-18: qty 100

## 2023-11-18 MED ORDER — ACETAMINOPHEN 325 MG PO TABS: 325.0000 mg | ORAL_TABLET | Freq: Four times a day (QID) | ORAL | Status: DC | PRN

## 2023-11-18 MED ORDER — ALUM & MAG HYDROXIDE-SIMETH 200-200-20 MG/5ML PO SUSP: 30.0000 mL | ORAL | Status: DC | PRN

## 2023-11-18 MED ORDER — ONDANSETRON HCL 4 MG/2ML IJ SOLN
INTRAMUSCULAR | Status: DC | PRN
  Administered 2023-11-18: 4 mg via INTRAVENOUS

## 2023-11-18 MED ORDER — DOCUSATE SODIUM 100 MG PO CAPS
100.0000 mg | ORAL_CAPSULE | Freq: Two times a day (BID) | ORAL | Status: DC
  Administered 2023-11-18 – 2023-11-19 (×2): 100 mg via ORAL
  Filled 2023-11-18 (×3): qty 1

## 2023-11-18 MED ORDER — PHENYLEPHRINE 80 MCG/ML (10ML) SYRINGE FOR IV PUSH (FOR BLOOD PRESSURE SUPPORT)
PREFILLED_SYRINGE | INTRAVENOUS | Status: AC
Start: 2023-11-18 — End: 2023-11-18
  Filled 2023-11-18: qty 10

## 2023-11-18 MED ORDER — CHLORHEXIDINE GLUCONATE 0.12 % MT SOLN
15.0000 mL | Freq: Once | OROMUCOSAL | Status: AC
  Administered 2023-11-18: 15 mL via OROMUCOSAL
  Filled 2023-11-18: qty 15

## 2023-11-18 MED ORDER — PRONTOSAN WOUND IRRIGATION OPTIME
TOPICAL | Status: DC | PRN
  Administered 2023-11-18: 500 mL via TOPICAL

## 2023-11-18 MED ORDER — SORBITOL 70 % SOLN: 30.0000 mL | Freq: Every day | Status: DC | PRN

## 2023-11-18 MED ORDER — DIPHENHYDRAMINE HCL 12.5 MG/5ML PO ELIX
25.0000 mg | ORAL_SOLUTION | ORAL | Status: DC | PRN
Start: 2023-11-18 — End: 2023-11-19

## 2023-11-18 MED ORDER — POVIDONE-IODINE 10 % EX SWAB: 2.0000 | Freq: Once | CUTANEOUS | Status: DC

## 2023-11-18 MED ORDER — VANCOMYCIN HCL 1 G IV SOLR
INTRAVENOUS | Status: DC | PRN
  Administered 2023-11-18: 1000 mg via TOPICAL

## 2023-11-18 MED ORDER — ONDANSETRON HCL 4 MG/2ML IJ SOLN: 4.0000 mg | Freq: Four times a day (QID) | INTRAMUSCULAR | Status: DC | PRN

## 2023-11-18 MED ORDER — MEPERIDINE HCL 25 MG/ML IJ SOLN: 6.2500 mg | INTRAMUSCULAR | Status: DC | PRN

## 2023-11-18 MED ORDER — SODIUM CHLORIDE 0.9 % IV SOLN: INTRAVENOUS | Status: DC

## 2023-11-18 MED ORDER — METOCLOPRAMIDE HCL 5 MG PO TABS: 5.0000 mg | ORAL_TABLET | Freq: Three times a day (TID) | ORAL | Status: DC | PRN

## 2023-11-18 MED ORDER — METHOCARBAMOL 500 MG PO TABS
500.0000 mg | ORAL_TABLET | Freq: Four times a day (QID) | ORAL | Status: DC | PRN
Start: 2023-11-18 — End: 2023-11-19

## 2023-11-18 MED ORDER — HYDROMORPHONE HCL 1 MG/ML IJ SOLN: 0.2500 mg | INTRAMUSCULAR | Status: DC | PRN

## 2023-11-18 MED ORDER — DEXAMETHASONE SOD PHOSPHATE PF 10 MG/ML IJ SOLN
10.0000 mg | Freq: Once | INTRAMUSCULAR | Status: AC
  Administered 2023-11-19: 10 mg via INTRAVENOUS

## 2023-11-18 MED ORDER — AMLODIPINE BESYLATE 5 MG PO TABS
2.5000 mg | ORAL_TABLET | Freq: Every day | ORAL | Status: DC
  Administered 2023-11-18 – 2023-11-19 (×2): 2.5 mg via ORAL
  Filled 2023-11-18 (×2): qty 1

## 2023-11-18 MED ORDER — HYDROCODONE-ACETAMINOPHEN 5-325 MG PO TABS
1.0000 | ORAL_TABLET | Freq: Three times a day (TID) | ORAL | Status: DC | PRN
  Administered 2023-11-19: 1 via ORAL
  Filled 2023-11-18: qty 1

## 2023-11-18 MED ORDER — LIDOCAINE 2% (20 MG/ML) 5 ML SYRINGE
INTRAMUSCULAR | Status: AC
  Filled 2023-11-18: qty 5

## 2023-11-18 MED ORDER — ONDANSETRON HCL 4 MG PO TABS: 4.0000 mg | ORAL_TABLET | Freq: Four times a day (QID) | ORAL | Status: DC | PRN

## 2023-11-18 MED ORDER — ACETAMINOPHEN 10 MG/ML IV SOLN: 1000.0000 mg | Freq: Once | INTRAVENOUS | Status: DC | PRN

## 2023-11-18 MED ORDER — SODIUM CHLORIDE (PF) 0.9 % IJ SOLN
INTRAMUSCULAR | Status: AC
  Filled 2023-11-18: qty 50

## 2023-11-18 MED ORDER — DIPHENHYDRAMINE HCL 50 MG/ML IJ SOLN
INTRAMUSCULAR | Status: AC
  Filled 2023-11-18: qty 1

## 2023-11-18 MED ORDER — PHENYLEPHRINE 80 MCG/ML (10ML) SYRINGE FOR IV PUSH (FOR BLOOD PRESSURE SUPPORT)
PREFILLED_SYRINGE | INTRAVENOUS | Status: DC | PRN
  Administered 2023-11-18 (×3): 80 ug via INTRAVENOUS

## 2023-11-18 MED ORDER — DEXAMETHASONE SOD PHOSPHATE PF 10 MG/ML IJ SOLN
INTRAMUSCULAR | Status: DC | PRN
  Administered 2023-11-18: 10 mg via INTRAVENOUS

## 2023-11-18 MED ORDER — BUPIVACAINE LIPOSOME 1.3 % IJ SUSP
INTRAMUSCULAR | Status: AC
Start: 2023-11-18 — End: 2023-11-18
  Filled 2023-11-18: qty 20

## 2023-11-18 MED ORDER — FENTANYL CITRATE (PF) 250 MCG/5ML IJ SOLN
INTRAMUSCULAR | Status: DC | PRN
  Administered 2023-11-18: 50 ug via INTRAVENOUS

## 2023-11-18 MED ORDER — ONDANSETRON HCL 4 MG/2ML IJ SOLN
INTRAMUSCULAR | Status: AC
  Filled 2023-11-18: qty 2

## 2023-11-18 MED ORDER — MORPHINE SULFATE (PF) 2 MG/ML IV SOLN: 0.5000 mg | Freq: Four times a day (QID) | INTRAVENOUS | Status: DC | PRN

## 2023-11-18 MED ORDER — DROPERIDOL 2.5 MG/ML IJ SOLN: 0.6250 mg | Freq: Once | INTRAMUSCULAR | Status: DC | PRN

## 2023-11-18 MED ORDER — TRANEXAMIC ACID 1000 MG/10ML IV SOLN
INTRAVENOUS | Status: DC | PRN
  Administered 2023-11-18: 2000 mg via TOPICAL

## 2023-11-18 MED ORDER — BUPIVACAINE-EPINEPHRINE (PF) 0.25% -1:200000 IJ SOLN
INTRAMUSCULAR | Status: AC
Start: 2023-11-18 — End: 2023-11-18
  Filled 2023-11-18: qty 30

## 2023-11-18 MED ORDER — HYDROCODONE-ACETAMINOPHEN 7.5-325 MG PO TABS
1.0000 | ORAL_TABLET | Freq: Three times a day (TID) | ORAL | Status: DC | PRN
  Administered 2023-11-18: 2 via ORAL
  Filled 2023-11-18: qty 2

## 2023-11-18 MED ORDER — CEFAZOLIN SODIUM-DEXTROSE 2-4 GM/100ML-% IV SOLN
2.0000 g | INTRAVENOUS | Status: AC
  Administered 2023-11-18: 2 g via INTRAVENOUS
  Filled 2023-11-18: qty 100

## 2023-11-18 MED ORDER — TRANEXAMIC ACID 1000 MG/10ML IV SOLN
2000.0000 mg | INTRAVENOUS | Status: DC
  Filled 2023-11-18: qty 20

## 2023-11-18 SURGICAL SUPPLY — 51 items
BAG COUNTER SPONGE SURGICOUNT (BAG) ×2 IMPLANT
BAG DECANTER FOR FLEXI CONT (MISCELLANEOUS) ×2 IMPLANT
BALL HIP ARTICU EZE 36 8.5 (Hips) IMPLANT
BLADE SAG 18X100X1.27 (BLADE) ×2 IMPLANT
COVER PERINEAL POST (MISCELLANEOUS) ×2 IMPLANT
COVER SURGICAL LIGHT HANDLE (MISCELLANEOUS) ×2 IMPLANT
CUP ACETAB W/GRIPTION 54 (Plate) IMPLANT
DERMABOND ADVANCED .7 DNX12 (GAUZE/BANDAGES/DRESSINGS) IMPLANT
DRAPE C-ARM 42X72 X-RAY (DRAPES) ×2 IMPLANT
DRAPE POUCH INSTRU U-SHP 10X18 (DRAPES) ×2 IMPLANT
DRAPE STERI IOBAN 125X83 (DRAPES) ×2 IMPLANT
DRAPE U-SHAPE 47X51 STRL (DRAPES) ×4 IMPLANT
DRESSING AQUACEL AG SP 3.5X10 (GAUZE/BANDAGES/DRESSINGS) IMPLANT
DRSG AQUACEL AG ADV 3.5X10 (GAUZE/BANDAGES/DRESSINGS) ×2 IMPLANT
DURAPREP 26ML APPLICATOR (WOUND CARE) ×4 IMPLANT
ELECTRODE BLDE 4.0 EZ CLN MEGD (MISCELLANEOUS) ×2 IMPLANT
ELECTRODE REM PT RTRN 9FT ADLT (ELECTROSURGICAL) ×2 IMPLANT
GLOVE BIOGEL PI IND STRL 7.0 (GLOVE) ×4 IMPLANT
GLOVE BIOGEL PI IND STRL 7.5 (GLOVE) ×2 IMPLANT
GLOVE ECLIPSE 7.0 STRL STRAW (GLOVE) ×10 IMPLANT
GLOVE INDICATOR 7.0 STRL GRN (GLOVE) ×2 IMPLANT
GLOVE SURG SYN 7.5 PF PI (GLOVE) ×10 IMPLANT
GOWN STRL REUS W/ TWL LRG LVL3 (GOWN DISPOSABLE) IMPLANT
GOWN STRL REUS W/ TWL XL LVL3 (GOWN DISPOSABLE) ×2 IMPLANT
GOWN STRL SURGICAL XL XLNG (GOWN DISPOSABLE) ×2 IMPLANT
GOWN TOGA ZIPPER T7+ PEEL AWAY (MISCELLANEOUS) ×2 IMPLANT
HOOD PEEL AWAY T7 (MISCELLANEOUS) ×2 IMPLANT
IV 0.9% NACL 1000 ML (IV SOLUTION) ×2 IMPLANT
KIT BASIN OR (CUSTOM PROCEDURE TRAY) ×2 IMPLANT
LINER NEUTRAL 54X36MM PLUS 4 (Hips) IMPLANT
MARKER SKIN DUAL TIP RULER LAB (MISCELLANEOUS) ×2 IMPLANT
NDL SPNL 18GX3.5 QUINCKE PK (NEEDLE) ×2 IMPLANT
NEEDLE SPNL 18GX3.5 QUINCKE PK (NEEDLE) ×1 IMPLANT
PACK TOTAL JOINT (CUSTOM PROCEDURE TRAY) ×2 IMPLANT
PACK UNIVERSAL I (CUSTOM PROCEDURE TRAY) ×2 IMPLANT
PENCIL BUTTON HOLSTER BLD 10FT (ELECTRODE) IMPLANT
SCREW 6.5MMX25MM (Screw) IMPLANT
SCREW 6.5MMX30MM (Screw) IMPLANT
SET HNDPC FAN SPRY TIP SCT (DISPOSABLE) ×2 IMPLANT
SOLUTION PRONTOSAN WOUND 350ML (IRRIGATION / IRRIGATOR) ×2 IMPLANT
STEM FEMORAL SZ6 HIGH ACTIS (Stem) IMPLANT
SUT ETHIBOND 2 V 37 (SUTURE) ×2 IMPLANT
SUT STRATAFIX PDS+ 0 24IN (SUTURE) IMPLANT
SUT VIC AB 0 CT1 27XBRD ANBCTR (SUTURE) ×2 IMPLANT
SUT VIC AB 1 CTX36XBRD ANBCTR (SUTURE) ×2 IMPLANT
SUT VIC AB 2-0 CT1 TAPERPNT 27 (SUTURE) ×4 IMPLANT
SYR 30ML LL (SYRINGE) ×4 IMPLANT
TOWEL GREEN STERILE (TOWEL DISPOSABLE) ×2 IMPLANT
TRAY FOLEY W/BAG SLVR 16FR ST (SET/KITS/TRAYS/PACK) IMPLANT
TUBE SUCT ARGYLE STRL (TUBING) ×2 IMPLANT
YANKAUER SUCT BULB TIP NO VENT (SUCTIONS) ×2 IMPLANT

## 2023-11-18 NOTE — Discharge Instructions (Signed)

## 2023-11-18 NOTE — Care Management Obs Status (Signed)
 MEDICARE OBSERVATION STATUS NOTIFICATION   Patient Details  Name: TAYSHAWN PURNELL MRN: [id number] Date of Birth: 11-Mar-1934   Medicare Observation Status Notification Given:  Yes   verbally reviewed observation notice with Lige Spalding telephonically at [phone].  Will deliver a copy to the patients room.   Yanely Mast 11/18/2023, 12:43 PM

## 2023-11-18 NOTE — Transfer of Care (Signed)
 Immediate Anesthesia Transfer of Care Note  Patient: Danny Gross  Procedure(s) Performed: ARTHROPLASTY, HIP, TOTAL, ANTERIOR APPROACH (Left: Hip)  Patient Location: PACU  Anesthesia Type:MAC and Spinal  Level of Consciousness: awake, alert , and sedated  Airway & Oxygen Therapy: Patient Spontanous Breathing and Patient connected to face mask oxygen  Post-op Assessment: Report given to RN and Post -op Vital signs reviewed and stable  Post vital signs: Reviewed and stable  Last Vitals:  Vitals Value Taken Time  BP 126/75 11/18/23 09:00  Temp    Pulse 74 11/18/23 09:01  Resp 19 11/18/23 09:01  SpO2 100 % 11/18/23 09:01  Vitals shown include unfiled device data.  Last Pain:  Vitals:   11/18/23 0646  TempSrc:   PainSc: 0-No pain         Complications: No notable events documented.

## 2023-11-18 NOTE — Anesthesia Postprocedure Evaluation (Signed)
 Anesthesia Post Note  Patient: Danny Gross  Procedure(s) Performed: ARTHROPLASTY, HIP, TOTAL, ANTERIOR APPROACH (Left: Hip)     Patient location during evaluation: PACU Anesthesia Type: Spinal Level of consciousness: oriented and awake and alert Pain management: pain level controlled Vital Signs Assessment: post-procedure vital signs reviewed and stable Respiratory status: spontaneous breathing, respiratory function stable and patient connected to nasal cannula oxygen Cardiovascular status: blood pressure returned to baseline and stable Postop Assessment: no headache, no backache and no apparent nausea or vomiting Anesthetic complications: no   No notable events documented.  Last Vitals:  Vitals:   11/18/23 1149 11/18/23 1202  BP:  (!) 150/83  Pulse: 75 85  Resp: 12 18  Temp: (!) 36.1 C (!) 36.4 C  SpO2: 98% 93%    Last Pain:  Vitals:   11/18/23 1202  TempSrc: Oral  PainSc:                  Shyla Gayheart D Anahis Furgeson

## 2023-11-18 NOTE — Anesthesia Preprocedure Evaluation (Addendum)
 Anesthesia Evaluation  Patient identified by MRN, date of birth, ID band Patient awake    Reviewed: Allergy & Precautions, NPO status , Patient's Chart, lab work & pertinent test results  Airway Mallampati: I  TM Distance: >3 FB Neck ROM: Full    Dental  (+) Teeth Intact, Dental Advisory Given   Pulmonary pneumonia, resolved, COPD   breath sounds clear to auscultation       Cardiovascular hypertension, Pt. on medications  Rhythm:Regular Rate:Normal     Neuro/Psych negative neurological ROS  negative psych ROS   GI/Hepatic negative GI ROS, Neg liver ROS,,,  Endo/Other  negative endocrine ROS    Renal/GU negative Renal ROS     Musculoskeletal  (+) Arthritis ,    Abdominal   Peds  Hematology negative hematology ROS (+)   Anesthesia Other Findings   Reproductive/Obstetrics                              Anesthesia Physical Anesthesia Plan  ASA: 3  Anesthesia Plan: Spinal   Post-op Pain Management: Tylenol  PO (pre-op)*   Induction: Intravenous  PONV Risk Score and Plan: 2 and Ondansetron  and Propofol  infusion  Airway Management Planned: Nasal Cannula and Natural Airway  Additional Equipment: None  Intra-op Plan:   Post-operative Plan:   Informed Consent: I have reviewed the patients History and Physical, chart, labs and discussed the procedure including the risks, benefits and alternatives for the proposed anesthesia with the patient or authorized representative who has indicated his/her understanding and acceptance.       Plan Discussed with: CRNA  Anesthesia Plan Comments: (Lab Results      Component                Value               Date                      WBC                      6.6                 11/11/2023                HGB                      14.0                11/11/2023                HCT                      43.8                11/11/2023                 MCV                      91.4                11/11/2023                PLT                      124 (L)             11/11/2023           )  Anesthesia Quick Evaluation

## 2023-11-18 NOTE — Op Note (Signed)
 ARTHROPLASTY, HIP, TOTAL, ANTERIOR APPROACH  Procedure Note Danny Gross   [id number]  Pre-op Diagnosis: left hip osteoarthritis     Post-op Diagnosis: same  Operative Findings Severe OA   Operative Procedures  1. Total hip replacement; Left hip; uncemented [id number]   Surgeon: Kay Cummins, M.D.  Assist: Ronal Morna Grave, PA-C   Anesthesia: spinal  Prosthesis: Depuy Acetabulum: Pinnacle 54 mm Femur: Actis 6 HO Head: 36 mm size: +8.5 Liner: +4 Bearing Type: metal/poly  Total Hip Arthroplasty (Anterior Approach) Op Note:  After informed consent was obtained and the operative extremity marked in the holding area, the patient was brought back to the operating room and placed supine on the HANA table. Next, the operative extremity was prepped and draped in normal sterile fashion. Surgical timeout occurred verifying patient identification, surgical site, surgical procedure and administration of antibiotics.  A 8 cm longitudinal incision was made starting from 2 fingerbreadths lateral and inferior to the ASIS towards the lateral aspect of the patella.  A Hueter approach to the hip was performed, using the interval between tensor fascia lata and sartorius.  Dissection was carried bluntly down onto the anterior hip capsule. The lateral femoral circumflex vessels were identified and coagulated. A capsulotomy was performed and the capsular flaps tagged for later repair.  The neck osteotomy was performed 1 fingerbreadth above the lesser trochanter. The femoral head was removed which showed severe OA, the acetabular rim was cleared of soft tissue and osteophytes and attention was turned to reaming the acetabulum.  Sequential reaming was performed under fluoroscopic guidance down to the floor of the cotyloid fossa. We reamed to a size 54 mm, and then impacted the acetabular shell.  I reamed slightly superior to find good cancellous bone since there was quite a bit of sclerotic subchondral  bone.  A 30 cancellous screw was placed to additionally secure the shell.  A +4 neutral liner was then placed after irrigation and attention turned to the femur.  After placing the femoral hook, the leg was taken to externally rotated, extended and adducted position taking care to perform soft tissue releases to allow for adequate mobilization of the femur. Soft tissue was cleared from the shoulder of the greater trochanter and the hook elevator used to improve exposure of the proximal femur.  Lateral bone from the shoulder was rasped away for relief.  Sequential broaching performed up to a size 6.  High offset trial neck and +1.5 head were placed. The leg was brought back up to neutral and the construct reduced.  The position and sizing of components, offset and leg lengths were checked using fluoroscopy.  Based on fluoroscopic findings, we chose to retrial with high offset neck and +8.5 head ball.  Stability of the construct was checked in 45 degrees of hip extension and 90 degrees of external rotation without any subluxation, shuck or impingement of prosthesis. We dislocated the prosthesis, dropped the leg back into position, removed trial components, and irrigated copiously. The final stem and head were chosen then placed, the leg brought back up, the system reduced and fluoroscopy used to verify positioning.  Antibiotic irrigation was placed in the surgical wound.   We irrigated, obtained hemostasis and closed the capsule using #2 ethibond suture.  A topical mixture of 0.25% bupivacaine  and meloxicam  was placed deep to the fascia.  One gram of vancomycin  powder was placed in the surgical bed.   One gram of topical tranexamic acid  was injected into the joint.  The  fascia was closed with #1 stratafix, the deep fat layer was closed with 0 vicryl, the subcutaneous layers closed with 2.0 Vicryl Plus and the skin closed with 2.0 nylon and dermabond. A sterile dressing was applied. The patient was awakened in the  operating room and taken to recovery in stable condition.  All sponge, needle, and instrument counts were correct at the end of the case.   Morna Grave, my PA, was a medical necessity for opening, closing, limb positioning, retracting, exposing, and overall facilitation and timely completion of the surgery.  Position: supine  Complications: see description of procedure.  Time Out: performed   Drains/Packing: none  Estimated blood loss: see anesthesia record  Returned to Recovery Room: in good condition.   Antibiotics: yes   Mechanical VTE (DVT) Prophylaxis: sequential compression devices, TED thigh-high  Chemical VTE (DVT) Prophylaxis: aspirin  81   Fluid Replacement: see anesthesia record  Specimens Removed: 1 to pathology   Sponge and Instrument Count Correct? yes   PACU: portable radiograph - low AP   Plan/RTC: Return in 2 weeks for suture removal. Weight Bearing/Load Lower Extremity: full  Hip precautions: none Suture Removal: 2 weeks   N. Ozell Cummins, MD [facility] 8:37 AM   Implant Name Type Inv. Item Serial No. Manufacturer Lot No. LRB No. Used Action  CUP ACETAB W/GRIPTION 54 - [id number] Plate CUP ACETAB W/GRIPTION 54  [employer] [id number] Left 1 Implanted  LINER NEUTRAL 54X36MM PLUS 4 - [id number] Hips LINER NEUTRAL 54X36MM PLUS 4  [employer]PU[employer] [id number] Left 1 Implanted  SCREW 6.5MMX25MM - [id number] Screw SCREW 6.5MMX25MM  [employer]PU[employer] [id number] Left 1 Implanted and Explanted  SCREW 6.5MMX30MM - [id number] Screw SCREW 6.5MMX30MM  [employer]PU[employer] [id number] Left 1 Implanted  STEM FEMORAL SZ6 HIGH ACTIS - [id number] Stem STEM FEMORAL SZ6 HIGH ACTIS  [employer] [id number] Left 1 Implanted  BALL HIP ARTICU EZE 36 8.5 - [id number] Hips BALL HIP ARTICU EZE 36 8.5  [employer] [id number] Left 1 Implanted

## 2023-11-18 NOTE — TOC CM/SW Note (Signed)
 Transition of Care [facility]) - Inpatient Brief Assessment   Patient Details  Name: Danny Gross MRN: [id number] Date of Birth: Sep 07, 1934  [facility] CM/SW Contact:    Lauraine FORBES Saa, LCSWA Phone Number: 11/18/2023, 2:00 PM   Clinical Narrative:  2:00 PM Per chart review, patient resides at home with spouse. Patient has a PCP and insurance. Patient does not have SNF history. Patient has HH history with [facility]. Patient has DME (RW, BSC) history with [facility]. Patient's preferred pharmacy is [facility] [address]. TOC consult was placed for PT/OT/SLP/DME as needed, SNF, HH/DME, and medication assistance. TOC will continue to follow.  Transition of Care Asessment: Insurance and Status: Insurance coverage has been reviewed Patient has primary care physician: Yes Home environment has been reviewed: Private Residence Prior level of function:: N/A Prior/Current Home Services: No current home services Social Drivers of Health Review: SDOH reviewed no interventions necessary Readmission risk has been reviewed: Yes (Currently Observation Status) Transition of care needs: transition of care needs identified, TOC will continue to follow

## 2023-11-18 NOTE — H&P (Signed)
 PREOPERATIVE H&P  Chief Complaint: left hip osteoarthritis  HPI: Danny Gross is a 89 y.o. male who presents for surgical treatment of left hip osteoarthritis.  He denies any changes in medical history.  Past Surgical History:  Procedure Laterality Date  . EYE SURGERY  about 2010   laser cataract stuff  . HERNIA REPAIR  01/08/2001  . TONSILLECTOMY  01/08/1937  . TOTAL HIP ARTHROPLASTY Right 09/06/2022   Procedure: TOTAL HIP ARTHROPLASTY ANTERIOR APPROACH;  Surgeon: Jerri Kay HERO, MD;  Location: [facility] OR;  Service: Orthopedics;  Laterality: Right;  3-C  . VIDEO BRONCHOSCOPY Bilateral 08/11/2013   Procedure: VIDEO BRONCHOSCOPY WITH FLUORO;  Surgeon: Ozell KATHEE America, MD;  Location: [facility] ENDOSCOPY;  Service: Cardiopulmonary;  Laterality: Bilateral;   Social History   Socioeconomic History  . Marital status: Married    Spouse name: Not on file  . Number of children: 2  . Years of education: Not on file  . Highest education level: Doctorate  Occupational History  . Occupation: Retired  Tobacco Use  . Smoking status: Never    Passive exposure: Never  . Smokeless tobacco: Never  . Tobacco comments:    never smoked  Vaping Use  . Vaping status: Never Used  Substance and Sexual Activity  . Alcohol use: Yes    Alcohol/week: 1.0 standard drink of alcohol    Types: 1 Shots of liquor per week    Comment: 1 cocktails/week  . Drug use: No  . Sexual activity: Not Currently  Other Topics Concern  . Not on file  Social History Narrative  . Not on file   Social Drivers of Health   Financial Resource Strain: Low Risk  (03/19/2023)   Overall Financial Resource Strain (CARDIA)   . Difficulty of Paying Living Expenses: Not hard at all  Food Insecurity: No Food Insecurity (03/19/2023)   Hunger Vital Sign   . Worried About Programme Researcher, Broadcasting/film/video in the Last Year: Never true   . Ran Out of Food in the Last Year: Never true  Transportation Needs: No Transportation Needs (03/19/2023)    PRAPARE - Transportation   . Lack of Transportation (Medical): No   . Lack of Transportation (Non-Medical): No  Physical Activity: Insufficiently Active (03/19/2023)   Exercise Vital Sign   . Days of Exercise per Week: 7 days   . Minutes of Exercise per Session: 20 min  Stress: No Stress Concern Present (03/19/2023)   [employer] of Occupational Health - Occupational Stress Questionnaire   . Feeling of Stress : Not at all  Social Connections: Socially Integrated (03/19/2023)   Social Connection and Isolation Panel   . Frequency of Communication with Friends and Family: More than three times a week   . Frequency of Social Gatherings with Friends and Family: More than three times a week   . Attends Religious Services: More than 4 times per year   . Active Member of Clubs or Organizations: Yes   . Attends Banker Meetings: More than 4 times per year   . Marital Status: Married   Family History  Problem Relation Age of Onset  . Cancer Maternal Aunt        ? type    No Known Allergies Prior to Admission medications   Medication Sig Start Date End Date Taking? Authorizing Provider  amLODipine  (NORVASC ) 2.5 MG tablet Take 1 tablet (2.5 mg total) by mouth daily. Patient taking differently: Take 2.5 mg by mouth at bedtime. 04/02/23  Yes Rollene Almarie LABOR, MD  aspirin  (ASPIRIN  81) 81 MG chewable tablet Chew 1 tablet (81 mg total) by mouth 2 (two) times daily. To be taken after surgery to prevent blood clots 11/05/23   Jule Ronal CROME, PA-C  brimonidine-timolol (COMBIGAN) 0.2-0.5 % ophthalmic solution Place 1 drop into both eyes every 12 (twelve) hours.   Yes [provider]  docusate sodium  (COLACE) 100 MG capsule Take 1 capsule (100 mg total) by mouth daily as needed. 11/05/23 11/04/24  Jule Ronal CROME, PA-C  docusate sodium  (COLACE) 100 MG capsule Take 100 mg by mouth in the morning.   Yes [provider]  dorzolamide (TRUSOPT) 2 % ophthalmic solution  Place 1 drop into both eyes 2 (two) times daily.   Yes [provider]  ibuprofen (ADVIL) 200 MG tablet Take 200 mg by mouth at bedtime.   Yes [provider]  methocarbamol  (ROBAXIN ) 750 MG tablet Take 1 tablet (750 mg total) by mouth 3 (three) times daily as needed. 11/05/23   Jule Ronal CROME, PA-C  ondansetron  (ZOFRAN ) 4 MG tablet Take 1 tablet (4 mg total) by mouth every 8 (eight) hours as needed for nausea or vomiting. 11/05/23   Jule Ronal CROME, PA-C  oxyCODONE -acetaminophen  (PERCOCET) 5-325 MG tablet Take 1-2 tablets by mouth every 6 (six) hours as needed. To be taken after surgery 11/05/23   Jule Ronal CROME, PA-C  ROCKLATAN 0.02-0.005 % SOLN Place 1 drop into both eyes at bedtime. 12/05/20  Yes [provider]     Positive ROS: All other systems have been reviewed and were otherwise negative with the exception of those mentioned in the HPI and as above.  Physical Exam: General: Alert, no acute distress Cardiovascular: No pedal edema Respiratory: No cyanosis, no use of accessory musculature GI: abdomen soft Skin: No lesions in the area of chief complaint Neurologic: Sensation intact distally Psychiatric: Patient is competent for consent with normal mood and affect Lymphatic: no lymphedema  MUSCULOSKELETAL: exam stable  Assessment: left hip osteoarthritis  Plan: Plan for Procedure(s): ARTHROPLASTY, HIP, TOTAL, ANTERIOR APPROACH  The risks benefits and alternatives were discussed with the patient including but not limited to the risks of nonoperative treatment, versus surgical intervention including infection, bleeding, nerve injury,  blood clots, cardiopulmonary complications, morbidity, mortality, among others, and they were willing to proceed.   Ozell Cummins, MD 11/18/2023 5:55 AM

## 2023-11-18 NOTE — Anesthesia Procedure Notes (Signed)
 Spinal  Start time: 11/18/2023 7:24 AM End time: 11/18/2023 7:26 AM Reason for block: surgical anesthesia Staffing Performed: anesthesiologist  Anesthesiologist: Tilford Franky BIRCH, MD Performed by: Tilford Franky BIRCH, MD Authorized by: Tilford Franky BIRCH, MD   Preanesthetic Checklist Completed: patient identified, IV checked, site marked, risks and benefits discussed, surgical consent, monitors and equipment checked, pre-op evaluation and timeout performed Spinal Block Patient position: sitting Prep: DuraPrep and site prepped and draped Location: L3-4 Injection technique: single-shot Needle Needle type: Pencan  Needle gauge: 24 G Needle length: 10 cm Needle insertion depth: 10 cm Assessment Events: CSF return Additional Notes Patient tolerated well. No immediate complications.  Functioning IV was confirmed and monitors were applied. Sterile prep and drape, including hand hygiene and sterile gloves were used. The patient was positioned and the back was prepped. The skin was anesthetized with lidocaine . Free flow of clear CSF was obtained prior to injecting local anesthetic into the CSF. The spinal needle aspirated freely following injection. The needle was carefully withdrawn. The patient tolerated the procedure well.

## 2023-11-18 NOTE — Evaluation (Addendum)
 Physical Therapy Evaluation Patient Details Name: Danny Gross MRN: [id number] DOB: 23-Mar-1934 Today's Date: 11/18/2023  History of Present Illness  Pt is 89 year old presented to [facility] on 11/10 for R THA. Med hx significant for arthritis, L THA  Clinical Impression  PTA, pt independent with all mobility and ADLs and is typically fairly active as he and his wife do swing and ballroom dancing 2 nights per week. Pt demonstrates modI with bed mobility and good ability to bridge and scoot backward in bed even with HOB elevated. Able to ambulate approximately 100' with RW and CGA today, with pt demonstrating B toe-in, which he states is his baseline. Pt described some discomfort in L hip except upon initial stand, where pt described brief 8/10 stinging pain in L hip. Pt expressed understanding regarding frequency and intensity of exercise program as well as regimen for icing L hip. Generalized L LE weakness as expected POD 0 noted. Recommend HH PT upon d/c to continue improving functional mobility and return to PLOF. Acute PT to follow to continue improving tolerance with gait, stair training, and exercise program reinforcement.         If plan is discharge home, recommend the following: A little help with walking and/or transfers   Can travel by private vehicle        Equipment Recommendations None recommended by PT  Recommendations for Other Services       Functional Status Assessment Patient has had a recent decline in their functional status and demonstrates the ability to make significant improvements in function in a reasonable and predictable amount of time.     Precautions / Restrictions Precautions Precautions: Fall Recall of Precautions/Restrictions: Intact Restrictions Weight Bearing Restrictions Per Provider Order: Yes LLE Weight Bearing Per Provider Order: Weight bearing as tolerated      Mobility  Bed Mobility Overal bed mobility: Modified Independent              General bed mobility comments: No physical assist required and pt demonstrate safe strategies to get EOB. Inc time required. Able to perform multiple scoots up in the bed without physical assist and HOB elevated.    Transfers Overall transfer level: Needs assistance Equipment used: Rolling walker (2 wheels) Transfers: Sit to/from Stand Sit to Stand: Contact guard assist           General transfer comment: No physical assist to stand, just CGA for safety    Ambulation/Gait Ambulation/Gait assistance: Contact guard assist Gait Distance (Feet): 100 Feet Assistive device: Rolling walker (2 wheels) Gait Pattern/deviations: Step-through pattern Gait velocity: Dec Gait velocity interpretation: <1.8 ft/sec, indicate of risk for recurrent falls   General Gait Details: Demonstrated bilateral toe-in during gait, which he states is his baseline. No LOB or staggering noted.  Stairs            Wheelchair Mobility     Tilt Bed    Modified Rankin (Stroke Patients Only)       Balance Overall balance assessment: No apparent balance deficits (not formally assessed)                                           Pertinent Vitals/Pain Pain Assessment Pain Assessment: 0-10 Pain Score: 8  Pain Location: L hip; described discomfort in sitting, 8/10 upon initial standing, 0/10 with walking Pain Descriptors / Indicators: Discomfort, Sharp (stinging) Pain Intervention(s): Limited  activity within patient's tolerance, Monitored during session    Home Living Family/patient expects to be discharged to:: Private residence Living Arrangements: Spouse/significant other Available Help at Discharge: Family;Available 24 hours/day Type of Home: House Home Access: Stairs to enter Entrance Stairs-Rails: Left Entrance Stairs-Number of Steps: 1 (Threshold)   Home Layout: One level Home Equipment: Toilet riser;Cane - single point;Rolling Walker (2 wheels);Grab bars -  tub/shower      Prior Function Prior Level of Function : Independent/Modified Independent             Mobility Comments: Typically does ballroom and swing dancing with wife at baseline - fairly active       Extremity/Trunk Assessment   Upper Extremity Assessment Upper Extremity Assessment: Overall WFL for tasks assessed    Lower Extremity Assessment Lower Extremity Assessment: Generalized weakness    Cervical / Trunk Assessment Cervical / Trunk Assessment: Normal  Communication   Communication Communication: No apparent difficulties    Cognition Arousal: Alert Behavior During Therapy: WFL for tasks assessed/performed   PT - Cognitive impairments: No apparent impairments                         Following commands: Intact       Cueing Cueing Techniques: Verbal cues     General Comments General comments (skin integrity, edema, etc.): Pt familiar with PT and timeframe for return to function as this is his second hip replacement.    Exercises Total Joint Exercises Ankle Circles/Pumps: AROM, 5 reps, Both, Seated Quad Sets: AROM, 5 reps, Both, Supine Short Arc Quad: AROM, Left, 5 reps, Supine Heel Slides: AROM, 5 reps, Supine, Left Hip ABduction/ADduction: AAROM, Left, 5 reps, Supine Long Arc Quad: AROM, Both, 10 reps, Seated   Assessment/Plan    PT Assessment Patient needs continued PT services  PT Problem List Decreased range of motion;Decreased activity tolerance;Decreased balance;Decreased mobility;Decreased knowledge of use of DME;Decreased safety awareness;Decreased knowledge of precautions       PT Treatment Interventions DME instruction;Gait training;Stair training;Functional mobility training;Therapeutic activities;Therapeutic exercise;Balance training;Neuromuscular re-education;Patient/family education;Manual techniques;Modalities    PT Goals (Current goals can be found in the Care Plan section)  Acute Rehab PT Goals Patient Stated Goal:  to get back home PT Goal Formulation: With patient Time For Goal Achievement: 12/02/23 Potential to Achieve Goals: Good    Frequency 7X/week     Co-evaluation               AM-PAC PT 6 Clicks Mobility  Outcome Measure Help needed turning from your back to your side while in a flat bed without using bedrails?: None Help needed moving from lying on your back to sitting on the side of a flat bed without using bedrails?: None Help needed moving to and from a bed to a chair (including a wheelchair)?: A Little Help needed standing up from a chair using your arms (e.g., wheelchair or bedside chair)?: A Little Help needed to walk in hospital room?: A Little Help needed climbing 3-5 steps with a railing? : A Little 6 Click Score: 20    End of Session Equipment Utilized During Treatment: Gait belt Activity Tolerance: Patient tolerated treatment well Patient left: in bed;with call bell/phone within reach;with bed alarm set;with family/visitor present Nurse Communication: Mobility status PT Visit Diagnosis: Other abnormalities of gait and mobility (R26.89);Difficulty in walking, not elsewhere classified (R26.2)    Time: 8584-8545 PT Time Calculation (min) (ACUTE ONLY): 39 min   Charges:   PT  Evaluation $PT Eval Low Complexity: 1 Low   PT General Charges $$ ACUTE PT VISIT: 1 Visit         Zakaiya Lares, SPT   Ramesh Moan 11/18/2023, 4:04 PM

## 2023-11-18 NOTE — TOC Initial Note (Signed)
 Transition of Care [facility]) - Initial/Assessment Note    Patient Details  Name: Danny Gross MRN: [id number] Date of Birth: [birth year]  [facility] of [facility] CM/SW Contact:    Roxie KANDICE Stain, RN Phone Number: 11/18/2023, 4:51 PM  Clinical Narrative:                 Spoke to patient regarding transition needs.  Patient lives with spouse and has all needed DME. Home health was arranged by [facility] office. Amy with [facility] confirmed.  ICM (Inpatient Care Management) will continue to follow for needs.   Expected Discharge Plan: Home w Home Health Services Barriers to Discharge: Continued Medical Work up   Patient Goals and CMS Choice Patient states their goals for this hospitalization and ongoing recovery are:: return home CMS Medicare.gov Compare Post Acute Care list provided to:: Patient Choice offered to / list presented to : Patient      Expected Discharge Plan and Services     Post Acute Care Choice: Home Health, Durable Medical Equipment Living arrangements for the past 2 months: Single Family Home                 DME Arranged:  (NONE)         HH Arranged: PT HH Agency: [facility] Date [facility] Agency Contacted: 11/18/23 Time HH Agency Contacted: 1651 Representative spoke with at [facility] Agency: Amy  Prior Living Arrangements/Services Living arrangements for the past 2 months: Single Family Home Lives with:: Spouse Patient language and need for interpreter reviewed:: Yes Do you feel safe going back to the place where you live?: Yes      Need for Family Participation in Patient Care: Yes (Comment) Care giver support system in place?: Yes (comment) Current home services: DME (walker, [facility]) Criminal Activity/Legal Involvement Pertinent to Current Situation/Hospitalization: No - Comment as needed  Activities of Daily Living   ADL Screening (condition at time of admission) Independently performs ADLs?: Yes (appropriate for developmental age) Is the  patient deaf or have difficulty hearing?: No Does the patient have difficulty seeing, even when wearing glasses/contacts?: No Does the patient have difficulty concentrating, remembering, or making decisions?: No  Permission Sought/Granted                  Emotional Assessment Appearance:: Appears stated age Attitude/Demeanor/Rapport: Engaged Affect (typically observed): Accepting Orientation: : Oriented to Self, Oriented to Place, Oriented to  Time, Oriented to Situation Alcohol / Substance Use: Not Applicable Psych Involvement: No (comment)  Admission diagnosis:  Primary osteoarthritis of left hip [M16.12] Status post total replacement of left hip [Z96.642] Patient Active Problem List   Diagnosis Date Noted   Status post total replacement of left hip 11/18/2023   Primary osteoarthritis of left hip 11/17/2023   Pre-operative cardiovascular examination 06/10/2023   Essential hypertension 08/14/2022   Bilateral hip joint arthritis 03/13/2022   Thoracic aortic aneurysm 06/07/2021   Aortic atherosclerosis 07/19/2020   Bronchiectasis without complication (HCC) 04/22/2018   Impaired fasting glucose 10/04/2014   Routine general medical examination at a health care facility 12/21/2013   PCP:  Yolande Toribio KANDICE, MD Pharmacy:   [id number] [address] [facility] - [address] [place] [address] Phone: [phone] Fax: [phone]     Social Drivers of Health (SDOH) Social History: SDOH Screenings   Food Insecurity: No Food Insecurity (11/18/2023)  Housing: Low Risk  (11/18/2023)  Transportation Needs: No Transportation Needs (11/18/2023)  Utilities: Not At Risk (11/18/2023)  Alcohol Screen: Low Risk  (03/19/2023)  Depression (PHQ2-9): Low Risk  (06/10/2023)  Financial Resource Strain: Low Risk  (03/19/2023)  Physical Activity: Insufficiently Active (03/19/2023)  Social Connections: Socially Integrated (11/18/2023)  Stress: No Stress Concern Present  (03/19/2023)  Tobacco Use: Low Risk  (11/18/2023)  Health Literacy: Adequate Health Literacy (03/19/2023)   SDOH Interventions:     Readmission Risk Interventions     No data to display

## 2023-11-19 ENCOUNTER — Encounter (HOSPITAL_COMMUNITY): Payer: Self-pay | Admitting: Orthopaedic Surgery

## 2023-11-19 DIAGNOSIS — Z79899 Other long term (current) drug therapy: Secondary | ICD-10-CM | POA: Diagnosis not present

## 2023-11-19 DIAGNOSIS — F1090 Alcohol use, unspecified, uncomplicated: Secondary | ICD-10-CM | POA: Diagnosis not present

## 2023-11-19 DIAGNOSIS — Z96642 Presence of left artificial hip joint: Secondary | ICD-10-CM | POA: Diagnosis not present

## 2023-11-19 DIAGNOSIS — J449 Chronic obstructive pulmonary disease, unspecified: Secondary | ICD-10-CM | POA: Diagnosis not present

## 2023-11-19 DIAGNOSIS — M1612 Unilateral primary osteoarthritis, left hip: Secondary | ICD-10-CM | POA: Diagnosis not present

## 2023-11-19 DIAGNOSIS — I10 Essential (primary) hypertension: Secondary | ICD-10-CM | POA: Diagnosis not present

## 2023-11-19 DIAGNOSIS — Z7982 Long term (current) use of aspirin: Secondary | ICD-10-CM | POA: Diagnosis not present

## 2023-11-19 NOTE — Progress Notes (Signed)
 Physical Therapy Treatment Patient Details Name: Danny Gross MRN: [id number] DOB: 04/14/1934 Today's Date: 11/19/2023   History of Present Illness Pt is 89 year old presented to [facility] on 11/10 for R THA. Med hx significant for arthritis, L THA    PT Comments  Continuing work on functional mobility and activity tolerance;  Session focused on progressive amb and stair negotiation in prep for dc home; Walked household distance+ well, pt's wife providing good assist; Stair training complete; Questions solicited and answered; OK for dc home from PT standpoint    If plan is discharge home, recommend the following: A little help with walking and/or transfers   Can travel by private vehicle        Equipment Recommendations  None recommended by PT (Well-equipped)    Recommendations for Other Services       Precautions / Restrictions Precautions Precautions: Fall Recall of Precautions/Restrictions: Intact Restrictions LLE Weight Bearing Per Provider Order: Weight bearing as tolerated     Mobility  Bed Mobility Overal bed mobility: Modified Independent                  Transfers Overall transfer level: Needs assistance Equipment used: Rolling walker (2 wheels) Transfers: Sit to/from Stand Sit to Stand: Contact guard assist           General transfer comment: No physical assist to stand, just CGA for safety; Cues for hand placement    Ambulation/Gait Ambulation/Gait assistance: Contact guard assist Gait Distance (Feet): 240 Feet Assistive device: Rolling walker (2 wheels) Gait Pattern/deviations: Step-through pattern Gait velocity: Decr     General Gait Details: Demonstrated bilateral toe-in during gait, which he states is his baseline. No LOB or staggering noted. Cues for posture and RW proximitiy, keeps hip slightly behind RW   Stairs Stairs: Yes Stairs assistance: Min assist Stair Management: Forwards (RW on R to approximate brick wall he supports  himself on going up the step at home) Number of Stairs: 1 General stair comments: Cues for sequencing; able to verbalize sequence   Wheelchair Mobility     Tilt Bed    Modified Rankin (Stroke Patients Only)       Balance                                            Communication Communication Communication: No apparent difficulties  Cognition Arousal: Alert Behavior During Therapy: WFL for tasks assessed/performed   PT - Cognitive impairments: No apparent impairments                         Following commands: Intact      Cueing Cueing Techniques: Verbal cues, Tactile cues  Exercises      General Comments General comments (skin integrity, edema, etc.): Discussed car transfers      Pertinent Vitals/Pain Pain Assessment Pain Assessment: Faces Faces Pain Scale: Hurts little more Pain Location: L hip Pain Descriptors / Indicators: Discomfort, Grimacing Pain Intervention(s): Monitored during session    Home Living                          Prior Function            PT Goals (current goals can now be found in the care plan section) Acute Rehab PT Goals Patient Stated Goal: to get back  home PT Goal Formulation: With patient Time For Goal Achievement: 12/02/23 Potential to Achieve Goals: Good Progress towards PT goals: Progressing toward goals    Frequency    7X/week      PT Plan      Co-evaluation              AM-PAC PT 6 Clicks Mobility   Outcome Measure  Help needed turning from your back to your side while in a flat bed without using bedrails?: None Help needed moving from lying on your back to sitting on the side of a flat bed without using bedrails?: None Help needed moving to and from a bed to a chair (including a wheelchair)?: A Little Help needed standing up from a chair using your arms (e.g., wheelchair or bedside chair)?: A Little Help needed to walk in hospital room?: A Little Help needed  climbing 3-5 steps with a railing? : A Lot 6 Click Score: 19    End of Session Equipment Utilized During Treatment: Gait belt Activity Tolerance: Patient tolerated treatment well Patient left: in bed;with call bell/phone within reach;with bed alarm set;with family/visitor present Nurse Communication: Mobility status PT Visit Diagnosis: Other abnormalities of gait and mobility (R26.89);Difficulty in walking, not elsewhere classified (R26.2)     Time: 9071-8999 PT Time Calculation (min) (ACUTE ONLY): 32 min  Charges:    $Gait Training: 23-37 mins PT General Charges $$ ACUTE PT VISIT: 1 Visit                     Silvano Currier, PT  Acute Rehabilitation Services Office [phone] Secure Chat welcomed    Silvano VEAR Currier 11/19/2023, 11:24 AM

## 2023-11-19 NOTE — TOC Transition Note (Signed)
 Transition of Care [facility]) - Discharge Note   Patient Details  Name: Danny Gross MRN: [id number] Date of Birth: August 08, 1934  [facility]) CM/SW Contact:  Marval Gell, RN Phone Number: 11/19/2023, 8:53 AM   Clinical Narrative:     Notified [facility] of DC, no other needs identified     Barriers to Discharge: Continued Medical Work up   Patient Goals and CMS Choice Patient states their goals for this hospitalization and ongoing recovery are:: return home CMS Medicare.gov Compare Post Acute Care list provided to:: Patient Choice offered to / list presented to : Patient      Discharge Placement                       Discharge Plan and Services Additional resources added to the After Visit Summary for       Post Acute Care Choice: Home Health, Durable Medical Equipment          DME Arranged:  (NONE)         HH Arranged: PT HH Agency: [facility] Date [facility] Agency Contacted: 11/18/23 Time HH Agency Contacted: 1651 Representative spoke with at [facility] Agency: Amy  Social Drivers of Health (SDOH) Interventions SDOH Screenings   Food Insecurity: No Food Insecurity (11/18/2023)  Housing: Low Risk  (11/18/2023)  Transportation Needs: No Transportation Needs (11/18/2023)  Utilities: Not At Risk (11/18/2023)  Alcohol Screen: Low Risk  (03/19/2023)  Depression (PHQ2-9): Low Risk  (06/10/2023)  Financial Resource Strain: Low Risk  (03/19/2023)  Physical Activity: Insufficiently Active (03/19/2023)  Social Connections: Socially Integrated (11/18/2023)  Stress: No Stress Concern Present (03/19/2023)  Tobacco Use: Low Risk  (11/18/2023)  Health Literacy: Adequate Health Literacy (03/19/2023)     Readmission Risk Interventions     No data to display

## 2023-11-19 NOTE — Progress Notes (Signed)
 Subjective: 1 Day Post-Op Procedure(s) (LRB): ARTHROPLASTY, HIP, TOTAL, ANTERIOR APPROACH (Left) Patient reports pain as mild.    Objective: Vital signs in last 24 hours: Temp:  [94.5 F (34.7 C)-98.5 F (36.9 C)] 98.4 F (36.9 C) (11/10 2031) Pulse Rate:  [46-104] 68 (11/11 0327) Resp:  [10-20] 17 (11/10 2031) BP: (94-171)/(61-98) 171/96 (11/10 2031) SpO2:  [93 %-99 %] 96 % (11/11 0327)  Intake/Output from previous day: 11/10 0701 - 11/11 0700 In: 1245 [I.V.:845; IV Piggyback:400] Out: 1125 [Urine:1000; Blood:125] Intake/Output this shift: No intake/output data recorded.  No results for input(s): HGB in the last 72 hours. No results for input(s): WBC, RBC, HCT, PLT in the last 72 hours. No results for input(s): NA, K, CL, CO2, BUN, CREATININE, GLUCOSE, CALCIUM in the last 72 hours. No results for input(s): LABPT, INR in the last 72 hours.  Neurologically intact Neurovascular intact Sensation intact distally Intact pulses distally Dorsiflexion/Plantar flexion intact Incision: dressing C/D/I No cellulitis present Compartment soft   Assessment/Plan: 1 Day Post-Op Procedure(s) (LRB): ARTHROPLASTY, HIP, TOTAL, ANTERIOR APPROACH (Left) Advance diet Up with therapy Discharge home with home health once cleared by PT WBAT LLE      Danny Gross Grave 11/19/2023, 8:05 AM

## 2023-11-19 NOTE — Plan of Care (Signed)

## 2023-11-19 NOTE — Discharge Summary (Signed)
 Patient ID: Danny Gross MRN: [id number] DOB/AGE: 06/22/1934 89 y.o.  Admit date: 11/18/2023 Discharge date: 11/19/2023  Admission Diagnoses:  Principal Problem:   Primary osteoarthritis of left hip Active Problems:   Status post total replacement of left hip   Discharge Diagnoses:  Same  Past Medical History:  Diagnosis Date   Arthritis    Cataract    Emphysema of lung (HCC)    Glaucoma about 1990   Dr. Octavia - says severe   Hypertension    Pneumonia 2015    Surgeries: Procedure(s): ARTHROPLASTY, HIP, TOTAL, ANTERIOR APPROACH on 11/18/2023   Consultants:   Discharged Condition: Improved  Hospital Course: Danny Gross is an 89 y.o. male who was admitted 11/18/2023 for operative treatment ofPrimary osteoarthritis of left hip. Patient has severe unremitting pain that affects sleep, daily activities, and work/hobbies. After pre-op clearance the patient was taken to the operating room on 11/18/2023 and underwent  Procedure(s): ARTHROPLASTY, HIP, TOTAL, ANTERIOR APPROACH.    Patient was given perioperative antibiotics:  Anti-infectives (From admission, onward)    Start     Dose/Rate Route Frequency Ordered Stop   11/18/23 1300  ceFAZolin  (ANCEF ) IVPB 2g/100 mL premix        2 g 200 mL/hr over 30 Minutes Intravenous Every 6 hours 11/18/23 1202 11/18/23 1841   11/18/23 0900  vancomycin  (VANCOCIN ) powder  Status:  Discontinued          As needed 11/18/23 1005 11/18/23 1005   11/18/23 0600  ceFAZolin  (ANCEF ) IVPB 2g/100 mL premix        2 g 200 mL/hr over 30 Minutes Intravenous On call to O.R. 11/18/23 0559 11/18/23 0730        Patient was given sequential compression devices, early ambulation, and chemoprophylaxis to prevent DVT.  Inpatient Morphine Milligram Equivalents Per Day 11/10 - 11/11   Values displayed are in units of MME/Day    Order Start / End Date Yesterday Today    oxyCODONE  (Oxy IR/ROXICODONE ) immediate release tablet 5 mg 11/10 - 11/10 0 of  Unknown --    oxyCODONE  (ROXICODONE ) 5 MG/5ML solution 5 mg 11/10 - 11/10 0 of Unknown --      Group total: 0 of Unknown     HYDROmorphone  (DILAUDID ) injection 0.25-0.5 mg 11/10 - 11/10 0 of 40-80 --    meperidine  (DEMEROL ) injection 6.25-12.5 mg 11/10 - 11/10 0 of 7.5-15 --    HYDROcodone-acetaminophen  (NORCO/VICODIN) 5-325 MG per tablet 1-2 tablet 11/10 - No end date 0 of 10-20 0 of 15-30    HYDROcodone-acetaminophen  (NORCO) 7.5-325 MG per tablet 1-2 tablet 11/10 - No end date 15 of 15-30 0 of 22.5-45    morphine (PF) 2 MG/ML injection 0.5-1 mg 11/10 - No end date 0 of 3-6 0 of 6-12    fentaNYL citrate (PF) (SUBLIMAZE) injection 11/10 - 11/10 *15 of 15 --    Daily Totals  * 30 of Unknown (at least 90.5-166) 0 of 43.5-87  *One-Step medication  Calculation Errors     Order Type Date Details   oxyCODONE  (Oxy IR/ROXICODONE ) immediate release tablet 5 mg Ordered Dose -- Insufficient frequency information   oxyCODONE  (ROXICODONE ) 5 MG/5ML solution 5 mg Ordered Dose -- Insufficient frequency information            Patient benefited maximally from hospital stay and there were no complications.    Recent vital signs: Patient Vitals for the past 24 hrs:  BP Temp Temp src Pulse Resp SpO2  11/19/23 0327 -- --  Oral 68 -- 96 %  11/18/23 2031 (!) 171/96 98.4 F (36.9 C) Oral 72 17 97 %  11/18/23 1802 (!) 168/81 98.1 F (36.7 C) Oral 83 18 94 %  11/18/23 1432 (!) 163/98 98.5 F (36.9 C) Oral 98 18 95 %  11/18/23 1202 (!) 150/83 (!) 97.5 F (36.4 C) Oral 85 18 93 %  11/18/23 1149 -- (!) 97 F (36.1 C) -- 75 12 98 %  11/18/23 1145 133/80 (!) 96.8 F (36 C) -- 67 12 97 %  11/18/23 1130 139/85 (!) 96.4 F (35.8 C) -- 86 14 99 %  11/18/23 1115 134/71 (!) 95.9 F (35.5 C) -- (!) 59 12 98 %  11/18/23 1100 130/78 (!) 95.5 F (35.3 C) -- (!) 52 12 96 %  11/18/23 1045 130/70 (!) 95.2 F (35.1 C) -- 70 13 96 %  11/18/23 1030 (!) 140/84 (!) 94.8 F (34.9 C) -- 67 13 95 %  11/18/23 1015 133/65  (!) 94.6 F (34.8 C) -- (!) 59 16 97 %  11/18/23 1000 132/66 (!) 94.5 F (34.7 C) -- (!) 46 10 96 %  11/18/23 0945 114/62 -- -- 71 13 94 %  11/18/23 0930 102/75 -- -- 80 16 93 %  11/18/23 0915 94/61 -- -- 85 18 93 %  11/18/23 0900 126/75 -- -- (!) 104 20 98 %     Recent laboratory studies: No results for input(s): WBC, HGB, HCT, PLT, NA, K, CL, CO2, BUN, CREATININE, GLUCOSE, INR, CALCIUM in the last 72 hours.  Invalid input(s): PT, 2   Discharge Medications:   Allergies as of 11/19/2023   No Known Allergies      Medication List     STOP taking these medications    ibuprofen 200 MG tablet Commonly known as: ADVIL       TAKE these medications    amLODipine  2.5 MG tablet Commonly known as: NORVASC  Take 1 tablet (2.5 mg total) by mouth daily. What changed: when to take this   aspirin  81 MG chewable tablet Commonly known as: Aspirin  81 Chew 1 tablet (81 mg total) by mouth 2 (two) times daily. To be taken after surgery to prevent blood clots   brimonidine-timolol 0.2-0.5 % ophthalmic solution Commonly known as: COMBIGAN Place 1 drop into both eyes every 12 (twelve) hours.   docusate sodium  100 MG capsule Commonly known as: Colace Take 1 capsule (100 mg total) by mouth daily as needed. What changed: Another medication with the same name was removed. Continue taking this medication, and follow the directions you see here.   dorzolamide 2 % ophthalmic solution Commonly known as: TRUSOPT Place 1 drop into both eyes 2 (two) times daily.   methocarbamol  750 MG tablet Commonly known as: ROBAXIN  Take 1 tablet (750 mg total) by mouth 3 (three) times daily as needed.   ondansetron  4 MG tablet Commonly known as: Zofran  Take 1 tablet (4 mg total) by mouth every 8 (eight) hours as needed for nausea or vomiting.   oxyCODONE -acetaminophen  5-325 MG tablet Commonly known as: Percocet Take 1-2 tablets by mouth every 6 (six) hours as needed. To  be taken after surgery   Rocklatan 0.02-0.005 % Soln Generic drug: Netarsudil-Latanoprost Place 1 drop into both eyes at bedtime.               Durable Medical Equipment  (From admission, onward)           Start     Ordered   11/18/23 1203  DME Walker rolling  Once       Question:  Patient needs a walker to treat with the following condition  Answer:  History of hip replacement   11/18/23 1202   11/18/23 1203  DME 3 n 1  Once        11/18/23 1202   11/18/23 1203  DME Bedside commode  Once       Question:  Patient needs a bedside commode to treat with the following condition  Answer:  History of hip replacement   11/18/23 1202            Diagnostic Studies: DG Pelvis Portable Result Date: 11/18/2023 CLINICAL DATA:  Postop. EXAM: PORTABLE PELVIS 1-2 VIEWS COMPARISON:  Preoperative imaging. FINDINGS: Left hip arthroplasty in expected alignment. No periprosthetic lucency or fracture. Recent postsurgical change includes air and edema in the soft tissues. Previous right hip arthroplasty. IMPRESSION: Left hip arthroplasty without immediate postoperative complication. Electronically Signed   By: Andrea Gasman M.D.   On: 11/18/2023 10:49   DG HIP UNILAT WITH PELVIS 1V LEFT Result Date: 11/18/2023 CLINICAL DATA:  Elective surgery. EXAM: DG HIP (WITH OR WITHOUT PELVIS) 1V*L* COMPARISON:  None Available. FINDINGS: Four fluoroscopic spot views of the pelvis and left hip obtained in the operating room. Sequential images during hip arthroplasty. Fluoroscopy time 26 seconds. Dose 2.7 mGy. Previous right hip arthroplasty. IMPRESSION: Intraoperative fluoroscopy during left hip arthroplasty. Electronically Signed   By: Andrea Gasman M.D.   On: 11/18/2023 10:45   DG C-Arm 1-60 Min-No Report Result Date: 11/18/2023 Fluoroscopy was utilized by the requesting physician.  No radiographic interpretation.   DG C-Arm 1-60 Min-No Report Result Date: 11/18/2023 Fluoroscopy was utilized by  the requesting physician.  No radiographic interpretation.    Disposition: Discharge disposition: 01-Home or Self Care          Follow-up Information     Jerri Kay HERO, MD. Go on 12/03/2023.   Specialty: Orthopedic Surgery Why: at 10:45 for your first post operative appointment with Dr. Jerri Pass information: [address] [place] [id number] [phone]         [facility] Follow up.   Why: Home health has been arranged. They will contact you to schedule apt Contact information: [address] [place] [address] [phone]                  Signed: Ronal LITTIE Grave 11/19/2023, 8:09 AM

## 2023-11-20 ENCOUNTER — Telehealth: Payer: Self-pay | Admitting: *Deleted

## 2023-11-20 NOTE — Telephone Encounter (Signed)
 Discharge call to patient today. His wife answered and states that HHPT just arrived. He is doing well. Reviewed medications and states they are having no issues. Will call if have questions. Call from HHPT after visit requesting orders for HHPT 3wk2. Verbal given per Dr. Jerri.

## 2023-11-22 ENCOUNTER — Telehealth: Payer: Self-pay | Admitting: *Deleted

## 2023-11-22 NOTE — Telephone Encounter (Signed)
 Ok with me. Thank you.

## 2023-11-22 NOTE — Telephone Encounter (Signed)
 Patient called and hasn't had BM since surgery on Monday. He is taking Colace 1 x per day as well as drinking prune juice. I told him to increase his fluids and take the Colace 2 x per day. Also suggested OTC Mag citrate 1/2 bottle one day and 1/2 the next if he hasn't gone over the weekend. Didn't want to add too many things since he is passing gas and not too uncomfortable. Are you ok with this?

## 2023-11-29 ENCOUNTER — Telehealth: Payer: Self-pay | Admitting: Radiology

## 2023-11-29 NOTE — Telephone Encounter (Signed)
 Called and gave verbal

## 2023-11-29 NOTE — Telephone Encounter (Signed)
 Danny Gross is calling to get verbal for a 1 week 1 so they can see the pt on Monday due to missing an appt this week Please call her back to advise [phone]

## 2023-12-03 ENCOUNTER — Ambulatory Visit (INDEPENDENT_AMBULATORY_CARE_PROVIDER_SITE_OTHER): Admitting: Orthopaedic Surgery

## 2023-12-03 DIAGNOSIS — Z96642 Presence of left artificial hip joint: Secondary | ICD-10-CM

## 2023-12-03 NOTE — Progress Notes (Signed)
   Post-Op Visit Note   Patient: Danny Gross           Date of Birth: 05/12/1934           MRN: [id number] Visit Date: 12/03/2023 PCP: Yolande Toribio MATSU, MD   Assessment & Plan:  Chief Complaint:  Chief Complaint  Patient presents with   Left Hip - Pain    L THA-11/18/23   Visit Diagnoses:  1. Status post total replacement of left hip     Plan: Danny Gross is 2 weeks status post left total hip arthroplasty.  This is his first postop appointment.  He is doing well.  Managing pain with Tylenol .  Finished home health PT yesterday.  He is walking with a cane.  Left hip surgical incision is healed.  No signs of infection.  Sutures removed Steri-Strips applied.  Instructions reviewed.  Will continue with walking first physical therapy.  Recheck in 4 weeks with imaging.  Follow-Up Instructions: Return in about 4 weeks (around 12/31/2023) for with lindsey.   Orders:  No orders of the defined types were placed in this encounter.  No orders of the defined types were placed in this encounter.   Imaging: No results found.  PMFS History: Patient Active Problem List   Diagnosis Date Noted   Status post total replacement of left hip 11/18/2023   Primary osteoarthritis of left hip 11/17/2023   Pre-operative cardiovascular examination 06/10/2023   Essential hypertension 08/14/2022   Bilateral hip joint arthritis 03/13/2022   Thoracic aortic aneurysm 06/07/2021   Aortic atherosclerosis 07/19/2020   Bronchiectasis without complication (HCC) 04/22/2018   Impaired fasting glucose 10/04/2014   Routine general medical examination at a health care facility 12/21/2013   Past Medical History:  Diagnosis Date   Arthritis    Cataract    Emphysema of lung (HCC)    Glaucoma about 10   Dr. Octavia - says severe   Hypertension    Pneumonia 2015    Family History  Problem Relation Age of Onset   Cancer Maternal Aunt        ? type     Past Surgical History:  Procedure Laterality  Date   EYE SURGERY  about 2010   laser cataract stuff   HERNIA REPAIR  01/08/2001   TONSILLECTOMY  01/08/1937   TOTAL HIP ARTHROPLASTY Right 09/06/2022   Procedure: TOTAL HIP ARTHROPLASTY ANTERIOR APPROACH;  Surgeon: Jerri Kay HERO, MD;  Location: [facility] OR;  Service: Orthopedics;  Laterality: Right;  3-C   TOTAL HIP ARTHROPLASTY Left 11/18/2023   Procedure: ARTHROPLASTY, HIP, TOTAL, ANTERIOR APPROACH;  Surgeon: Jerri Kay HERO, MD;  Location: [facility] OR;  Service: Orthopedics;  Laterality: Left;  3-C   VIDEO BRONCHOSCOPY Bilateral 08/11/2013   Procedure: VIDEO BRONCHOSCOPY WITH FLUORO;  Surgeon: Ozell KATHEE America, MD;  Location: [facility];  Service: Cardiopulmonary;  Laterality: Bilateral;   Social History   Occupational History   Occupation: Retired  Tobacco Use   Smoking status: Never    Passive exposure: Never   Smokeless tobacco: Never   Tobacco comments:    never smoked  Vaping Use   Vaping status: Never Used  Substance and Sexual Activity   Alcohol use: Yes    Alcohol/week: 1.0 standard drink of alcohol    Types: 1 Shots of liquor per week    Comment: 1 cocktails/week   Drug use: No   Sexual activity: Not Currently

## 2023-12-31 ENCOUNTER — Other Ambulatory Visit: Payer: Self-pay

## 2023-12-31 ENCOUNTER — Ambulatory Visit: Admitting: Physician Assistant

## 2023-12-31 DIAGNOSIS — Z96642 Presence of left artificial hip joint: Secondary | ICD-10-CM

## 2023-12-31 NOTE — Progress Notes (Signed)
 "  Post-Op Visit Note   Patient: Danny Gross           Date of Birth: 1934-03-15           MRN: [id number] Visit Date: 12/31/2023 PCP: Yolande Toribio MATSU, MD   Assessment & Plan:  Chief Complaint:  Chief Complaint  Patient presents with   Left Hip - Follow-up    Left THA 11/18/2023   Visit Diagnoses:  1. Status post total replacement of left hip     Plan: Patient is a pleasant 89 year old gentleman who comes in today 6 weeks status post left total hip replacement.  He is doing well.  He has been taking occasional Tylenol  for pain.  He has been compliant taking baby aspirin  twice daily for DVT prophylaxis.  Left hip exam: Painless hip flexion logroll.  He is neurovascularly intact distally.  At this point, he will advance activity as tolerated.  Follow-up in 6 weeks for recheck.  Call with concerns or questions.  Follow-Up Instructions: Return in about 4 weeks (around 01/28/2024).   Orders:  Orders Placed This Encounter  Procedures   XR Pelvis 1-2 Views   No orders of the defined types were placed in this encounter.   Imaging: XR Pelvis 1-2 Views Result Date: 12/31/2023 Well-seated prosthesis without complication   PMFS History: Patient Active Problem List   Diagnosis Date Noted   Status post total replacement of left hip 11/18/2023   Primary osteoarthritis of left hip 11/17/2023   Pre-operative cardiovascular examination 06/10/2023   Essential hypertension 08/14/2022   Bilateral hip joint arthritis 03/13/2022   Thoracic aortic aneurysm 06/07/2021   Aortic atherosclerosis 07/19/2020   Bronchiectasis without complication (HCC) 04/22/2018   Impaired fasting glucose 10/04/2014   Routine general medical examination at a health care facility 12/21/2013   Past Medical History:  Diagnosis Date   Arthritis    Cataract    Emphysema of lung (HCC)    Glaucoma about 45   Dr. Octavia - says severe   Hypertension    Pneumonia 2015    Family History  Problem  Relation Age of Onset   Cancer Maternal Aunt        ? type     Past Surgical History:  Procedure Laterality Date   EYE SURGERY  about 2010   laser cataract stuff   HERNIA REPAIR  01/08/2001   TONSILLECTOMY  01/08/1937   TOTAL HIP ARTHROPLASTY Right 09/06/2022   Procedure: TOTAL HIP ARTHROPLASTY ANTERIOR APPROACH;  Surgeon: Jerri Kay HERO, MD;  Location: [facility];  Service: Orthopedics;  Laterality: Right;  3-C   TOTAL HIP ARTHROPLASTY Left 11/18/2023   Procedure: ARTHROPLASTY, HIP, TOTAL, ANTERIOR APPROACH;  Surgeon: Jerri Kay HERO, MD;  Location: [facility];  Service: Orthopedics;  Laterality: Left;  3-C   VIDEO BRONCHOSCOPY Bilateral 08/11/2013   Procedure: VIDEO BRONCHOSCOPY WITH FLUORO;  Surgeon: Ozell KATHEE America, MD;  Location: [facility];  Service: Cardiopulmonary;  Laterality: Bilateral;   Social History   Occupational History   Occupation: Retired  Tobacco Use   Smoking status: Never    Passive exposure: Never   Smokeless tobacco: Never   Tobacco comments:    never smoked  Vaping Use   Vaping status: Never Used  Substance and Sexual Activity   Alcohol use: Yes    Alcohol/week: 1.0 standard drink of alcohol    Types: 1 Shots of liquor per week    Comment: 1 cocktails/week   Drug use: No   Sexual  activity: Not Currently     "

## 2024-01-07 ENCOUNTER — Ambulatory Visit: Payer: Self-pay

## 2024-01-07 NOTE — Telephone Encounter (Signed)
 FYI Only or Action Required?: Action required by provider: medication refill request.  Patient was last seen in primary care on 06/10/2023 by Rollene Almarie LABOR, MD.  Called Nurse Triage reporting Covid Positive.  Symptoms began yesterday.  Interventions attempted: Nothing.  Symptoms are: unchanged.  Triage Disposition: Call PCP Within 24 Hours  Patient/caregiver understands and will follow disposition?: No, wishes to speak with PCP  Reason for Disposition  [1] Fever > 100 F (37.8 C) AND [2] bedridden (e.g., CVA, chronic illness, recovering from surgery)  Additional Information  Commented on: [1] HIGH RISK patient (e.g., weak immune system, 65 years and older, obesity with BMI 30 or higher, pregnant, chronic lung disease) AND [2] COVID symptoms (e.g., cough, fever)  (Exceptions: Already seen by PCP and no new or worsening symptoms.)    Need appt within 24 hours  Answer Assessment - Initial Assessment Questions Patient's wife declines appt and request Paxlovid .  Advised call back/UC or ED/911 if symptoms worsen. Patient's wife verbalized understanding.    1. SYMPTOMS: What is your main symptom or concern? (e.g., cough, fever, shortness of breath, muscle aches)     Cough, sneezing body aches, HA, scratchy throat 2. ONSET: When did the symptoms start?      yesterday 3. COUGH: Do you have a cough? If Yes, ask: How bad is the cough?       mild 4. FEVER: Do you have a fever? If Yes, ask: What is your temperature, how was it measured, and when did it start?     Did not check, denies fever chills,n/v 5. BREATHING DIFFICULTY: Are you having any difficulty breathing? (e.g., normal; shortness of breath, wheezing, unable to speak)      no 7. OTHER SYMPTOMS: Do you have any other symptoms?  (e.g., chills, fatigue, headache, loss of smell or taste, muscle pain, sore throat)     denies 8. COVID-19 DIAGNOSIS: How do you know that you have COVID? (e.g., positive lab test or  self-test, diagnosed by doctor or NP/PA, symptoms after exposure).     Home test positive 9. COVID-19 EXPOSURE: Was there any known exposure to COVID before the symptoms began?      family 10. COVID-19 VACCINE: Have you had the COVID-19 vaccine? If Yes, ask: When did you last get it?       yes 11. HIGH RISK DISEASE: Do you have any chronic medical problems? (e.g., asthma, heart or lung disease, weak immune system, obesity, etc.)       Lung hx; atypical TB  Protocols used: COVID-19 - Diagnosed or Suspected-A-AH

## 2024-01-16 NOTE — Telephone Encounter (Signed)
"  WRONG OFFICE.  "

## 2024-01-17 NOTE — Telephone Encounter (Signed)
 Called [facility], spoke with Rocky regarding Triage Encounter and message stating wrong office. Patient specifically called for pcp, Almarie Cleveland MD, and Nurse Triage forwarded CRM to clinic.  Rocky reports he has a different PCP, Toribio Shan.Nurse Triage again reports patient specifically requests for Dr. Cleveland and message has not been routed to MD.  LOV 06/28/2023

## 2024-04-06 ENCOUNTER — Encounter: Admitting: Internal Medicine

## 2024-04-06 ENCOUNTER — Ambulatory Visit
